# Patient Record
Sex: Female | Born: 1935 | Race: White | Hispanic: No | Smoking: Never smoker
Health system: Southern US, Community
[De-identification: ages and names within clinical notes are randomized; demographics above are authoritative.]

## PROBLEM LIST (undated history)

## (undated) DIAGNOSIS — I10 Essential (primary) hypertension: Secondary | ICD-10-CM

## (undated) DIAGNOSIS — K449 Diaphragmatic hernia without obstruction or gangrene: Secondary | ICD-10-CM

## (undated) DIAGNOSIS — J189 Pneumonia, unspecified organism: Secondary | ICD-10-CM

## (undated) DIAGNOSIS — I82409 Acute embolism and thrombosis of unspecified deep veins of unspecified lower extremity: Secondary | ICD-10-CM

## (undated) DIAGNOSIS — M1990 Unspecified osteoarthritis, unspecified site: Secondary | ICD-10-CM

## (undated) DIAGNOSIS — N200 Calculus of kidney: Secondary | ICD-10-CM

## (undated) DIAGNOSIS — I82402 Acute embolism and thrombosis of unspecified deep veins of left lower extremity: Secondary | ICD-10-CM

## (undated) DIAGNOSIS — S72002A Fracture of unspecified part of neck of left femur, initial encounter for closed fracture: Secondary | ICD-10-CM

## (undated) DIAGNOSIS — K2960 Other gastritis without bleeding: Secondary | ICD-10-CM

## (undated) DIAGNOSIS — S62109A Fracture of unspecified carpal bone, unspecified wrist, initial encounter for closed fracture: Secondary | ICD-10-CM

## (undated) DIAGNOSIS — K649 Unspecified hemorrhoids: Secondary | ICD-10-CM

## (undated) DIAGNOSIS — I509 Heart failure, unspecified: Secondary | ICD-10-CM

## (undated) HISTORY — DX: Fracture of unspecified part of neck of left femur, initial encounter for closed fracture: S72.002A

## (undated) HISTORY — PX: APPENDECTOMY: SHX54

## (undated) HISTORY — DX: Fracture of unspecified carpal bone, unspecified wrist, initial encounter for closed fracture: S62.109A

---

## 2001-11-08 ENCOUNTER — Other Ambulatory Visit: Admission: RE | Admit: 2001-11-08 | Discharge: 2001-11-08 | Payer: Self-pay | Admitting: Family Medicine

## 2001-11-12 ENCOUNTER — Encounter: Payer: Self-pay | Admitting: Family Medicine

## 2001-11-12 ENCOUNTER — Ambulatory Visit (HOSPITAL_COMMUNITY): Admission: RE | Admit: 2001-11-12 | Discharge: 2001-11-12 | Payer: Self-pay | Admitting: Family Medicine

## 2001-11-12 HISTORY — PX: COLONOSCOPY: SHX174

## 2001-11-12 HISTORY — PX: ESOPHAGOGASTRODUODENOSCOPY: SHX1529

## 2001-11-28 ENCOUNTER — Ambulatory Visit (HOSPITAL_COMMUNITY): Admission: RE | Admit: 2001-11-28 | Discharge: 2001-11-28 | Payer: Self-pay | Admitting: Internal Medicine

## 2006-10-23 ENCOUNTER — Ambulatory Visit (HOSPITAL_COMMUNITY): Admission: RE | Admit: 2006-10-23 | Discharge: 2006-10-23 | Payer: Self-pay | Admitting: Family Medicine

## 2008-02-21 ENCOUNTER — Encounter: Payer: Self-pay | Admitting: Orthopedic Surgery

## 2008-02-22 ENCOUNTER — Encounter: Payer: Self-pay | Admitting: Orthopedic Surgery

## 2008-02-24 ENCOUNTER — Ambulatory Visit: Payer: Self-pay | Admitting: Orthopedic Surgery

## 2008-02-24 DIAGNOSIS — D179 Benign lipomatous neoplasm, unspecified: Secondary | ICD-10-CM | POA: Insufficient documentation

## 2008-02-26 ENCOUNTER — Ambulatory Visit (HOSPITAL_COMMUNITY): Admission: RE | Admit: 2008-02-26 | Discharge: 2008-02-26 | Payer: Self-pay | Admitting: Internal Medicine

## 2008-03-17 ENCOUNTER — Encounter (INDEPENDENT_AMBULATORY_CARE_PROVIDER_SITE_OTHER): Payer: Self-pay | Admitting: Internal Medicine

## 2008-03-17 ENCOUNTER — Ambulatory Visit: Payer: Self-pay | Admitting: Cardiology

## 2008-03-17 ENCOUNTER — Ambulatory Visit (HOSPITAL_COMMUNITY): Admission: RE | Admit: 2008-03-17 | Discharge: 2008-03-17 | Payer: Self-pay | Admitting: Internal Medicine

## 2008-04-02 ENCOUNTER — Ambulatory Visit (HOSPITAL_COMMUNITY): Admission: RE | Admit: 2008-04-02 | Discharge: 2008-04-02 | Payer: Self-pay | Admitting: Internal Medicine

## 2008-04-15 ENCOUNTER — Ambulatory Visit (HOSPITAL_COMMUNITY): Admission: RE | Admit: 2008-04-15 | Discharge: 2008-04-15 | Payer: Self-pay | Admitting: Internal Medicine

## 2008-07-15 ENCOUNTER — Encounter: Admission: RE | Admit: 2008-07-15 | Discharge: 2008-07-15 | Payer: Self-pay | Admitting: Urology

## 2008-09-09 ENCOUNTER — Ambulatory Visit: Payer: Self-pay | Admitting: Orthopedic Surgery

## 2008-10-28 ENCOUNTER — Encounter: Admission: RE | Admit: 2008-10-28 | Discharge: 2008-10-28 | Payer: Self-pay | Admitting: Interventional Radiology

## 2008-10-28 ENCOUNTER — Encounter: Admission: RE | Admit: 2008-10-28 | Discharge: 2008-10-28 | Payer: Self-pay | Admitting: Urology

## 2009-03-10 ENCOUNTER — Ambulatory Visit (HOSPITAL_COMMUNITY): Admission: RE | Admit: 2009-03-10 | Discharge: 2009-03-10 | Payer: Self-pay | Admitting: Internal Medicine

## 2009-03-30 ENCOUNTER — Ambulatory Visit: Payer: Self-pay | Admitting: Orthopedic Surgery

## 2009-05-04 ENCOUNTER — Encounter: Admission: RE | Admit: 2009-05-04 | Discharge: 2009-05-04 | Payer: Self-pay | Admitting: Interventional Radiology

## 2009-05-04 ENCOUNTER — Encounter: Admission: RE | Admit: 2009-05-04 | Discharge: 2009-05-04 | Payer: Self-pay | Admitting: Urology

## 2009-09-19 ENCOUNTER — Inpatient Hospital Stay (HOSPITAL_COMMUNITY): Admission: EM | Admit: 2009-09-19 | Discharge: 2009-09-23 | Payer: Self-pay | Admitting: Emergency Medicine

## 2009-10-15 ENCOUNTER — Ambulatory Visit (HOSPITAL_COMMUNITY): Admission: RE | Admit: 2009-10-15 | Discharge: 2009-10-15 | Payer: Self-pay | Admitting: Internal Medicine

## 2009-10-19 ENCOUNTER — Ambulatory Visit: Payer: Self-pay | Admitting: Orthopedic Surgery

## 2009-10-21 ENCOUNTER — Ambulatory Visit (HOSPITAL_COMMUNITY): Admission: RE | Admit: 2009-10-21 | Discharge: 2009-10-21 | Payer: Self-pay | Admitting: Internal Medicine

## 2009-10-21 ENCOUNTER — Ambulatory Visit: Payer: Self-pay | Admitting: Cardiology

## 2009-11-09 ENCOUNTER — Ambulatory Visit: Payer: Self-pay | Admitting: Cardiology

## 2009-11-09 DIAGNOSIS — R609 Edema, unspecified: Secondary | ICD-10-CM

## 2009-11-09 DIAGNOSIS — R0602 Shortness of breath: Secondary | ICD-10-CM

## 2009-11-09 DIAGNOSIS — I2789 Other specified pulmonary heart diseases: Secondary | ICD-10-CM

## 2009-11-10 ENCOUNTER — Encounter: Payer: Self-pay | Admitting: Cardiology

## 2009-11-10 LAB — CONVERTED CEMR LAB
BUN: 21 mg/dL (ref 6–23)
CO2: 27 meq/L (ref 19–32)
Calcium: 11 mg/dL — ABNORMAL HIGH (ref 8.4–10.5)
Creatinine, Ser: 1.21 mg/dL — ABNORMAL HIGH (ref 0.40–1.20)
Glucose, Bld: 92 mg/dL (ref 70–99)
Sodium: 143 meq/L (ref 135–145)

## 2009-11-11 ENCOUNTER — Encounter: Payer: Self-pay | Admitting: Cardiology

## 2009-11-11 ENCOUNTER — Ambulatory Visit (HOSPITAL_COMMUNITY): Admission: RE | Admit: 2009-11-11 | Discharge: 2009-11-11 | Payer: Self-pay | Admitting: Cardiology

## 2009-11-15 ENCOUNTER — Encounter: Payer: Self-pay | Admitting: Cardiology

## 2009-11-16 ENCOUNTER — Observation Stay (HOSPITAL_COMMUNITY): Admission: RE | Admit: 2009-11-16 | Discharge: 2009-11-17 | Payer: Self-pay | Admitting: Cardiology

## 2009-11-16 ENCOUNTER — Telehealth: Payer: Self-pay | Admitting: Cardiology

## 2009-11-18 ENCOUNTER — Telehealth: Payer: Self-pay | Admitting: Cardiology

## 2009-11-18 ENCOUNTER — Encounter: Payer: Self-pay | Admitting: Cardiology

## 2009-11-18 LAB — CONVERTED CEMR LAB: POC INR: 1.2

## 2009-11-19 ENCOUNTER — Encounter: Payer: Self-pay | Admitting: Cardiology

## 2009-11-19 ENCOUNTER — Telehealth: Payer: Self-pay | Admitting: Cardiology

## 2009-11-22 ENCOUNTER — Ambulatory Visit: Payer: Self-pay | Admitting: Cardiology

## 2009-11-25 ENCOUNTER — Telehealth: Payer: Self-pay | Admitting: Cardiology

## 2009-11-25 ENCOUNTER — Encounter: Payer: Self-pay | Admitting: Cardiology

## 2009-11-25 LAB — CONVERTED CEMR LAB: Prothrombin Time: 27.9 s

## 2009-11-29 ENCOUNTER — Encounter: Payer: Self-pay | Admitting: Cardiovascular Disease

## 2009-11-29 ENCOUNTER — Telehealth: Payer: Self-pay | Admitting: Cardiovascular Disease

## 2009-11-30 ENCOUNTER — Encounter: Payer: Self-pay | Admitting: Cardiology

## 2009-12-06 ENCOUNTER — Telehealth: Payer: Self-pay | Admitting: Cardiology

## 2009-12-06 ENCOUNTER — Encounter: Payer: Self-pay | Admitting: Cardiology

## 2009-12-06 LAB — CONVERTED CEMR LAB: POC INR: 3.1

## 2009-12-20 ENCOUNTER — Ambulatory Visit: Payer: Self-pay | Admitting: Cardiology

## 2009-12-20 LAB — CONVERTED CEMR LAB: POC INR: 2.3

## 2009-12-22 ENCOUNTER — Encounter: Payer: Self-pay | Admitting: Cardiology

## 2009-12-22 ENCOUNTER — Ambulatory Visit: Payer: Self-pay | Admitting: Cardiology

## 2009-12-22 DIAGNOSIS — I82409 Acute embolism and thrombosis of unspecified deep veins of unspecified lower extremity: Secondary | ICD-10-CM | POA: Insufficient documentation

## 2010-02-15 ENCOUNTER — Inpatient Hospital Stay (HOSPITAL_COMMUNITY): Admission: EM | Admit: 2010-02-15 | Discharge: 2010-02-22 | Payer: Self-pay | Admitting: Emergency Medicine

## 2010-03-09 ENCOUNTER — Ambulatory Visit (HOSPITAL_COMMUNITY): Admission: RE | Admit: 2010-03-09 | Discharge: 2010-03-09 | Payer: Self-pay | Admitting: Urology

## 2010-03-16 ENCOUNTER — Ambulatory Visit (HOSPITAL_COMMUNITY): Admission: RE | Admit: 2010-03-16 | Discharge: 2010-03-16 | Payer: Self-pay | Admitting: Urology

## 2010-04-07 ENCOUNTER — Ambulatory Visit (HOSPITAL_COMMUNITY): Admission: RE | Admit: 2010-04-07 | Discharge: 2010-04-07 | Payer: Self-pay | Admitting: Internal Medicine

## 2010-04-13 ENCOUNTER — Encounter: Payer: Self-pay | Admitting: Cardiology

## 2010-04-25 ENCOUNTER — Ambulatory Visit (HOSPITAL_COMMUNITY): Admission: RE | Admit: 2010-04-25 | Discharge: 2010-04-25 | Payer: Self-pay | Admitting: Urology

## 2010-06-07 ENCOUNTER — Encounter: Admission: RE | Admit: 2010-06-07 | Discharge: 2010-06-07 | Payer: Self-pay | Admitting: Interventional Radiology

## 2010-06-07 ENCOUNTER — Ambulatory Visit (HOSPITAL_COMMUNITY): Admission: RE | Admit: 2010-06-07 | Discharge: 2010-06-07 | Payer: Self-pay | Admitting: Interventional Radiology

## 2010-08-14 HISTORY — PX: KIDNEY STONE SURGERY: SHX686

## 2010-08-14 HISTORY — PX: HIP OPEN REDUCTION: SHX1755

## 2010-08-19 ENCOUNTER — Ambulatory Visit
Admission: RE | Admit: 2010-08-19 | Discharge: 2010-08-19 | Payer: Self-pay | Source: Home / Self Care | Attending: Urology | Admitting: Urology

## 2010-08-19 LAB — POCT I-STAT 4, (NA,K, GLUC, HGB,HCT)
Glucose, Bld: 87 mg/dL (ref 70–99)
HCT: 41 % (ref 36.0–46.0)
Hemoglobin: 13.9 g/dL (ref 12.0–15.0)
Potassium: 3.9 mEq/L (ref 3.5–5.1)
Sodium: 142 mEq/L (ref 135–145)

## 2010-08-19 LAB — PROTIME-INR
INR: 0.87 (ref 0.00–1.49)
Prothrombin Time: 12 seconds (ref 11.6–15.2)

## 2010-09-05 ENCOUNTER — Encounter: Payer: Self-pay | Admitting: Internal Medicine

## 2010-09-13 NOTE — Progress Notes (Signed)
Summary: coumadin management  Phone Note Other Incoming   Caller: Verlon Au RN [facility] Reason for Call: Discuss lab or test results Summary of Call: Called with results of PT/INR obtained on pt today.  PT 30.7  INR 3.1  Order given for pt to decrease dose to 7.5mg  once daily except 5mg  on Mondays and pt to have INR rechecked in the office on 12/20/09.  Home Health discharged pt today. Initial call taken by: Vashti Hey RN,  December 06, 2009 12:58 PM     Anticoagulant Therapy  Managed by: Vashti Hey RN PCP: Dr. Catalina Pizza Supervising MD: Diona Browner MD, Remi Deter Indication 1: DVT Lab Used: [facility] [place] Site: [place] PT 37.0 INR POC 3.1  Dietary changes: no    Health status changes: no    Bleeding/hemorrhagic complications: no    Recent/future hospitalizations: no    Any changes in medication regimen? no    Recent/future dental: no  Any missed doses?: no       Is patient compliant with meds? yes         Anticoagulation Management History:      Her anticoagulation is being managed by telephone today.  The patient is taking warfarin for the first episode of deep venous thrombosis and/or pulmonary embolism.  Positive risk factors for bleeding include an age of 75 years or older.  Negative risk factors for bleeding include no history of CVA/TIA, no history of GI bleeding, and absence of serious comorbidities.  The bleeding index is 'intermediate risk'.  Positive CHADS2 values include Age > 12 years old.  Negative CHADS2 values include History of CHF, History of HTN, History of Diabetes, and Prior Stroke/CVA/TIA.  The start date was 11/17/2009.  Anticipated length of treatment is 3-6 months.  Prothrombin time is 37.0.  Anticoagulation responsible provider: Diona Browner MD, Remi Deter.  INR POC: 3.1.    Anticoagulation Management Assessment/Plan:      The patient's current anticoagulation dose is Warfarin sodium 5 mg tabs: Use as directed by Anticoagulation Clinic.  The target  INR is 2.0-3.0.  The next INR is due 12/20/2009.  Anticoagulation instructions were given to [facility].  Results were reviewed/authorized by Vashti Hey RN.  She was notified by [facility].         Prior Anticoagulation Instructions: Called with results of INR obtained on pt today.  INR 2.4  Order given for pt to continue coumadin 7.5mg  once daily and [facility] to recheck INR on 12/06/09.  Current Anticoagulation Instructions: Called with results of PT/INR obtained on pt today.  PT 30.7  INR 3.1  Order given for pt to decrease dose to 7.5mg  once daily except 5mg  on Mondays and pt to have INR rechecked in the office on 12/20/09.  Home Health discharged pt today.

## 2010-09-13 NOTE — Progress Notes (Signed)
Summary: coumadin management  Phone Note Other Incoming   Caller: Alexis Frock RN [facility] Reason for Call: Discuss lab or test results Summary of Call: Called with results of INR obtained on pt today.  INR 2.4  Order given for pt to continue coumadin 7.5mg  once daily and [facility] to recheck INR on 12/06/09. Initial call taken by: Vashti Hey RN,  November 29, 2009 2:40 PM     Anticoagulant Therapy  Managed by: Vashti Hey RN PCP: Dr. Catalina Pizza Supervising MD: Eden Emms MD, Theron Arista Indication 1: DVT Lab Used: [facility] [place] Site: [place] [facility]  Dietary changes: no    Health status changes: no    Bleeding/hemorrhagic complications: no    Recent/future hospitalizations: no    Any changes in medication regimen? no    Recent/future dental: no  Any missed doses?: no       Is patient compliant with meds? yes         Anticoagulation Management History:      Her anticoagulation is being managed by telephone today.  The patient is taking warfarin for the first episode of deep venous thrombosis and/or pulmonary embolism.  Positive risk factors for bleeding include an age of 37 years or older.  Negative risk factors for bleeding include no history of CVA/TIA, no history of GI bleeding, and absence of serious comorbidities.  The bleeding index is 'intermediate risk'.  Positive CHADS2 values include Age > 46 years old.  Negative CHADS2 values include History of CHF, History of HTN, History of Diabetes, and Prior Stroke/CVA/TIA.  The start date was 11/17/2009.  Anticipated length of treatment is 3-6 months.  Anticoagulation responsible provider: Eden Emms MD, Theron Arista.  Cuvette Lot#: [id number].    Anticoagulation Management Assessment/Plan:      The patient's current anticoagulation dose is Warfarin sodium 5 mg tabs: Use as directed by Anticoagulation Clinic.  The target INR is 2.0-3.0.  The next INR is due 12/06/2009.  Anticoagulation instructions were given to Alexis Frock RN [facility].  Results were  reviewed/authorized by Vashti Hey RN.  She was notified by Alexis Frock RN [facility].         Prior Anticoagulation Instructions: INR 2.3 Continue coumadin 7.5mg  once daily   Current Anticoagulation Instructions: Called with results of INR obtained on pt today.  INR 2.4  Order given for pt to continue coumadin 7.5mg  once daily and [facility] to recheck INR on 12/06/09.

## 2010-09-13 NOTE — Assessment & Plan Note (Signed)
Summary: **NP6 CHF   Visit Type:  Initial Consult Primary Provider:  Dr. Catalina Pizza   History of Present Illness: 77 year old woman referred for cardiology consultation. I reviewed her recent medical history including influenza with upper respiratory infection back in February, associated with acute E coli pyelonephritis and acute renal insufficiency. Since that time she has had a slow recovery, and in fact early on continued to experience dyspnea on exertion to the degree of NYHA class III. She has also had worsening lower extremity edema on a baseline chronic history of lower edema, somewhat more asymmetric, left greater than right, and with some associated erythema. This is despite an increase in her diuretic regimen.  She was referred by Dr. Margo Aye for a followup chest x-ray in early March demonstrating a decrease in atelectasis and small bilateral pleural effusions. A 2-D echocardiogram was then obtained revealing moderate left ventricular hypertrophy with an LVEF of 60-65%, moderate septal flattening consistent with right ventricular pressure and volume overload, moderately dilated right ventricle with mildly reduced function, and pulmonary hypertension, with a PASP of 59 mm mercury. A minimal pericardial effusion was also noted. We discussed these results today  Holly Sloan does indicate that she has had lower extremitiy edema for many years, although not this significant before. She denies any history of venous thromboembolism. She is not aware of any baseline chronic lung disease and has no history of tobacco use. She has had no recent cough or hemoptysis.   Current Medications (verified): 1)  Valturna 300-320 Mg Tabs (Aliskiren-Valsartan) .... Take 1 Tablet By Mouth Once A Day 2)  Klor-Con M20 20 Meq Cr-Tabs (Potassium Chloride Crys Cr) .... Take 1 Tablet By Mouth Once A Day 3)  Amlodipine Besylate 2.5 Mg Tabs (Amlodipine Besylate) .... Take One Tablet By Mouth Daily 4)  Furosemide 40 Mg  Tabs (Furosemide) .... Take One Tablet By Mouth Daily. 5)  Tylenol Extra Strength 500 Mg Tabs (Acetaminophen) .... Take One Tablet By Mouth Every 4 Hrs As Needed 6)  Ibuprofen 200 Mg Tabs (Ibuprofen) .... Take One Tablet By Mouth As Needed Every 6 Hrs  Allergies (verified): No Known Drug Allergies  Past History:  Past Medical History: Last updated: 11/08/2009 Hypertension Renal insufficiency Recent influenza with upper respiratory infection, February 2001 Escherichia coli pyelonephritis, February 2001 Hiatal hernia Chronic lower extremity edema  Family History: Last updated: 11/09/2009 Noncontributory Father: deceasesed 79's MI, cirrhosis Mother:deceased 58 yrs old-old age Siblings: 1- brother 83- stent in 11/10  Social History: Last updated: 11/09/2009 Full Time - part time Statistician Tobacco Use - No Alcohol Use - no  Past Surgical History: Snare polypectomy 2003 appendectomy-1950  Clinical Review Panels:  CXR CXR results   Clinical Data: Shortness of breath.  Dyspnea on exertion.    CHEST - 2 VIEW    Comparison: 09/22/2009    Findings: Asymmetric elevation of the right hemidiaphragm is   stable.  Small bilateral pleural effusions noted with associated   basilar atelectasis.  The right PICC line seen previously has been   removed in the interval. The cardiopericardial silhouette is   enlarged.    IMPRESSION:   Interval decrease in bibasilar atelectasis and bilateral pleural   effusions.    Read By:  Kennith Center,  M.D.   Released By:  Kennith Center,  M.D.  Additional Information  HL7 RESULT STATUS : F  External image : [id number]  External IF Update Timestamp : 2009-10-15:18:51:[id number] (10/15/2009)  Echocardiogram Echocardiogram   Study Conclusions    -  Left ventricle: The cavity size was normal. Wall thickness was     increased in a pattern of moderate LVH. Systolic function was     normal. The estimated ejection fraction was  65%, in the range of     60% to 65%.   - Ventricular septum: The contour showed moderate diastolic     flattening and moderate systolic flattening.   - Aortic valve: Mildly calcified annulus. Trileaflet; mildly     calcified leaflets. Sclerosis without stenosis.   - Left atrium: The atrium was mildly dilated.   - Right ventricle: The cavity size was moderately dilated. Systolic     function was mildly reduced.   - Pulmonary arteries: Systolic pressure was moderately increased. PA     peak pressure: 59mm Hg (S).   - Pericardium, extracardiac: Minimalfree-flowing pericardial     effusion posterior to the heart withoutinternal echoes.   Transthoracic echocardiography. M-mode, complete 2D, spectral   Doppler, and color Doppler. Height: Height: 165.1cm. Height: 65in.   Weight: Weight: 93.4kg. Weight: 205.6lb. Body mass index: BMI:   34.3kg/m^2. Body surface area: BSA: 56m^2. Patient status:   Outpatient. Location: Echo laboratory.    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  The patient denies anorexia, fever, chest pain, syncope, prolonged cough, hemoptysis, melena, hematochezia, and severe indigestion/heartburn.         Otherwise reviewed and negative except as outlined above.  Vital Signs:  Patient profile:   77 year old female Height:      65 inches Weight:      199 pounds BMI:     33.24 O2 Sat:      98 % on Room air Pulse rate:   82 / minute BP sitting:   137 / 78  (left arm)  Vitals Entered By: Teressa Lower RN (November 09, 2009 1:41 PM)  O2 Flow:  Room air  Physical Exam  Additional Exam:  Obese  woman in no acute distress. Not hypoxic on room air. HEENT: Conjunctiva and lids normal, oropharynx with moist because it. Neck: Supple, no obvious elevated jugular venous pressure or carotid bruits. Lungs: Generally clear to auscultation, diminished at the very bases, no rails or wheezing. Cardiac: Regular rate and rhythm, soft systolic murmur near the apex, indistinct PMI, no pericardial rub or S3. Abdomen: Soft, nontender, no bruits. Extremities: Chronic appearing edema, left worse than right with associated mild erythema, distal pulses one plus. Skin: Otherwise warm and dry. Musculoskeletal: No kyphosis. Neuropsychiatric: Alert and oriented x3, affect appropriate.   EKG  Procedure date:  11/09/2009  Findings:      Normal sinus rhythm at 76 beats per minute with leftward axis and anterolateral T-wave inversions with nonspecific T-wave changes.  Impression & Recommendations:  Problem # 1:  DYSPNEA (ICD-786.05)  Shortness of breath as noted above, with recent documentation of at least moderately elevated pulmonary systolic pressures and right ventricular dysfunction. Left ventricular systolic function is normal without other major valvular abnormalities. Symptomatically she seems to be improving, and one wonders how much of this could have been related to her acute illness in February. Her longer standing history of lower extremity edema, however raises the question  of longer standing pulmonary hypertension. We discussed these issues today and I reviewed this with Dr. Margo Aye. At this point we will plan lower extremity venous studies to exclude deep venous thrombosis, chest CT with contrast to exclude thromboembolic disease and other obvious pulmonary parenchymal disease, as well as pulmonary function tests. We will have her return to the office to review this and discuss the next step.  Orders: CT with Contrast (CT w/ contrast)  Her updated medication list for this problem includes:     Amlodipine Besylate 2.5 Mg Tabs (Amlodipine besylate) .Marland Kitchen... Take one tablet by mouth daily    Furosemide 40 Mg Tabs (Furosemide) .Marland Kitchen... Take one tablet by mouth daily.  Problem # 2:  HYPERTENSION, PULMONARY (ICD-416.8)  As noted above, we will proceed with further testing to determine whether this is a primary or secondary issue.  Orders: Venous Duplex Lower Extremity (Venous Dup Lower E)  Problem # 3:  EDEMA (ICD-782.3)  Most likely related to right ventricular dysfunction with pulmonary hypertension, perhaps some venous insufficiency as well. As noted above, will exclude deep venous thrombosis with venous Dopplers. She will otherwise continue on her diuretic regimen as directed by Dr. Margo Aye.  Orders: T-Basic Metabolic Panel [phone])  Other Orders: Pulmonary Function Test (PFT)  Patient Instructions: 1)  Your physician recommends that you schedule a follow-up appointment in: 3 to 4 weeks 2)  Your physician recommends that you return for lab work in: today 3)  Your physician has requested that you have a lower extremity venous duplex.  This test is an ultrasound of the veins in the legs or arms.  It looks at venous blood flow that carries blood from the heart to the legs or arms.  Allow one hour for a Lower Venous exam.  Allow thirty minutes for an Upper Venous exam. There are no restrictions or special instructions. 4)  Your physician has requested that you have a cardiac CT.  Cardiac computed tomography (CT) is a painless test that uses an x-ray machine to take clear, detailed pictures of your heart.  For further information please visit [email or web address]  Please follow instruction sheet as given. 5)  Your physician has recommended that you have a pulmonary function test.  Pulmonary Function Tests are a group of tests that measure how well air moves in and out of your lungs.

## 2010-09-13 NOTE — Medication Information (Signed)
Summary: Coumadin Clinic  Anticoagulant Therapy  Managed by: Inactive PCP: Dr. Catalina Pizza Supervising MD: Dietrich Pates MD, Molly Maduro Indication 1: DVT Lab Used: [facility]  Site: [facility]          Comments: Dr Margo Aye will start managing pt's coumadin.  Called and set up with his office and pt notified.  Allergies: No Known Drug Allergies  Anticoagulation Management History:      The patient is on warfarin for the first episode of deep venous thrombosis and/or pulmonary embolism.  Positive risk factors for bleeding include an age of 75 years or older.  Negative risk factors for bleeding include no history of CVA/TIA, no history of GI bleeding, and absence of serious comorbidities.  The bleeding index is 'intermediate risk'.  Positive CHADS2 values include Age > 75 years old.  Negative CHADS2 values include History of CHF, History of HTN, History of Diabetes, and Prior Stroke/CVA/TIA.  The start date was 11/17/2009.  Anticipated length of treatment is 3-6 months.  Anticoagulation responsible provider: Dietrich Pates MD, Molly Maduro.    Anticoagulation Management Assessment/Plan:      The patient's current anticoagulation dose is Warfarin sodium 5 mg tabs: Use as directed by Anticoagulation Clinic.  The target INR is 2.0-3.0.  The next INR is due 01/06/2010.  Anticoagulation instructions were given to patient.  Results were reviewed/authorized by Inactive.         Prior Anticoagulation Instructions: INR 2.3 Continue coumadin 7.5mg  once daily except 5mg  on Mondays

## 2010-09-13 NOTE — Letter (Signed)
Summary: advanced home care order  advanced home care order   Imported By: Faythe Ghee 04/13/2010 16:30:07  _____________________________________________________________________  External Attachment:    Type:   Image     Comment:   External Document

## 2010-09-13 NOTE — Assessment & Plan Note (Signed)
Summary: 3-4 wk f/u per checkout on 11/09/09/tg   Visit Type:  Follow-up Primary Provider:  Dr. Catalina Pizza   History of Present Illness: 77 year old woman presents for followup. She was hospitalized back in April for treatment of a left lower extremity DVT. This was discovered based on testing that was arranged to further assess pulmonary hypertension and also lower extremity edema.  She is now on Coumadin, with recent INR of 2.3 in the Coumadin clinic, tolerating it without bleeding, and has less lower extremity edema. She reports no problems with significant shortness of breath or chest pain.  I reviewed with her the results of her CT scan of the chest, pulmonary function tests, and obviously her lower extremity venous studies. I suspect that her elevated pulmonary pressures may well be related to her episode of influenza with respiratory difficulty back in February, also possibly a component of diastolic dysfunction. Main focus at this time should the blood pressure control and volume control. It is not clear that she ever had a pulmonary embolus, although certainly within the realm of possibility given her deep venous thrombosis.  Clinical Review Panels:  Echocardiogram Echocardiogram   Study Conclusions    - Left ventricle: The cavity size was normal. Wall thickness was     increased in a pattern of moderate LVH. Systolic function was     normal. The estimated ejection fraction was 65%, in the range of     60% to 65%.   - Ventricular septum: The contour showed moderate diastolic     flattening and moderate systolic flattening.   - Aortic valve: Mildly calcified annulus. Trileaflet; mildly     calcified leaflets. Sclerosis without stenosis.   - Left atrium: The atrium was mildly dilated.   - Right ventricle: The cavity size was moderately dilated. Systolic     function was mildly reduced.   - Pulmonary arteries: Systolic pressure was moderately increased. PA     peak pressure: 59mm  Hg (S).   - Pericardium, extracardiac: Minimalfree-flowing pericardial     effusion posterior to the heart withoutinternal echoes.   Transthoracic echocardiography. M-mode, complete 2D, spectral   Doppler, and color Doppler. Height: Height: 165.1cm. Height: 65in.   Weight: Weight: 93.4kg. Weight: 205.6lb. Body mass index: BMI:   34.3kg/m^2. Body surface area: BSA: 3m^2. Patient status:   Outpatient. Location: Echo laboratory.    -------------------------------------------------------------------- (10/21/2009)    Current Medications (verified): 1)  Valturna 300-320 Mg Tabs (Aliskiren-Valsartan) .... Take 1 Tablet By Mouth Once A Day 2)  Klor-Con M20 20 Meq Cr-Tabs (Potassium Chloride Crys Cr) .... Take 1 Tablet By Mouth Once A Day 3)  Furosemide 40 Mg Tabs (Furosemide) .... Take One Tablet By Mouth Two Times A Day 4)  Tylenol Extra Strength 500 Mg Tabs (Acetaminophen) .... Take One Tablet By Mouth Every 4 Hrs As Needed 5)  Warfarin Sodium 5 Mg Tabs (Warfarin Sodium) .... Use As Directed By Anticoagulation Clinic 6)  Calcium-D 600-200 Mg-Unit Caps (Calcium Carbonate-Vitamin D) .... 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  Otherwise reviewed and negative.  Vital Signs:  Patient profile:   77 year old female Weight:      199 pounds Pulse rate:   75 / minute BP sitting:   134 / 87  (right arm)  Vitals Entered  By: Dreama Saa, CNA (Dec 22, 2009 11:48 AM)  Physical Exam  Additional Exam:  Obese woman in no acute distress. Not hypoxic on room air. HEENT: Conjunctiva and lids normal, oropharynx with moist because it. Neck: Supple, no obvious elevated jugular venous pressure or carotid bruits. Lungs: Generally clear to auscultation, diminished at the very bases, no rails or wheezing. Cardiac: Regular rate and rhythm, soft systolic murmur near the apex, indistinct PMI, no pericardial rub or S3. Abdomen: Soft, nontender, no bruits. Extremities: Chronic appearing edema, left worse than right with associated mild erythema, improving.    Venous Doppler  Procedure date:  11/16/2009  Findings:      IMPRESSION:   No evidence of deep venous thrombosis in the right lower extremity.    Deep venous thrombosis is present in the LEFT lower extremity   involving the left popliteal vein, extending into the proximal left   calf veins.  CT Scan  Procedure date:  11/16/2009  Findings:      Comparison:  None    Findings:   Minimal atherosclerotic calcification aorta.   No aortic aneurysm or dissection.   Pulmonary arteries patent.   No evidence of pulmonary embolism.   No acute upper abdominal abnormalities identified.   Scattered atelectasis and minimal peribronchial thickening.   Minimal scattered nonspecific ground glass attenuation, can be seen   with alveolitis.   No segmental consolidation, pneumothorax or mass.   Tiny left pleural effusion.   Scattered end plate spur formation spine.   Eventration right diaphragm.    Review of the MIP images confirms the above findings.    IMPRESSION:   No evidence pulmonary embolism.   Scattered atelectasis with minimal nonspecific ground-glass   attenuation in the lungs, question alveolitis/infection.   Tiny left pleural effusio  PFT  Procedure date:  11/11/2009  Findings:      Normal spirometry. DLCO is borderline reduced at 78% predicted.  Blood gas is normal. Lung volume determination could not be done.  Impression & Recommendations:  Problem # 1:  DVT (ICD-453.40)  Patient on Coumadin with diagnosis of left lower extremity deep venous thrombosis. She has been followed in our Coumadin clinic, with INR of 2.3 this week. She voices interest in having this followed through Dr. Scharlene Gloss office for convenience, and will be following with him long-term anyway. We will make these arrangements. Anticipate 3-6 months of therapy.  Problem # 2:  HYPERTENSION, PULMONARY (ICD-416.8)  Likely a secondary phenomenon, in the setting of influenza with respiratory difficulty back in February, possibly component of diastolic dysfunction as well. No clearly proven pulmonary embolus, and pulmonary function tests were largely nonrevealing. Would suggest efforts at careful blood pressure control and volume management. She is tolerating her present regimen, and reports no worsening shortness of breath. We can see her back as needed.  Patient Instructions: 1)  Your physician recommends that you schedule a follow-up appointment in: as needed

## 2010-09-13 NOTE — Assessment & Plan Note (Signed)
Summary: 6 MO RECK XR LEFT ARM/HUMANA/BSF   Visit Type:  Follow-up  CC:  left upper arm.  History of Present Illness: I saw Holly Sloan in the office today for a 6 month followup visit.  She is a 77 years old woman with the complaint of:  lipoma, left upper arm.  Patient states her arm is doing fine.  No change in size, and clinical exam, Mass. continues to be in the subcutaneous tissue was mobile and firm.  No neurovascular deficits.  No lymph nodes in the axilla.    Allergies: No Known Drug Allergies   Impression & Recommendations:  Problem # 1:  LIPOMA (ICD-214.9) Assessment Comment Only  following the patient for over a year now. No problems. Recommend followup as needed  Orders: Est. Patient Level II ([address])  Patient Instructions: 1)  Please schedule a follow-up appointment as needed.

## 2010-09-13 NOTE — Medication Information (Signed)
Summary: NEW TO COUMADIN POST [facility] [place]/[facility]  Anticoagulant Therapy  Managed by: Vashti Hey RN PCP: Dr. Catalina Pizza Supervising MD: Dietrich Pates MD, Molly Maduro Indication 1: DVT Lab Used: [facility] [place] Site:  INR POC 1.9  Dietary changes: no    Health status changes: no    Bleeding/hemorrhagic complications: no    Recent/future hospitalizations: yes       Details: Discharged [facility] 11/17/09 for DVT lt leg  Any changes in medication regimen? yes       Details: started on Lovenonx  and  coumadin   Recent/future dental: no  Any missed doses?: no       Is patient compliant with meds? yes      Comments: Last dose of Lovenox today  Allergies: No Known Drug Allergies  Anticoagulation Management History:      The patient is taking warfarin and comes in today for a routine follow up visit.  The patient is taking warfarin for the first episode of deep venous thrombosis and/or pulmonary embolism.  Positive risk factors for bleeding include an age of 75 years or older.  Negative risk factors for bleeding include no history of CVA/TIA, no history of GI bleeding, and absence of serious comorbidities.  The bleeding index is 'intermediate risk'.  Positive CHADS2 values include Age > 19 years old.  Negative CHADS2 values include History of CHF, History of HTN, History of Diabetes, and Prior Stroke/CVA/TIA.  The start date was 11/17/2009.  Anticipated length of treatment is 3-6 months.  Anticoagulation responsible provider: Dietrich Pates MD, Molly Maduro.  INR POC: 1.9.  Cuvette Lot#: [id number].    Anticoagulation Management Assessment/Plan:      The patient's current anticoagulation dose is Warfarin sodium 5 mg tabs: Use as directed by Anticoagulation Clinic.  The target INR is 2.0-3.0.  The next INR is due 11/25/2009.  Anticoagulation instructions were given to patient.  Results were reviewed/authorized by Vashti Hey RN.  She was notified by Vashti Hey RN.         Prior Anticoagulation  Instructions: Called with results of INR obtained on pt today.  INR 1.7  Pt still on Lovenox 120mg  daily.  Order given for pt to take coumadin 7.5mg  tonight then 5mg  on Sat and Sunday and recheck INR on 11/22/09 in the [place] office.  Current Anticoagulation Instructions: INR 1.9 Take coumadin 2 tablets tonight then take 1 1/2 tablets on Tuesdays and Wednesday.  Home Health will recheck INR on Thursday. Stop Lovenox after todays injection. Prescriptions: WARFARIN SODIUM 5 MG TABS (WARFARIN SODIUM) Use as directed by Anticoagulation Clinic  #60 x 3   Entered by:   Lisa Reid RN   Authorized by:   Samuel Grainger McDowell, MD, FACC   Signed by:   Lisa Reid RN on 11/22/2009   Method used:   Electronically to        [facility]* (retail)       [address]       [place], [place]  [address]       Ph: [phone]       Fax: [phone]   RxID:   [id number]

## 2010-09-13 NOTE — Letter (Signed)
Summary: PFT  PFT   Imported By: Faythe Ghee 11/30/2009 16:20:33  _____________________________________________________________________  External Attachment:    Type:   Image     Comment:   External Document

## 2010-09-13 NOTE — Letter (Signed)
Summary: progress note  progress note   Imported By: Faythe Ghee 11/10/2009 14:45:34  _____________________________________________________________________  External Attachment:    Type:   Image     Comment:   External Document

## 2010-09-13 NOTE — Progress Notes (Signed)
Summary: coumadin management  Phone Note Other Incoming   Caller: Verlon Au RN [facility] Reason for Call: Discuss lab or test results Summary of Call: Called with results of INR obtained on pt today.  INR 1.7  Pt still on Lovenox 120mg  daily.  Order given for pt to take coumadin 7.5mg  tonight then 5mg  on Sat and Sunday and recheck INR on 11/22/09 in the [place] office. Initial call taken by: Vashti Hey RN,  November 19, 2009 2:36 PM     Anticoagulant Therapy  Managed by: Vashti Hey RN PCP: Dr. Catalina Pizza Supervising MD: Dietrich Pates MD, Molly Maduro Indication 1: DVT Lab Used: [facility] [place] Site:  INR POC 1.7  Dietary changes: no    Health status changes: no    Bleeding/hemorrhagic complications: no    Recent/future hospitalizations: no    Any changes in medication regimen? no    Recent/future dental: no  Any missed doses?: no       Is patient compliant with meds? yes         Anticoagulation Management History:      Her anticoagulation is being managed by telephone today.  Positive risk factors for bleeding include an age of 75 years or older.  The bleeding index is 'intermediate risk'.  Positive CHADS2 values include Age > 27 years old.  Anticoagulation responsible provider: Dietrich Pates MD, Molly Maduro.  INR POC: 1.7.    Anticoagulation Management Assessment/Plan:      The target INR is 2.0-3.0.  The next INR is due 11/22/2009.  Anticoagulation instructions were given to [facility].  Results were reviewed/authorized by Vashti Hey RN.  She was notified by Arther Dames [facility].         Current Anticoagulation Instructions: Called with results of INR obtained on pt today.  INR 1.7  Pt still on Lovenox 120mg  daily.  Order given for pt to take coumadin 7.5mg  tonight then 5mg  on Sat and Sunday and recheck INR on 11/22/09 in the [place] office.

## 2010-09-13 NOTE — Letter (Signed)
Summary: labs  labs   Imported By: Faythe Ghee 11/10/2009 14:47:19  _____________________________________________________________________  External Attachment:    Type:   Image     Comment:   External Document

## 2010-09-13 NOTE — Progress Notes (Signed)
Summary: coumadin management  Phone Note Other Incoming   Caller: Verlon Au RN [facility] Reason for Call: Discuss lab or test results Summary of Call: Called with results of PT/INR obtained on pt today.  PT 27.9  INR 2.3  Order given for pt to continue coumadin 7.5mg  once daily and [facility] to recheck INR on 11/29/09 and call results to office. Initial call taken by: Vashti Hey RN,  November 25, 2009 1:24 PM     Anticoagulant Therapy  Managed by: Vashti Hey RN PCP: Dr. Catalina Pizza Supervising MD: Dietrich Pates MD, Molly Maduro Indication 1: DVT Lab Used: [facility] [place] Site:  PT 27.9 INR POC 2.3  Dietary changes: no    Health status changes: no    Bleeding/hemorrhagic complications: no    Recent/future hospitalizations: no    Any changes in medication regimen? no    Recent/future dental: no  Any missed doses?: no       Is patient compliant with meds? yes         Anticoagulation Management History:      Her anticoagulation is being managed by telephone today.  The patient is taking warfarin for the first episode of deep venous thrombosis and/or pulmonary embolism.  Positive risk factors for bleeding include an age of 75 years or older.  Negative risk factors for bleeding include no history of CVA/TIA, no history of GI bleeding, and absence of serious comorbidities.  The bleeding index is 'intermediate risk'.  Positive CHADS2 values include Age > 50 years old.  Negative CHADS2 values include History of CHF, History of HTN, History of Diabetes, and Prior Stroke/CVA/TIA.  The start date was 11/17/2009.  Anticipated length of treatment is 3-6 months.  Prothrombin time is 27.9.  Anticoagulation responsible provider: Dietrich Pates MD, Molly Maduro.  INR POC: 2.3.    Anticoagulation Management Assessment/Plan:      The patient's current anticoagulation dose is Warfarin sodium 5 mg tabs: Use as directed by Anticoagulation Clinic.  The target INR is 2.0-3.0.  The next INR is due 11/29/2009.   Anticoagulation instructions were given to patient.  Results were reviewed/authorized by Vashti Hey RN.  She was notified by Vashti Hey RN.         Prior Anticoagulation Instructions: INR 1.9 Take coumadin 2 tablets tonight then take 1 1/2 tablets on Tuesdays and Wednesday.  Home Health will recheck INR on Thursday. Stop Lovenox after todays injection.  Current Anticoagulation Instructions: INR 2.3 Continue coumadin 7.5mg  once daily

## 2010-09-13 NOTE — Progress Notes (Signed)
Summary: urgent results  Phone Note Other Incoming   Caller: Dr. Tyron Russell, radiologist Summary of Call: Radiologist called pt has DVT  popliteal down calf, Pt is waitiing in Radiology for further orders.  Awaiting results to scan into record Initial call taken by: Teressa Lower RN,  November 16, 2009 2:39 PM  Follow-up for Phone Call        Discussed with Tammy and results to be given to Dr. Margo Aye - patient will need treament, Coumadin. Follow-up by: Loreli Slot, MD, [facility],  November 16, 2009 3:09 PM  Additional Follow-up for Phone Call Additional follow up Details #1::        Sent results to Dr. Margo Aye, he plans to have hospitalist group admit pt for anticoagulation therapy Additional Follow-up by: Teressa Lower RN,  November 16, 2009 3:14 PM      Venous Doppler  Procedure date:  11/16/2009  Findings:       IMPRESSION:   No evidence of deep venous thrombosis in the right lower extremity.    Deep venous thrombosis is present in the LEFT lower extremity   involving the left popliteal vein, extending into the proximal left   calf veins.    Findings called to Tammy RN on 11/16/2009 at 1435 hours.    Read By:  Lollie Marrow,  M.D.   Released By:  Lollie Marrow,  Judie Petit.D.

## 2010-09-13 NOTE — Medication Information (Signed)
Summary: ccr-lr  Anticoagulant Therapy  Managed by: Vashti Hey RN PCP: Dr. Catalina Pizza Supervising MD: Dietrich Pates MD, Molly Maduro Indication 1: DVT Lab Used: [facility] [place] Site: [place] INR POC 2.3  Dietary changes: no    Health status changes: no    Bleeding/hemorrhagic complications: no    Recent/future hospitalizations: no    Any changes in medication regimen? no    Recent/future dental: no  Any missed doses?: no       Is patient compliant with meds? yes       Allergies: No Known Drug Allergies  Anticoagulation Management History:      The patient is taking warfarin and comes in today for a routine follow up visit.  The patient is taking warfarin for the first episode of deep venous thrombosis and/or pulmonary embolism.  Positive risk factors for bleeding include an age of 59 years or older.  Negative risk factors for bleeding include no history of CVA/TIA, no history of GI bleeding, and absence of serious comorbidities.  The bleeding index is 'intermediate risk'.  Positive CHADS2 values include Age > 33 years old.  Negative CHADS2 values include History of CHF, History of HTN, History of Diabetes, and Prior Stroke/CVA/TIA.  The start date was 11/17/2009.  Anticipated length of treatment is 3-6 months.  Anticoagulation responsible provider: Dietrich Pates MD, Molly Maduro.  INR POC: 2.3.  Cuvette Lot#: [id number].    Anticoagulation Management Assessment/Plan:      The patient's current anticoagulation dose is Warfarin sodium 5 mg tabs: Use as directed by Anticoagulation Clinic.  The target INR is 2.0-3.0.  The next INR is due 01/06/2010.  Anticoagulation instructions were given to patient.  Results were reviewed/authorized by Vashti Hey RN.  She was notified by Vashti Hey RN.        Coagulation management information includes: Need coumadin 3-60-months for DVT   Started coumadin 11/17/09 in hospital .  Prior Anticoagulation Instructions: Called with results of PT/INR obtained on  pt today.  PT 30.7  INR 3.1  Order given for pt to decrease dose to 7.5mg  once daily except 5mg  on Mondays and pt to have INR rechecked in the office on 12/20/09.  Home Health discharged pt today.  Current Anticoagulation Instructions: INR 2.3 Continue coumadin 7.5mg  once daily except 5mg  on Mondays

## 2010-09-13 NOTE — Progress Notes (Signed)
Summary: coumadin management  Phone Note Other Incoming Call back at [phone]   Caller: Angelique Blonder w/ [facility] Summary of Call: Angelique Blonder called to let you know that she admitted pt today/states patient has appt. Monday for PT check/today it was PT - 14.1 and INR - 1.2/patient is on Lovenox 120 mg x 4 days ending Sunday/pt is also taking Coumadin 10 mg yesterday and today and will take 5 mg once daily after today/pt had left leg DVT/pls let [facility] know if you would like for them to continue to do PT/INR's/pls call Angelique Blonder if you have any questions/tg Initial call taken by: Raechel Ache [facility],  November 18, 2009 4:19 PM  Follow-up for Phone Call        [facility] back.  Order given for [facility] to recheck INR tomorrow and call results since pt is on Lovenox 120mg  once daily. Follow-up by: Vashti Hey RN,  November 18, 2009 4:29 PM     Anticoagulant Therapy  Managed by: Vashti Hey RN PCP: Dr. Catalina Pizza Supervising MD: Dietrich Pates MD, Molly Maduro Indication 1: DVT Lab Used: [facility] [place] Site: [place] INR POC 1.2  Dietary changes: no    Health status changes: yes       Details: DVT  Bleeding/hemorrhagic complications: no    Recent/future hospitalizations: yes       Details: In [facility] for DVT in Lt leg  In from 11/16/09- 11/17/09  Any changes in medication regimen? yes       Details: Started on coumadin 10mg  x 2 and on Lovenox 120mg  qd   Recent/future dental: no  Any missed doses?: no       Is patient compliant with meds? yes      Comments: [facility] seeing pt.  Order given for them to recheck INR on 11/19/09 and call results to office since pt is on Lovenox.    Anticoagulation Management History:      Her anticoagulation is being managed by telephone today.  Positive risk factors for bleeding include an age of 75 years or older.  The bleeding index is 'intermediate risk'.  Positive CHADS2 values include Age > 75 years old.  Anticoagulation responsible provider: Dietrich Pates MD, Molly Maduro.   INR POC: 1.2.    Anticoagulation Management Assessment/Plan:      The target INR is 2.0-3.0.  The next INR is due 11/19/2009.  Anticoagulation instructions were given to Lelon Huh RN [facility].  Results were reviewed/authorized by Vashti Hey RN.  She was notified by Lelon Huh RN [facility].        Coagulation management information includes: Need coumadin 3-6-months for DVT.

## 2010-09-13 NOTE — Letter (Signed)
Summary: [place] [employer], agricultural at [facility]  [address] [place] [address]   Phone: [phone]  Fax: [phone]      November 15, 2009 MRN: [id number]   [facility] [address] [place], [place]  [address]   Dear Ms. Graser,  Your test ordered by Selena Batten has been reviewed by your physician (or physician assistant) and was found to be normal or stable. Your physician (or physician assistant) felt no changes were needed at this time.  ____ Echocardiogram  ____ Cardiac Stress Test  __X__ Lab Work  ____ Peripheral vascular study of arms, legs or neck  ____ CT scan or X-ray  ____ Lung or Breathing test  ____ Other: Please continue on current medical treatment.  Thank you.  Nona Dell, MD, F.A.C.C

## 2010-10-29 LAB — PROTIME-INR: Prothrombin Time: 13.3 seconds (ref 11.6–15.2)

## 2010-10-30 LAB — COMPREHENSIVE METABOLIC PANEL
ALT: 24 U/L (ref 0–35)
Albumin: 1.9 g/dL — ABNORMAL LOW (ref 3.5–5.2)
Alkaline Phosphatase: 61 U/L (ref 39–117)
BUN: 32 mg/dL — ABNORMAL HIGH (ref 6–23)
Chloride: 114 mEq/L — ABNORMAL HIGH (ref 96–112)
Glucose, Bld: 104 mg/dL — ABNORMAL HIGH (ref 70–99)
Potassium: 4.6 mEq/L (ref 3.5–5.1)
Sodium: 142 mEq/L (ref 135–145)
Total Bilirubin: 0.5 mg/dL (ref 0.3–1.2)
Total Protein: 4.9 g/dL — ABNORMAL LOW (ref 6.0–8.3)

## 2010-10-30 LAB — CBC
HCT: 30.8 % — ABNORMAL LOW (ref 36.0–46.0)
HCT: 31.6 % — ABNORMAL LOW (ref 36.0–46.0)
HCT: 33 % — ABNORMAL LOW (ref 36.0–46.0)
HCT: 34.6 % — ABNORMAL LOW (ref 36.0–46.0)
Hemoglobin: 10.6 g/dL — ABNORMAL LOW (ref 12.0–15.0)
Hemoglobin: 13.4 g/dL (ref 12.0–15.0)
MCH: 29.2 pg (ref 26.0–34.0)
MCH: 29.3 pg (ref 26.0–34.0)
MCH: 29.3 pg (ref 26.0–34.0)
MCH: 29.8 pg (ref 26.0–34.0)
MCHC: 33.2 g/dL (ref 30.0–36.0)
MCHC: 33.6 g/dL (ref 30.0–36.0)
MCHC: 33.8 g/dL (ref 30.0–36.0)
MCV: 87 fL (ref 78.0–100.0)
MCV: 87.6 fL (ref 78.0–100.0)
MCV: 88 fL (ref 78.0–100.0)
MCV: 88.1 fL (ref 78.0–100.0)
Platelets: 132 10*3/uL — ABNORMAL LOW (ref 150–400)
Platelets: 194 10*3/uL (ref 150–400)
Platelets: 252 10*3/uL (ref 150–400)
Platelets: 275 10*3/uL (ref 150–400)
Platelets: 287 10*3/uL (ref 150–400)
RBC: 3.61 MIL/uL — ABNORMAL LOW (ref 3.87–5.11)
RBC: 3.79 MIL/uL — ABNORMAL LOW (ref 3.87–5.11)
RBC: 3.95 MIL/uL (ref 3.87–5.11)
RBC: 4.58 MIL/uL (ref 3.87–5.11)
RDW: 16.7 % — ABNORMAL HIGH (ref 11.5–15.5)
RDW: 16.7 % — ABNORMAL HIGH (ref 11.5–15.5)
RDW: 16.7 % — ABNORMAL HIGH (ref 11.5–15.5)
RDW: 16.8 % — ABNORMAL HIGH (ref 11.5–15.5)
RDW: 17.1 % — ABNORMAL HIGH (ref 11.5–15.5)
WBC: 10 10*3/uL (ref 4.0–10.5)
WBC: 6 10*3/uL (ref 4.0–10.5)
WBC: 7.2 10*3/uL (ref 4.0–10.5)
WBC: 8.1 10*3/uL (ref 4.0–10.5)
WBC: 8.1 10*3/uL (ref 4.0–10.5)

## 2010-10-30 LAB — BASIC METABOLIC PANEL
BUN: 12 mg/dL (ref 6–23)
BUN: 12 mg/dL (ref 6–23)
BUN: 17 mg/dL (ref 6–23)
CO2: 22 mEq/L (ref 19–32)
CO2: 23 mEq/L (ref 19–32)
Calcium: 9.4 mg/dL (ref 8.4–10.5)
Calcium: 9.4 mg/dL (ref 8.4–10.5)
Chloride: 110 mEq/L (ref 96–112)
Chloride: 112 mEq/L (ref 96–112)
Creatinine, Ser: 0.86 mg/dL (ref 0.4–1.2)
Creatinine, Ser: 1.7 mg/dL — ABNORMAL HIGH (ref 0.4–1.2)
GFR calc Af Amer: 35 mL/min — ABNORMAL LOW (ref >60)
GFR calc Af Amer: 49 mL/min — ABNORMAL LOW (ref >60)
GFR calc Af Amer: >60 mL/min (ref >60)
GFR calc non Af Amer: 40 mL/min — ABNORMAL LOW (ref >60)
GFR calc non Af Amer: 51 mL/min — ABNORMAL LOW (ref >60)
GFR calc non Af Amer: 54 mL/min — ABNORMAL LOW (ref >60)
Glucose, Bld: 102 mg/dL — ABNORMAL HIGH (ref 70–99)
Glucose, Bld: 118 mg/dL — ABNORMAL HIGH (ref 70–99)
Potassium: 4.2 mEq/L (ref 3.5–5.1)
Potassium: 4.6 mEq/L (ref 3.5–5.1)
Potassium: 5.1 mEq/L (ref 3.5–5.1)
Sodium: 139 mEq/L (ref 135–145)
Sodium: 139 mEq/L (ref 135–145)
Sodium: 141 mEq/L (ref 135–145)

## 2010-10-30 LAB — URINE CULTURE: Colony Count: 75000

## 2010-10-30 LAB — PROTIME-INR
INR: 3.16 — ABNORMAL HIGH (ref 0.00–1.49)
Prothrombin Time: 19.4 seconds — ABNORMAL HIGH (ref 11.6–15.2)
Prothrombin Time: 19.5 seconds — ABNORMAL HIGH (ref 11.6–15.2)
Prothrombin Time: 25.6 seconds — ABNORMAL HIGH (ref 11.6–15.2)
Prothrombin Time: 32.9 seconds — ABNORMAL HIGH (ref 11.6–15.2)

## 2010-10-30 LAB — URINALYSIS, ROUTINE W REFLEX MICROSCOPIC
Glucose, UA: NEGATIVE mg/dL
Ketones, ur: NEGATIVE mg/dL
Protein, ur: 30 mg/dL — AB
Urobilinogen, UA: 0.2 mg/dL (ref 0.0–1.0)

## 2010-10-30 LAB — POCT CARDIAC MARKERS
CKMB, poc: 1.5 ng/mL (ref 1.0–8.0)
Myoglobin, poc: 50 ng/mL (ref 12–200)
Troponin i, poc: <0.05 ng/mL (ref 0.00–0.09)

## 2010-10-30 LAB — DIFFERENTIAL
Basophils Absolute: 0 10*3/uL (ref 0.0–0.1)
Basophils Absolute: 0 10*3/uL (ref 0.0–0.1)
Basophils Absolute: 0 10*3/uL (ref 0.0–0.1)
Basophils Absolute: 0.1 10*3/uL (ref 0.0–0.1)
Basophils Relative: 0 % (ref 0–1)
Basophils Relative: 0 % (ref 0–1)
Basophils Relative: 0 % (ref 0–1)
Basophils Relative: 0 % (ref 0–1)
Eosinophils Absolute: 0 10*3/uL (ref 0.0–0.7)
Eosinophils Absolute: 0.2 10*3/uL (ref 0.0–0.7)
Eosinophils Absolute: 0.2 10*3/uL (ref 0.0–0.7)
Eosinophils Absolute: 0.2 10*3/uL (ref 0.0–0.7)
Eosinophils Absolute: 0.2 10*3/uL (ref 0.0–0.7)
Eosinophils Relative: 0 % (ref 0–5)
Eosinophils Relative: 2 % (ref 0–5)
Eosinophils Relative: 2 % (ref 0–5)
Eosinophils Relative: 3 % (ref 0–5)
Lymphocytes Relative: 15 % (ref 12–46)
Lymphocytes Relative: 16 % (ref 12–46)
Lymphocytes Relative: 17 % (ref 12–46)
Lymphs Abs: 0.9 10*3/uL (ref 0.7–4.0)
Lymphs Abs: 1 10*3/uL (ref 0.7–4.0)
Lymphs Abs: 1 10*3/uL (ref 0.7–4.0)
Lymphs Abs: 1.5 10*3/uL (ref 0.7–4.0)
Monocytes Absolute: 0.7 10*3/uL (ref 0.1–1.0)
Monocytes Absolute: 0.7 10*3/uL (ref 0.1–1.0)
Monocytes Absolute: 0.8 10*3/uL (ref 0.1–1.0)
Monocytes Relative: 11 % (ref 3–12)
Monocytes Relative: 13 % — ABNORMAL HIGH (ref 3–12)
Monocytes Relative: 8 % (ref 3–12)
Neutro Abs: 4 10*3/uL (ref 1.7–7.7)
Neutro Abs: 5.1 10*3/uL (ref 1.7–7.7)
Neutro Abs: 5.8 10*3/uL (ref 1.7–7.7)
Neutrophils Relative %: 71 % (ref 43–77)

## 2010-10-30 LAB — CK TOTAL AND CKMB (NOT AT ARMC)
CK, MB: 1.8 ng/mL (ref 0.3–4.0)
CK, MB: 2.6 ng/mL (ref 0.3–4.0)
Relative Index: 0.8 (ref 0.0–2.5)
Relative Index: 0.8 (ref 0.0–2.5)
Relative Index: INVALID (ref 0.0–2.5)
Total CK: 216 U/L — ABNORMAL HIGH (ref 7–177)
Total CK: 93 U/L (ref 7–177)

## 2010-10-30 LAB — MRSA PCR SCREENING: MRSA by PCR: NEGATIVE

## 2010-10-30 LAB — URINE MICROSCOPIC-ADD ON

## 2010-10-30 LAB — HEMOCCULT GUIAC POC 1CARD (OFFICE): Fecal Occult Bld: NEGATIVE

## 2010-10-30 LAB — GLUCOSE, CAPILLARY: Glucose-Capillary: 116 mg/dL — ABNORMAL HIGH (ref 70–99)

## 2010-11-02 LAB — OVA AND PARASITE EXAMINATION: Ova and parasites: NONE SEEN

## 2010-11-02 LAB — CBC
HCT: 32.8 % — ABNORMAL LOW (ref 36.0–46.0)
HCT: 34.9 % — ABNORMAL LOW (ref 36.0–46.0)
HCT: 37.1 % (ref 36.0–46.0)
Hemoglobin: 11 g/dL — ABNORMAL LOW (ref 12.0–15.0)
Hemoglobin: 11.1 g/dL — ABNORMAL LOW (ref 12.0–15.0)
Hemoglobin: 11.9 g/dL — ABNORMAL LOW (ref 12.0–15.0)
Hemoglobin: 12.5 g/dL (ref 12.0–15.0)
MCHC: 33.1 g/dL (ref 30.0–36.0)
MCHC: 33.7 g/dL (ref 30.0–36.0)
MCHC: 34 g/dL (ref 30.0–36.0)
MCHC: 34.1 g/dL (ref 30.0–36.0)
MCV: 90.6 fL (ref 78.0–100.0)
MCV: 91.6 fL (ref 78.0–100.0)
Platelets: 115 K/uL — ABNORMAL LOW (ref 150–400)
Platelets: 119 10*3/uL — ABNORMAL LOW (ref 150–400)
Platelets: 144 10*3/uL — ABNORMAL LOW (ref 150–400)
Platelets: 207 10*3/uL (ref 150–400)
RBC: 3.66 MIL/uL — ABNORMAL LOW (ref 3.87–5.11)
RBC: 3.74 MIL/uL — ABNORMAL LOW (ref 3.87–5.11)
RBC: 3.89 MIL/uL (ref 3.87–5.11)
RBC: 4.1 MIL/uL (ref 3.87–5.11)
RBC: 4.65 MIL/uL (ref 3.87–5.11)
RDW: 13.7 % (ref 11.5–15.5)
RDW: 14 % (ref 11.5–15.5)
RDW: 14.3 % (ref 11.5–15.5)
RDW: 14.6 % (ref 11.5–15.5)
WBC: 5.1 10*3/uL (ref 4.0–10.5)
WBC: 5.4 K/uL (ref 4.0–10.5)
WBC: 6.1 10*3/uL (ref 4.0–10.5)
WBC: 8.6 10*3/uL (ref 4.0–10.5)

## 2010-11-02 LAB — DIFFERENTIAL
Basophils Absolute: 0 10*3/uL (ref 0.0–0.1)
Basophils Absolute: 0 K/uL (ref 0.0–0.1)
Basophils Absolute: 0 K/uL (ref 0.0–0.1)
Basophils Absolute: 0 K/uL (ref 0.0–0.1)
Basophils Absolute: 0.1 K/uL (ref 0.0–0.1)
Basophils Relative: 0 % (ref 0–1)
Basophils Relative: 0 % (ref 0–1)
Basophils Relative: 0 % (ref 0–1)
Basophils Relative: 1 % (ref 0–1)
Eosinophils Absolute: 0 10*3/uL (ref 0.0–0.7)
Eosinophils Absolute: 0 10*3/uL (ref 0.0–0.7)
Eosinophils Absolute: 0 K/uL (ref 0.0–0.7)
Eosinophils Absolute: 0 K/uL (ref 0.0–0.7)
Eosinophils Absolute: 0 K/uL (ref 0.0–0.7)
Eosinophils Absolute: 0.1 K/uL (ref 0.0–0.7)
Eosinophils Relative: 0 % (ref 0–5)
Eosinophils Relative: 0 % (ref 0–5)
Eosinophils Relative: 0 % (ref 0–5)
Eosinophils Relative: 0 % (ref 0–5)
Eosinophils Relative: 1 % (ref 0–5)
Eosinophils Relative: 2 % (ref 0–5)
Lymphocytes Relative: 12 % (ref 12–46)
Lymphocytes Relative: 13 % (ref 12–46)
Lymphocytes Relative: 20 % (ref 12–46)
Lymphocytes Relative: 26 % (ref 12–46)
Lymphocytes Relative: 4 % — ABNORMAL LOW (ref 12–46)
Lymphocytes Relative: 9 % — ABNORMAL LOW (ref 12–46)
Lymphs Abs: 0.2 K/uL — ABNORMAL LOW (ref 0.7–4.0)
Lymphs Abs: 0.5 10*3/uL — ABNORMAL LOW (ref 0.7–4.0)
Lymphs Abs: 0.8 K/uL (ref 0.7–4.0)
Lymphs Abs: 0.8 K/uL (ref 0.7–4.0)
Lymphs Abs: 1 10*3/uL (ref 0.7–4.0)
Lymphs Abs: 1.5 K/uL (ref 0.7–4.0)
Monocytes Absolute: 0.4 K/uL (ref 0.1–1.0)
Monocytes Absolute: 0.5 K/uL (ref 0.1–1.0)
Monocytes Absolute: 0.5 K/uL (ref 0.1–1.0)
Monocytes Absolute: 0.5 K/uL (ref 0.1–1.0)
Monocytes Relative: 10 % (ref 3–12)
Monocytes Relative: 3 % (ref 3–12)
Monocytes Relative: 5 % (ref 3–12)
Monocytes Relative: 6 % (ref 3–12)
Monocytes Relative: 8 % (ref 3–12)
Monocytes Relative: 9 % (ref 3–12)
Neutro Abs: 3.5 K/uL (ref 1.7–7.7)
Neutro Abs: 3.9 10*3/uL (ref 1.7–7.7)
Neutro Abs: 4.7 K/uL (ref 1.7–7.7)
Neutro Abs: 4.9 K/uL (ref 1.7–7.7)
Neutro Abs: 5.3 K/uL (ref 1.7–7.7)
Neutrophils Relative %: 62 % (ref 43–77)
Neutrophils Relative %: 76 % (ref 43–77)
Neutrophils Relative %: 79 % — ABNORMAL HIGH (ref 43–77)
Neutrophils Relative %: 82 % — ABNORMAL HIGH (ref 43–77)
Neutrophils Relative %: 86 % — ABNORMAL HIGH (ref 43–77)

## 2010-11-02 LAB — STOOL CULTURE

## 2010-11-02 LAB — URINE MICROSCOPIC-ADD ON

## 2010-11-02 LAB — BASIC METABOLIC PANEL WITH GFR
BUN: 16 mg/dL (ref 6–23)
BUN: 6 mg/dL (ref 6–23)
CO2: 24 meq/L (ref 19–32)
CO2: 25 meq/L (ref 19–32)
Calcium: 9.1 mg/dL (ref 8.4–10.5)
Calcium: 9.8 mg/dL (ref 8.4–10.5)
Chloride: 104 meq/L (ref 96–112)
Chloride: 116 meq/L — ABNORMAL HIGH (ref 96–112)
Creatinine, Ser: 0.82 mg/dL (ref 0.4–1.2)
Creatinine, Ser: 1.25 mg/dL — ABNORMAL HIGH (ref 0.4–1.2)
GFR calc non Af Amer: 41 mL/min — ABNORMAL LOW
GFR calc non Af Amer: >60 mL/min
Glucose, Bld: 103 mg/dL — ABNORMAL HIGH (ref 70–99)
Glucose, Bld: 94 mg/dL (ref 70–99)
Potassium: 3.5 meq/L (ref 3.5–5.1)
Potassium: 4.1 meq/L (ref 3.5–5.1)
Sodium: 135 meq/L (ref 135–145)
Sodium: 144 meq/L (ref 135–145)

## 2010-11-02 LAB — COMPREHENSIVE METABOLIC PANEL
ALT: 14 U/L (ref 0–35)
ALT: 14 U/L (ref 0–35)
AST: 25 U/L (ref 0–37)
CO2: 20 mEq/L (ref 19–32)
Calcium: 8.5 mg/dL (ref 8.4–10.5)
Chloride: 118 mEq/L — ABNORMAL HIGH (ref 96–112)
GFR calc Af Amer: 57 mL/min — ABNORMAL LOW (ref >60)
GFR calc Af Amer: >60 mL/min (ref >60)
GFR calc non Af Amer: 47 mL/min — ABNORMAL LOW (ref >60)
Glucose, Bld: 83 mg/dL (ref 70–99)
Sodium: 142 mEq/L (ref 135–145)
Sodium: 145 mEq/L (ref 135–145)
Total Bilirubin: 0.5 mg/dL (ref 0.3–1.2)
Total Protein: 3.9 g/dL — ABNORMAL LOW (ref 6.0–8.3)

## 2010-11-02 LAB — URINALYSIS, ROUTINE W REFLEX MICROSCOPIC
Glucose, UA: NEGATIVE mg/dL
Glucose, UA: NEGATIVE mg/dL
Ketones, ur: NEGATIVE mg/dL
Protein, ur: 100 mg/dL — AB
pH: 5.5 (ref 5.0–8.0)
pH: 5.5 (ref 5.0–8.0)

## 2010-11-02 LAB — MAGNESIUM: Magnesium: 1.3 mg/dL — ABNORMAL LOW (ref 1.5–2.5)

## 2010-11-02 LAB — BASIC METABOLIC PANEL
BUN: 21 mg/dL (ref 6–23)
BUN: 26 mg/dL — ABNORMAL HIGH (ref 6–23)
CO2: 25 mEq/L (ref 19–32)
Calcium: 8.6 mg/dL (ref 8.4–10.5)
Chloride: 107 mEq/L (ref 96–112)
Chloride: 108 mEq/L (ref 96–112)
Creatinine, Ser: 2.03 mg/dL — ABNORMAL HIGH (ref 0.4–1.2)
GFR calc Af Amer: 28 mL/min — ABNORMAL LOW (ref >60)
GFR calc Af Amer: 29 mL/min — ABNORMAL LOW (ref >60)
GFR calc Af Amer: 44 mL/min — ABNORMAL LOW (ref >60)
GFR calc non Af Amer: 24 mL/min — ABNORMAL LOW (ref >60)
GFR calc non Af Amer: 36 mL/min — ABNORMAL LOW (ref >60)
Potassium: 3.1 mEq/L — ABNORMAL LOW (ref 3.5–5.1)
Potassium: 3.8 mEq/L (ref 3.5–5.1)
Sodium: 139 mEq/L (ref 135–145)

## 2010-11-02 LAB — TYPE AND SCREEN

## 2010-11-02 LAB — PROTIME-INR: Prothrombin Time: 13 seconds (ref 11.6–15.2)

## 2010-11-02 LAB — HEPATIC FUNCTION PANEL
ALT: 15 U/L (ref 0–35)
AST: 30 U/L (ref 0–37)
Albumin: 2 g/dL — ABNORMAL LOW (ref 3.5–5.2)
Alkaline Phosphatase: 46 U/L (ref 39–117)
Bilirubin, Direct: 0.1 mg/dL (ref 0.0–0.3)
Indirect Bilirubin: 0.3 mg/dL (ref 0.3–0.9)
Total Bilirubin: 0.4 mg/dL (ref 0.3–1.2)
Total Protein: 4.1 g/dL — ABNORMAL LOW (ref 6.0–8.3)

## 2010-11-02 LAB — TSH: TSH: 2.214 u[IU]/mL (ref 0.350–4.500)

## 2010-11-02 LAB — BRAIN NATRIURETIC PEPTIDE
Pro B Natriuretic peptide (BNP): 244 pg/mL — ABNORMAL HIGH (ref 0.0–100.0)
Pro B Natriuretic peptide (BNP): 52.7 pg/mL (ref 0.0–100.0)
Pro B Natriuretic peptide (BNP): 88.2 pg/mL (ref 0.0–100.0)

## 2010-11-02 LAB — CULTURE, BLOOD (ROUTINE X 2): Culture: NO GROWTH

## 2010-11-02 LAB — CLOSTRIDIUM DIFFICILE EIA

## 2010-11-02 LAB — URINE CULTURE: Colony Count: >=100000

## 2010-11-02 LAB — T4, FREE
Free T4: 1.06 ng/dL (ref 0.80–1.80)
Free T4: 1.11 ng/dL (ref 0.80–1.80)

## 2010-11-02 LAB — CORTISOL: Cortisol, Plasma: 37.3 ug/dL

## 2010-11-02 LAB — SEDIMENTATION RATE: Sed Rate: 20 mm/hr (ref 0–22)

## 2010-11-02 LAB — LACTIC ACID, PLASMA: Lactic Acid, Venous: 2.6 mmol/L — ABNORMAL HIGH (ref 0.5–2.2)

## 2010-11-02 LAB — HEMOCCULT GUIAC POC 1CARD (OFFICE): Fecal Occult Bld: NEGATIVE

## 2010-11-02 LAB — LIPID PANEL
Cholesterol: 80 mg/dL (ref 0–200)
LDL Cholesterol: 33 mg/dL (ref 0–99)

## 2010-11-02 LAB — C-REACTIVE PROTEIN: CRP: 19 mg/dL — ABNORMAL HIGH (ref <0.6)

## 2010-11-02 LAB — MRSA PCR SCREENING: MRSA by PCR: NEGATIVE

## 2010-11-02 LAB — APTT: aPTT: 29 s (ref 24–37)

## 2010-11-06 LAB — BLOOD GAS, ARTERIAL
Acid-Base Excess: 0.8 mmol/L (ref 0.0–2.0)
Bicarbonate: 24.4 meq/L — ABNORMAL HIGH (ref 20.0–24.0)
FIO2: 21 %
O2 Saturation: 96.9 %
Patient temperature: 37
TCO2: 21.3 mmol/L (ref 0–100)
pCO2 arterial: 36 mmHg (ref 35.0–45.0)
pH, Arterial: 7.447 — ABNORMAL HIGH (ref 7.350–7.400)
pO2, Arterial: 84.7 mmHg (ref 80.0–100.0)

## 2010-12-30 NOTE — Op Note (Signed)
[facility]  Patient:    ANAYELLI, LAI Visit Number: [id number] MRN: [id number]          Service Type: END Location: DAY Attending Physician:  Jonathon Bellows Dictated by:   Roetta Sessions, M.D. Proc. Date: 11/28/01 Admit Date:  11/28/2001   CC:         Butch Penny, M.D.   Operative Report  PROCEDURE:  Esophagogastroduodenoscopy with snare polypectomy followed by diagnostic colonoscopy.  INDICATIONS FOR PROCEDURE:  The patients a 77 year old lady who recently saw Dr. Renard Matter for a checkup. She was progressively fatigued and had not seen any doctor in nine years per her report. She was found to have a hemoglobin of 8 with a MCV of 68. She was Hemoccult positive. She was otherwise essentially devoid of any GI symptoms. No melena, no rectal bleeding and no abdominal pain. No weight loss and no upper GI tract symptoms which is odynophagia, dysphagia, early satiety or reflux symptoms, nausea or vomiting. She has never had her lower GI tract imaged and she is not taking nonsteroidals. EGD and colonoscopy are now being done to further evaluate her anemia and Hemoccult positive status. This approach has been discussed with Ms. Brien at the bedside at length. Potential risks, benefits, and alternatives have been discussed, questions answered, and she is agreeable. Please see my handwritten H&P for more information.  DESCRIPTION OF PROCEDURE:  O2 saturation, blood pressure, pulse and respirations were monitored throughout the entirety of both procedures. Conscious sedation for both procedure was Versed 3 mg IV and Demerol 75 mg IV in divided doses. Cetacaine spray for topical and pharyngeal anesthesia. Instruments Olympus video gastroscope and colonoscope.  FINDINGS:  EGD examination of the tubular esophagus revealed no abnormalities. The EG junction was easily traversed.  STOMACH:  The gastric cavity was emptied and insufflated well with  air. Thorough examination of the gastric mucosa including a retroflexed view of the proximal stomach, esophagogastric junction was undertaken. The patient had a small hiatal hernia and a 1 cm ulcerated pedunculated polyp on the stalk involving the proximal gastric mucosa within the hernia sac. In fact, the area of the polyp was straddling the diaphragmatic hiatus and with respirations it was flopping back and forth. There was also an adjacent 1/2 cm polyp on the stalk as well. The remainder of the gastric mucosa appeared entirely normal. The pylorus was patent and easily traversed.  DUODENUM:  The bulb and second portion appeared normal.  THERAPEUTIC/DIAGNOSTIC MANEUVERS PERFORMED:  Both polyps were resected with snare cautery and recovered with the cherry picker or through suctioning. Polypectomy was performed without apparent complications. The patient tolerated the procedure well and was prepared for colonoscopy.  A digital rectal examination revealed no abnormalities.  ENDOSCOPIC FINDINGS:  The prep was good.  RECTUM:  Examination of rectal mucosa including retroflexed view of the anal verge revealed no abnormalities.  COLON:  Colonic mucosa was surveyed from the rectosigmoid junction through the left transverse and right colon to the area of the appendiceal orifice, ileocecal valve, and cecum. These structures were well seen and photographed for the record. The patient had left side and transverse diverticula; however, the remainder of the colonic mucosa appeared normal from the level of the cecum to ileocecal valve. The scope was slowly withdrawn. All previously mentioned mucosal surfaces were again seen and no other abnormalities were observed. The patient tolerated the procedure well and was reactive to endoscopy.  IMPRESSION:  1. EGD--2 polyps in the proximal stomach, one  larger polyp likely being     subjected to the trauma of being pulled back and forth across the      diaphragmatic hiatus over time. It was bleeding. This is likely the cause     of the patients Hemoccult positive status and microcytic anemia. Two     polyps in the proximal stomach were resected.  2. Moderate size hiatal hernia and remainder of upper GI tract appeared     normal.  COLONOSCOPY FINDINGS:  1. Normal rectum.  2. Left sided transverse diverticula. The remainder of the colonic mucosa     appeared normal.  The good new today is the patient does not have colon cancer. I suspect the polyp in the stomach has bled over a long period of time producing microcytic anemia and Hemoccult positive stools.  RECOMMENDATIONS:  1. No aspirin or ______ medications for 10 days.  2. Prevacid 30 mg daily for the next month. Will check a CBC today to     see where in terms of H&H.  3. Dry H. pylori serologies.  4. Follow-up on path.  5. Further recommendations to follow. Dictated by:   Roetta Sessions, M.D. Attending Physician:  Jonathon Bellows DD:  11/28/01 TD:  11/29/01 Job: [id number]

## 2011-04-06 ENCOUNTER — Other Ambulatory Visit (HOSPITAL_COMMUNITY): Payer: Self-pay | Admitting: Internal Medicine

## 2011-04-06 DIAGNOSIS — Z139 Encounter for screening, unspecified: Secondary | ICD-10-CM

## 2011-04-10 ENCOUNTER — Ambulatory Visit (HOSPITAL_COMMUNITY)
Admission: RE | Admit: 2011-04-10 | Discharge: 2011-04-10 | Disposition: A | Discharge status: Home / Self Care | Payer: Medicare HMO | Source: Ambulatory Visit | Attending: Internal Medicine | Admitting: Internal Medicine

## 2011-04-10 DIAGNOSIS — Z1231 Encounter for screening mammogram for malignant neoplasm of breast: Secondary | ICD-10-CM | POA: Insufficient documentation

## 2011-04-10 DIAGNOSIS — Z139 Encounter for screening, unspecified: Secondary | ICD-10-CM

## 2011-05-22 ENCOUNTER — Inpatient Hospital Stay (HOSPITAL_COMMUNITY)
Admission: EM | Admit: 2011-05-22 | Discharge: 2011-05-26 | DRG: 481 | Disposition: A | Discharge status: Skilled Nursing Facility | Payer: Medicare HMO | Attending: Orthopedic Surgery | Admitting: Orthopedic Surgery

## 2011-05-22 ENCOUNTER — Encounter: Payer: Self-pay | Admitting: Emergency Medicine

## 2011-05-22 ENCOUNTER — Emergency Department (HOSPITAL_COMMUNITY): Payer: Medicare HMO

## 2011-05-22 DIAGNOSIS — I509 Heart failure, unspecified: Secondary | ICD-10-CM | POA: Diagnosis present

## 2011-05-22 DIAGNOSIS — S72002A Fracture of unspecified part of neck of left femur, initial encounter for closed fracture: Secondary | ICD-10-CM

## 2011-05-22 DIAGNOSIS — N390 Urinary tract infection, site not specified: Secondary | ICD-10-CM | POA: Diagnosis present

## 2011-05-22 DIAGNOSIS — I10 Essential (primary) hypertension: Secondary | ICD-10-CM | POA: Diagnosis present

## 2011-05-22 DIAGNOSIS — S72009A Fracture of unspecified part of neck of unspecified femur, initial encounter for closed fracture: Secondary | ICD-10-CM | POA: Diagnosis present

## 2011-05-22 DIAGNOSIS — Z86718 Personal history of other venous thrombosis and embolism: Secondary | ICD-10-CM

## 2011-05-22 DIAGNOSIS — W010XXA Fall on same level from slipping, tripping and stumbling without subsequent striking against object, initial encounter: Secondary | ICD-10-CM | POA: Diagnosis present

## 2011-05-22 DIAGNOSIS — B961 Klebsiella pneumoniae [K. pneumoniae] as the cause of diseases classified elsewhere: Secondary | ICD-10-CM | POA: Diagnosis present

## 2011-05-22 DIAGNOSIS — S72033A Displaced midcervical fracture of unspecified femur, initial encounter for closed fracture: Principal | ICD-10-CM | POA: Diagnosis present

## 2011-05-22 HISTORY — DX: Pneumonia, unspecified organism: J18.9

## 2011-05-22 HISTORY — DX: Essential (primary) hypertension: I10

## 2011-05-22 HISTORY — DX: Heart failure, unspecified: I50.9

## 2011-05-22 HISTORY — DX: Unspecified osteoarthritis, unspecified site: M19.90

## 2011-05-22 HISTORY — DX: Calculus of kidney: N20.0

## 2011-05-22 LAB — BASIC METABOLIC PANEL
CO2: 27 mEq/L (ref 19–32)
Calcium: 10.4 mg/dL (ref 8.4–10.5)
Chloride: 107 mEq/L (ref 96–112)
Glucose, Bld: 107 mg/dL — ABNORMAL HIGH (ref 70–99)
Sodium: 142 mEq/L (ref 135–145)

## 2011-05-22 LAB — URINALYSIS, ROUTINE W REFLEX MICROSCOPIC
Bilirubin Urine: NEGATIVE
Nitrite: POSITIVE — AB
Specific Gravity, Urine: 1.01 (ref 1.005–1.030)
Urobilinogen, UA: 0.2 mg/dL (ref 0.0–1.0)
pH: 7 (ref 5.0–8.0)

## 2011-05-22 LAB — CBC
HCT: 42 % (ref 36.0–46.0)
MCH: 29.1 pg (ref 26.0–34.0)
MCV: 91.1 fL (ref 78.0–100.0)
Platelets: 182 10*3/uL (ref 150–400)
RBC: 4.61 MIL/uL (ref 3.87–5.11)
WBC: 16.6 10*3/uL — ABNORMAL HIGH (ref 4.0–10.5)

## 2011-05-22 LAB — URINE MICROSCOPIC-ADD ON

## 2011-05-22 MED ORDER — SODIUM CHLORIDE 0.9 % IV SOLN
INTRAVENOUS | Status: DC
  Administered 2011-05-22 – 2011-05-23 (×2): via INTRAVENOUS

## 2011-05-22 MED ORDER — AMLODIPINE BESYLATE 5 MG PO TABS
2.5000 mg | ORAL_TABLET | Freq: Every day | ORAL | Status: DC
  Administered 2011-05-22 – 2011-05-25 (×4): 2.5 mg via ORAL
  Filled 2011-05-22 (×4): qty 1

## 2011-05-22 MED ORDER — FUROSEMIDE 40 MG PO TABS
40.0000 mg | ORAL_TABLET | Freq: Every day | ORAL | Status: DC
  Administered 2011-05-22 – 2011-05-26 (×5): 40 mg via ORAL
  Filled 2011-05-22 (×5): qty 1

## 2011-05-22 MED ORDER — MORPHINE SULFATE 4 MG/ML IJ SOLN
4.0000 mg | INTRAMUSCULAR | Status: DC | PRN
  Administered 2011-05-22 – 2011-05-23 (×2): 4 mg via INTRAVENOUS
  Filled 2011-05-22 (×2): qty 1

## 2011-05-22 MED ORDER — POTASSIUM CHLORIDE CRYS ER 10 MEQ PO TBCR
10.0000 meq | EXTENDED_RELEASE_TABLET | Freq: Every day | ORAL | Status: DC
  Administered 2011-05-22 – 2011-05-26 (×5): 10 meq via ORAL
  Filled 2011-05-22 (×5): qty 1

## 2011-05-22 MED ORDER — MORPHINE SULFATE 4 MG/ML IJ SOLN
4.0000 mg | Freq: Once | INTRAMUSCULAR | Status: AC
  Administered 2011-05-22: 4 mg via INTRAVENOUS
  Filled 2011-05-22: qty 1

## 2011-05-22 NOTE — ED Provider Notes (Signed)
History   Chart scribed for Doug Sou, MD by Enos Fling; the patient was seen in room APA04/APA04; this patient's care was started at 11:00 AM.    CSN: [id number] Arrival date & time: 05/22/2011  9:42 AM  Chief Complaint  Patient presents with  . Hip Pain  . Fall    HPI Holly Sloan is a 77 y.o. female who presents to the Emergency Department complaining of hip pain s/p fall. Pt states she fell backward this AM when she lost her balance while trying to turn on a heater. Pt c/o left hip pain extending to upper thigh, but denies any other injury. Pain is mild but worse with movement. No numbness, tingling, or weakness. No head injury, LOC, or headache. Pt in usual state of health prior to fall.   Past Medical History  Diagnosis Date  . CHF (congestive heart failure)   . Hypertension   . Arthritis   . Kidney stone     History reviewed. No pertinent past surgical history.  History reviewed. No pertinent family history.  History  Substance Use Topics  . Smoking status: Never Smoker   . Smokeless tobacco: Not on file  . Alcohol Use: No    OB History    Grav Para Term Preterm Abortions TAB SAB Ect Mult Living                  Review of Systems  Constitutional: Negative.   HENT: Negative.   Respiratory: Negative.   Cardiovascular: Negative.   Gastrointestinal: Negative.   Musculoskeletal: Positive for arthralgias. Negative for back pain.  Skin: Negative.   Neurological: Negative.   Hematological: Negative.   Psychiatric/Behavioral: Negative.     Allergies  Review of patient's allergies indicates no known allergies.  Home Medications   Current Outpatient Rx  Name Route Sig Dispense Refill  . ACETAMINOPHEN 500 MG PO TABS Oral Take 500 mg by mouth every 6 (six) hours as needed. pain     . AMLODIPINE BESYLATE 2.5 MG PO TABS Oral Take 2.5 mg by mouth at bedtime.      . FUROSEMIDE 40 MG PO TABS Oral Take 40 mg by mouth daily.      Marland Kitchen POTASSIUM CHLORIDE 10  MEQ PO TBCR Oral Take 10 mEq by mouth daily.        BP 152/80  Pulse 76  Temp(Src) 97.9 F (36.6 C) (Oral)  Resp 16  Ht 5\' 6"  (1.676 m)  Wt 200 lb (90.719 kg)  BMI 32.28 kg/m2  SpO2 96%  Physical Exam  Constitutional: She appears well-developed and well-nourished. No distress.  HENT:  Head: Normocephalic and atraumatic.  Mouth/Throat: Oropharynx is clear and moist.  Eyes: Conjunctivae are normal. Pupils are equal, round, and reactive to light.  Neck: Neck supple. No spinous process tenderness and no muscular tenderness present. No tracheal deviation present. No thyromegaly present.  Cardiovascular: Normal rate, regular rhythm and normal heart sounds.   No murmur heard. Pulmonary/Chest: Effort normal and breath sounds normal.  Abdominal: Soft. Bowel sounds are normal. She exhibits no distension. There is no tenderness.  Musculoskeletal: Normal range of motion. She exhibits tenderness. She exhibits no edema.       Pelvis stable; minimal left hip tenderness and mild pain with internal rotation  Neurological: She is alert. She has normal strength. Coordination normal.  Skin: Skin is warm and dry. No rash noted.  Psychiatric: She has a normal mood and affect.    ED Course  Procedures -  none  OTHER DATA REVIEWED: Nursing notes and vital signs reviewed. Prior records reviewed.   Labs Reviewed  BASIC METABOLIC PANEL  CBC  TYPE AND SCREEN  URINALYSIS, ROUTINE W REFLEX MICROSCOPIC   Dg Chest 1 View  05/22/2011  *RADIOLOGY REPORT*  Clinical Data: Fall, altered mental status, left femoral neck fracture  CHEST - 1 VIEW  Comparison: 10/15/2009  Findings: Chronic elevation right diaphragm. Enlargement of cardiac silhouette. Tortuous aorta. Slight pulmonary vascular cephalization. Chronic bronchitic changes. Chronic right basilar atelectasis. No definite acute infiltrate or effusion. Bones appear demineralized.  IMPRESSION: Enlargement of cardiac silhouette. Chronic bronchitic changes  and right basilar atelectasis.  Original Report Authenticated By: Lollie Marrow, M.D.   Dg Hip Complete Left  05/22/2011  *RADIOLOGY REPORT*  Clinical Data: Fall, altered mental status  LEFT HIP - COMPLETE 2+ VIEW  Comparison: None  Findings: Diffuse osseous demineralization. Minimally displaced subcapital fracture left femoral neck. No dislocation. No additional focal bony abnormalities identified.  IMPRESSION: Osseous demineralization. Minimally displaced subcapital fracture proximal left femur.  Per CMS PQRS reporting requirements (PQRS Measure 24): Given the patient's age of greater than 50 and the fracture site (hip, distal radius, or spine), the patient should be tested for osteoporosis using DXA, and the appropriate treatment considered based on the DXA results.  Original Report Authenticated By: Lollie Marrow, M.D.   11:15 AM  Discussed all results with pt and family; will consult ortho; pt declines pain meds at this time  11:45 AM  Case discussed with Dr. Romeo Apple who has seen pt previously, will for pt. Verbal orders given to nurse.  MDM   Admit for ORIF of left hip IMPRESSION: 1. Fracture of left hip     SCRIBE ATTESTATION: I personally performed the services described in this documentation, which was scribed in my presence. The recorded information has been reviewed and considered.       Doug Sou, MD 05/22/11 1158

## 2011-05-22 NOTE — ED Notes (Signed)
Larey Seat while trying to turn on heater, fell backwards, c/o left hip pain

## 2011-05-22 NOTE — ED Notes (Signed)
Pt at Xray at this time 

## 2011-05-22 NOTE — ED Notes (Signed)
Admission orders received from Dr. Romeo Apple

## 2011-05-22 NOTE — H&P (Signed)
  77 year old female fractured her LEFT hip has a femoral neck fracture.  The x-rays are of poor quality on the lateral view.  Therefore I cannot determine what kind of surgery the patient needs at this point.  I am not on call today.  The patient requested regional orthopaedics for her care.  I will see her in the morning.  I have reviewed her medical record.  All discussed treatment options which include screw fixation or pinning vs. Partial replacement.  If she would like to have the fracture pinned we will have to repeat the x-rays to get a better lateral view

## 2011-05-23 ENCOUNTER — Inpatient Hospital Stay (HOSPITAL_COMMUNITY): Payer: Medicare HMO

## 2011-05-23 ENCOUNTER — Encounter (HOSPITAL_COMMUNITY): Payer: Self-pay | Admitting: Anesthesiology

## 2011-05-23 ENCOUNTER — Other Ambulatory Visit: Payer: Self-pay

## 2011-05-23 ENCOUNTER — Encounter (HOSPITAL_COMMUNITY)
Admission: EM | Disposition: A | Discharge status: Skilled Nursing Facility | Payer: Self-pay | Source: Home / Self Care | Attending: Orthopedic Surgery

## 2011-05-23 ENCOUNTER — Inpatient Hospital Stay (HOSPITAL_COMMUNITY): Payer: Medicare HMO | Admitting: Anesthesiology

## 2011-05-23 ENCOUNTER — Encounter (HOSPITAL_COMMUNITY): Payer: Self-pay | Admitting: *Deleted

## 2011-05-23 DIAGNOSIS — S72009A Fracture of unspecified part of neck of unspecified femur, initial encounter for closed fracture: Secondary | ICD-10-CM

## 2011-05-23 HISTORY — PX: HIP PINNING,CANNULATED: SHX1758

## 2011-05-23 LAB — PREPARE RBC (CROSSMATCH)

## 2011-05-23 SURGERY — FIXATION, FEMUR, NECK, PERCUTANEOUS, USING SCREW
Anesthesia: Spinal | Site: Hip | Laterality: Left | Wound class: Clean

## 2011-05-23 MED ORDER — ALUM & MAG HYDROXIDE-SIMETH 200-200-20 MG/5ML PO SUSP: 30.0000 mL | ORAL | Status: DC | PRN

## 2011-05-23 MED ORDER — MAGNESIUM HYDROXIDE 400 MG/5ML PO SUSP: 30.0000 mL | Freq: Two times a day (BID) | ORAL | Status: DC | PRN

## 2011-05-23 MED ORDER — MEPERIDINE HCL 50 MG/ML IJ SOLN
INTRAMUSCULAR | Status: AC
  Administered 2011-05-23: 14:00:00
  Filled 2011-05-23: qty 1

## 2011-05-23 MED ORDER — MIDAZOLAM HCL 2 MG/2ML IJ SOLN
INTRAMUSCULAR | Status: AC
  Filled 2011-05-23: qty 2

## 2011-05-23 MED ORDER — FENTANYL CITRATE 0.05 MG/ML IJ SOLN
INTRAMUSCULAR | Status: DC | PRN
  Administered 2011-05-23: 25 ug via INTRATHECAL

## 2011-05-23 MED ORDER — EPHEDRINE SULFATE 50 MG/ML IJ SOLN
INTRAMUSCULAR | Status: DC | PRN
  Administered 2011-05-23: 5 mg via INTRAVENOUS
  Administered 2011-05-23: 10 mg via INTRAVENOUS
  Administered 2011-05-23: 5 mg via INTRAVENOUS

## 2011-05-23 MED ORDER — EPHEDRINE SULFATE 50 MG/ML IJ SOLN
INTRAMUSCULAR | Status: AC
  Filled 2011-05-23: qty 1

## 2011-05-23 MED ORDER — BUPIVACAINE-EPINEPHRINE PF 0.5-1:200000 % IJ SOLN
INTRAMUSCULAR | Status: DC | PRN
  Administered 2011-05-23: 60 mL

## 2011-05-23 MED ORDER — ACETAMINOPHEN 325 MG PO TABS: 650.0000 mg | ORAL_TABLET | Freq: Four times a day (QID) | ORAL | Status: DC | PRN

## 2011-05-23 MED ORDER — METOCLOPRAMIDE HCL 5 MG PO TABS
5.0000 mg | ORAL_TABLET | Freq: Three times a day (TID) | ORAL | Status: DC | PRN
  Filled 2011-05-23: qty 2

## 2011-05-23 MED ORDER — ONDANSETRON HCL 4 MG PO TABS: 4.0000 mg | ORAL_TABLET | Freq: Four times a day (QID) | ORAL | Status: DC | PRN

## 2011-05-23 MED ORDER — BUPIVACAINE IN DEXTROSE 0.75-8.25 % IT SOLN
INTRATHECAL | Status: AC
  Filled 2011-05-23: qty 2

## 2011-05-23 MED ORDER — BISACODYL 5 MG PO TBEC
10.0000 mg | DELAYED_RELEASE_TABLET | Freq: Every day | ORAL | Status: DC
  Administered 2011-05-23 – 2011-05-24 (×2): 10 mg via ORAL
  Filled 2011-05-23 (×3): qty 2

## 2011-05-23 MED ORDER — ONDANSETRON HCL 4 MG/2ML IJ SOLN: 4.0000 mg | Freq: Once | INTRAMUSCULAR | Status: DC | PRN

## 2011-05-23 MED ORDER — BUPIVACAINE HCL 0.75 % IJ SOLN
INTRAMUSCULAR | Status: DC | PRN
  Administered 2011-05-23: 12 mg via INTRATHECAL

## 2011-05-23 MED ORDER — MEPERIDINE HCL 25 MG/ML IJ SOLN
6.0000 mg | Freq: Once | INTRAMUSCULAR | Status: AC
  Administered 2011-05-23: 6 mg via INTRAVENOUS

## 2011-05-23 MED ORDER — PROPOFOL 10 MG/ML IV EMUL
INTRAVENOUS | Status: AC
  Filled 2011-05-23: qty 20

## 2011-05-23 MED ORDER — MIDAZOLAM HCL 2 MG/2ML IJ SOLN
1.0000 mg | INTRAMUSCULAR | Status: DC | PRN
  Administered 2011-05-23 (×2): 2 mg via INTRAVENOUS

## 2011-05-23 MED ORDER — FLEET ENEMA 7-19 GM/118ML RE ENEM: 1.0000 | ENEMA | Freq: Every day | RECTAL | Status: DC | PRN

## 2011-05-23 MED ORDER — BUPIVACAINE-EPINEPHRINE PF 0.5-1:200000 % IJ SOLN
INTRAMUSCULAR | Status: AC
  Filled 2011-05-23: qty 20

## 2011-05-23 MED ORDER — METOCLOPRAMIDE HCL 10 MG PO TABS: 5.0000 mg | ORAL_TABLET | Freq: Three times a day (TID) | ORAL | Status: DC | PRN

## 2011-05-23 MED ORDER — PHENOL 1.4 % MT LIQD
1.0000 | OROMUCOSAL | Status: DC | PRN
  Administered 2011-05-24 (×2): 1 via OROMUCOSAL
  Filled 2011-05-23: qty 177

## 2011-05-23 MED ORDER — ALUMINUM HYDROXIDE GEL 600 MG/5ML PO SUSP
15.0000 mL | ORAL | Status: DC | PRN
  Filled 2011-05-23: qty 30

## 2011-05-23 MED ORDER — FENTANYL CITRATE 0.05 MG/ML IJ SOLN: 25.0000 ug | INTRAMUSCULAR | Status: DC | PRN

## 2011-05-23 MED ORDER — CEFAZOLIN SODIUM-DEXTROSE 2-3 GM-% IV SOLR
2.0000 g | INTRAVENOUS | Status: AC
  Administered 2011-05-23: 2 g via INTRAVENOUS
  Filled 2011-05-23: qty 50

## 2011-05-23 MED ORDER — CEFAZOLIN SODIUM-DEXTROSE 2-3 GM-% IV SOLR
2.0000 g | Freq: Four times a day (QID) | INTRAVENOUS | Status: AC
  Administered 2011-05-23 – 2011-05-24 (×3): 2 g via INTRAVENOUS
  Filled 2011-05-23 (×3): qty 50

## 2011-05-23 MED ORDER — HYDROCODONE-ACETAMINOPHEN 5-325 MG PO TABS
1.0000 | ORAL_TABLET | ORAL | Status: DC | PRN
  Administered 2011-05-23: 2 via ORAL
  Administered 2011-05-23: 1 via ORAL
  Administered 2011-05-24: 2 via ORAL
  Administered 2011-05-25 – 2011-05-26 (×3): 1 via ORAL
  Filled 2011-05-23: qty 1
  Filled 2011-05-23 (×2): qty 2
  Filled 2011-05-23 (×2): qty 1
  Filled 2011-05-23: qty 2
  Filled 2011-05-23: qty 1

## 2011-05-23 MED ORDER — SODIUM CHLORIDE 0.9 % IR SOLN
Status: DC | PRN
  Administered 2011-05-23: 1000 mL

## 2011-05-23 MED ORDER — MENTHOL 3 MG MT LOZG
1.0000 | LOZENGE | OROMUCOSAL | Status: DC | PRN
  Filled 2011-05-23: qty 9

## 2011-05-23 MED ORDER — METOCLOPRAMIDE HCL 5 MG/ML IJ SOLN: 5.0000 mg | Freq: Three times a day (TID) | INTRAMUSCULAR | Status: DC | PRN

## 2011-05-23 MED ORDER — LACTATED RINGERS IV SOLN
INTRAVENOUS | Status: DC
  Administered 2011-05-23 (×2): via INTRAVENOUS

## 2011-05-23 MED ORDER — ONDANSETRON HCL 4 MG/2ML IJ SOLN
4.0000 mg | Freq: Four times a day (QID) | INTRAMUSCULAR | Status: DC | PRN
  Administered 2011-05-24: 4 mg via INTRAVENOUS
  Filled 2011-05-23: qty 2

## 2011-05-23 MED ORDER — METHOCARBAMOL 100 MG/ML IJ SOLN
500.0000 mg | Freq: Four times a day (QID) | INTRAVENOUS | Status: DC | PRN
  Filled 2011-05-23: qty 5

## 2011-05-23 MED ORDER — ACETAMINOPHEN 650 MG RE SUPP: 650.0000 mg | Freq: Four times a day (QID) | RECTAL | Status: DC | PRN

## 2011-05-23 MED ORDER — DOCUSATE SODIUM 100 MG PO CAPS
100.0000 mg | ORAL_CAPSULE | Freq: Two times a day (BID) | ORAL | Status: DC
  Administered 2011-05-23 – 2011-05-24 (×4): 100 mg via ORAL
  Filled 2011-05-23 (×4): qty 1

## 2011-05-23 MED ORDER — POTASSIUM CHLORIDE IN NACL 20-0.9 MEQ/L-% IV SOLN
INTRAVENOUS | Status: DC
  Administered 2011-05-23: 100 mL via INTRAVENOUS
  Administered 2011-05-24 – 2011-05-25 (×3): via INTRAVENOUS

## 2011-05-23 MED ORDER — METHOCARBAMOL 500 MG PO TABS: 500.0000 mg | ORAL_TABLET | Freq: Four times a day (QID) | ORAL | Status: DC | PRN

## 2011-05-23 MED ORDER — DIPHENHYDRAMINE HCL 12.5 MG/5ML PO ELIX: 12.5000 mg | ORAL_SOLUTION | ORAL | Status: DC | PRN

## 2011-05-23 MED ORDER — FENTANYL CITRATE 0.05 MG/ML IJ SOLN
INTRAMUSCULAR | Status: DC | PRN
  Administered 2011-05-23: 25 ug via INTRAVENOUS
  Administered 2011-05-23: 50 ug via INTRAVENOUS

## 2011-05-23 MED ORDER — MORPHINE SULFATE 2 MG/ML IJ SOLN
1.0000 mg | INTRAMUSCULAR | Status: DC | PRN
  Administered 2011-05-23 – 2011-05-24 (×3): 1 mg via INTRAVENOUS
  Filled 2011-05-23 (×4): qty 1

## 2011-05-23 MED ORDER — POLYETHYLENE GLYCOL 3350 17 G PO PACK: 17.0000 g | PACK | Freq: Every day | ORAL | Status: DC | PRN

## 2011-05-23 MED ORDER — PROPOFOL 10 MG/ML IV EMUL
INTRAVENOUS | Status: DC | PRN
  Administered 2011-05-23: 25 ug/kg/min via INTRAVENOUS

## 2011-05-23 MED ORDER — FENTANYL CITRATE 0.05 MG/ML IJ SOLN
INTRAMUSCULAR | Status: AC
  Filled 2011-05-23: qty 2

## 2011-05-23 MED ORDER — BISACODYL 10 MG RE SUPP
10.0000 mg | Freq: Every day | RECTAL | Status: DC
  Filled 2011-05-23: qty 1

## 2011-05-23 SURGICAL SUPPLY — 56 items
BAG HAMPER (MISCELLANEOUS) ×2 IMPLANT
BAG URINE DRAINAGE (UROLOGICAL SUPPLIES) ×1 IMPLANT
BIT DRILL ACE CANN 5.5 (BIT) ×1 IMPLANT
BLADE HEX COATED 2.75 (ELECTRODE) ×1 IMPLANT
BLADE SURG SZ10 CARB STEEL (BLADE) ×4 IMPLANT
CHLORAPREP W/TINT 26ML (MISCELLANEOUS) ×2 IMPLANT
CLOTH BEACON ORANGE TIMEOUT ST (SAFETY) ×2 IMPLANT
COVER LIGHT HANDLE STERIS (MISCELLANEOUS) ×4 IMPLANT
COVER MAYO STAND XLG (DRAPE) ×2 IMPLANT
DRAPE ORTHO 2.5IN SPLIT 77X108 (DRAPES) IMPLANT
DRAPE ORTHO SPLIT 77X108 STRL (DRAPES)
DRAPE STERI IOBAN 125X83 (DRAPES) ×2 IMPLANT
DRESSING ALLEVYN BORDER HEEL (GAUZE/BANDAGES/DRESSINGS) ×1 IMPLANT
DRSG MEPILEX BORDER 4X12 (GAUZE/BANDAGES/DRESSINGS) ×2 IMPLANT
DRSG TEGADERM 4X4.75 (GAUZE/BANDAGES/DRESSINGS) ×2 IMPLANT
GLOVE ECLIPSE 6.5 STRL STRAW (GLOVE) ×1 IMPLANT
GLOVE EXAM NITRILE MD LF STRL (GLOVE) ×1 IMPLANT
GLOVE INDICATOR 7.0 STRL GRN (GLOVE) ×3 IMPLANT
GLOVE OPTIFIT SS 6.5 STRL BRWN (GLOVE) ×1 IMPLANT
GLOVE SKINSENSE NS SZ8.0 LF (GLOVE) ×1
GLOVE SKINSENSE STRL SZ8.0 LF (GLOVE) ×1 IMPLANT
GLOVE SS N UNI LF 8.5 STRL (GLOVE) ×4 IMPLANT
GOWN BRE IMP SLV AUR XL STRL (GOWN DISPOSABLE) ×5 IMPLANT
GOWN STRL REIN XL XLG (GOWN DISPOSABLE) ×2 IMPLANT
INST SET MAJOR BONE (KITS) ×2 IMPLANT
KIT BLADEGUARD II DBL (SET/KITS/TRAYS/PACK) ×1 IMPLANT
KIT ROOM TURNOVER APOR (KITS) ×2 IMPLANT
MANIFOLD NEPTUNE II (INSTRUMENTS) ×2 IMPLANT
MARKER SKIN DUAL TIP RULER LAB (MISCELLANEOUS) ×2 IMPLANT
NDL HYPO 21X1.5 SAFETY (NEEDLE) ×1 IMPLANT
NDL SPNL 18GX3.5 QUINCKE PK (NEEDLE) IMPLANT
NEEDLE HYPO 21X1.5 SAFETY (NEEDLE) ×2 IMPLANT
NEEDLE SPNL 18GX3.5 QUINCKE PK (NEEDLE) ×2 IMPLANT
NS IRRIG 1000ML POUR BTL (IV SOLUTION) ×2 IMPLANT
PACK BASIC III (CUSTOM PROCEDURE TRAY) ×2
PACK SRG BSC III STRL LF ECLPS (CUSTOM PROCEDURE TRAY) ×1 IMPLANT
PAD ABD 5X9 TENDERSORB (GAUZE/BANDAGES/DRESSINGS) IMPLANT
PENCIL HANDSWITCHING (ELECTRODE) ×2 IMPLANT
PIN THREADED GUIDE ACE (PIN) ×3 IMPLANT
SCREW CANN 6.5 90MM (Screw) ×4 IMPLANT
SCREW CANN 6.5 95MM (Screw) ×2 IMPLANT
SCREW CANN LG 6.5 FLT 90X22 (Screw) IMPLANT
SCREW CANN LG 6.5 FLT 95X22 (Screw) IMPLANT
SET BASIN LINEN APH (SET/KITS/TRAYS/PACK) ×2 IMPLANT
SPONGE LAP 18X18 X RAY DECT (DISPOSABLE) ×2 IMPLANT
STAPLER VISISTAT 35W (STAPLE) ×2 IMPLANT
SUT BRALON NAB BRD #1 30IN (SUTURE) ×3 IMPLANT
SUT MNCRL 0 VIOLET CTX 36 (SUTURE) IMPLANT
SUT MON AB 0 CT1 (SUTURE) ×1 IMPLANT
SUT MON AB 2-0 CT1 36 (SUTURE) ×2 IMPLANT
SUT MONOCRYL 0 CTX 36 (SUTURE) ×1
SYR 30ML LL (SYRINGE) ×2 IMPLANT
SYR BULB IRRIGATION 50ML (SYRINGE) ×4 IMPLANT
TOWEL OR 17X26 4PK STRL BLUE (TOWEL DISPOSABLE) ×1 IMPLANT
WASHER ACECAN 6.5 (Washer) ×1 IMPLANT
YANKAUER SUCT BULB TIP 10FT TU (MISCELLANEOUS) ×2 IMPLANT

## 2011-05-23 NOTE — Anesthesia Postprocedure Evaluation (Signed)
  Anesthesia Post-op Note  Patient: Holly Sloan  Procedure(s) Performed:  CANNULATED HIP PINNING  Patient Location: PACU  Anesthesia Type: Spinal  Level of Consciousness: awake, alert  and oriented  Airway and Oxygen Therapy: Patient Spontanous Breathing and Patient connected to face mask oxygen  Post-op Pain: none  Post-op Assessment: Post-op Vital signs reviewed, Patient's Cardiovascular Status Stable and Respiratory Function Stable, No motor yet. Patient slightly cold/shiverrrrrring.  Post-op Vital Signs: Reviewed  Complications: No apparent anesthesia complications

## 2011-05-23 NOTE — H&P (Signed)
Holly Sloan is an 77 y.o. female.   Chief Complaint: LEFT hip pain HPI: 77 year old female presents after falling over a heater at her home.  She was found in the floor by her daughter who lives with her.  She has been having balance issues lately.  The daughter has moved in with her mother.  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  She's been in good health.  He complains of LEFT thigh pain which is radiating from the hip into the thigh and over the greater trochanter.  The pain is severe when she tries to move her leg but mild when her leg is still.  The pain is described as deep and aching.  Past Medical History  Diagnosis Date  . CHF (congestive heart failure)   . Hypertension   . Arthritis   . Kidney stone     History reviewed. No pertinent past surgical history.  History reviewed. No pertinent family history. Social History:  reports that she has never smoked. She does not have any smokeless tobacco history on file. She reports that she does not drink alcohol or use illicit drugs.  Allergies: No Known Allergies  Medications Prior to Admission  Medication Dose Route Frequency Provider Last Rate Last Dose  . 0.9 %  sodium chloride infusion   Intravenous Continuous Fuller Canada, MD 50 mL/hr at 05/23/11 0418    . amLODipine (NORVASC) tablet 2.5 mg  2.5 mg Oral QHS Fuller Canada, MD   2.5 mg at 05/22/11 2159  . furosemide (LASIX) tablet 40 mg  40 mg Oral Daily Fuller Canada, MD   40 mg at 05/22/11 1847  . morphine 4 MG/ML injection 4 mg  4 mg Intravenous Once Doug Sou, MD   4 mg at 05/22/11 1001  . morphine 4 MG/ML injection 4 mg  4 mg Intravenous Q4H PRN Fuller Canada, MD   4 mg at 05/23/11 0416  . potassium chloride (K-DUR,KLOR-CON) CR tablet 10 mEq  10 mEq Oral Daily Fuller Canada, MD   10 mEq at 05/22/11 1846   No current outpatient prescriptions on file as of 05/23/2011.     Results for orders placed during the hospital encounter of 05/22/11 (from the past 48 hour(s))  BASIC METABOLIC PANEL     Status: Abnormal   Collection Time   05/22/11 11:24 AM      Component Value Range Comment   Sodium 142  135 - 145 (mEq/L)    Potassium 4.5  3.5 - 5.1 (mEq/L)    Chloride 107  96 - 112 (mEq/L)    CO2 27  19 - 32 (mEq/L)    Glucose, Bld 107 (*) 70 - 99 (mg/dL)    BUN 14  6 - 23 (mg/dL)    Creatinine, Ser 1.61  0.50 - 1.10 (mg/dL)    Calcium 09.6  8.4 - 10.5 (mg/dL)    GFR calc non Af Amer 59 (*) >90 (mL/min)    GFR calc Af Amer 69 (*) >90 (mL/min)   CBC     Status: Abnormal   Collection Time   05/22/11 11:24 AM      Component Value Range Comment   WBC 16.6 (*) 4.0 - 10.5 (K/uL)    RBC 4.61  3.87 - 5.11 (MIL/uL)    Hemoglobin 13.4  12.0 - 15.0 (g/dL)    HCT 04.5  40.9 - 81.1 (%)  MCV 91.1  78.0 - 100.0 (fL)    MCH 29.1  26.0 - 34.0 (pg)    MCHC 31.9  30.0 - 36.0 (g/dL)    RDW 16.1  09.6 - 04.5 (%)    Platelets 182  150 - 400 (K/uL)   TYPE AND SCREEN     Status: Normal   Collection Time   05/22/11 11:35 AM      Component Value Range Comment   ABO/RH(D) A NEG      Antibody Screen NEG      Sample Expiration 05/25/2011     URINALYSIS, ROUTINE W REFLEX MICROSCOPIC     Status: Abnormal   Collection Time   05/22/11 12:10 PM      Component Value Range Comment   Color, Urine YELLOW  YELLOW     Appearance CLEAR  CLEAR     Specific Gravity, Urine 1.010  1.005 - 1.030     pH 7.0  5.0 - 8.0     Glucose, UA NEGATIVE  NEGATIVE (mg/dL)    Hgb urine dipstick SMALL (*) NEGATIVE     Bilirubin Urine NEGATIVE  NEGATIVE     Ketones, ur 15 (*) NEGATIVE (mg/dL)    Protein, ur NEGATIVE  NEGATIVE (mg/dL)    Urobilinogen, UA 0.2  0.0 - 1.0 (mg/dL)    Nitrite POSITIVE (*) NEGATIVE     Leukocytes, UA MODERATE (*) NEGATIVE    URINE MICROSCOPIC-ADD ON     Status: Abnormal   Collection Time   05/22/11 12:10 PM      Component Value Range Comment   WBC, UA 7-10  <3 (WBC/hpf)     RBC / HPF 3-6  <3 (RBC/hpf)    Bacteria, UA MANY (*) RARE     Dg Chest 1 View  05/22/2011  *RADIOLOGY REPORT*  Clinical Data: Fall, altered mental status, left femoral neck fracture  CHEST - 1 VIEW  Comparison: 10/15/2009  Findings: Chronic elevation right diaphragm. Enlargement of cardiac silhouette. Tortuous aorta. Slight pulmonary vascular cephalization. Chronic bronchitic changes. Chronic right basilar atelectasis. No definite acute infiltrate or effusion. Bones appear demineralized.  IMPRESSION: Enlargement of cardiac silhouette. Chronic bronchitic changes and right basilar atelectasis.  Original Report Authenticated By: Lollie Marrow, M.D.   Dg Hip Complete Left  05/22/2011  *RADIOLOGY REPORT*  Clinical Data: Fall, altered mental status  LEFT HIP - COMPLETE 2+ VIEW  Comparison: None  Findings: Diffuse osseous demineralization. Minimally displaced subcapital fracture left femoral neck. No dislocation. No additional focal bony abnormalities identified.  IMPRESSION: Osseous demineralization. Minimally displaced subcapital fracture proximal left femur.  Per CMS PQRS reporting requirements (PQRS Measure 24): Given the patient's age of greater than 50 and the fracture site (hip, distal radius, or spine), the patient should be tested for osteoporosis using DXA, and the appropriate treatment considered based on the DXA results.  Original Report Authenticated By: Lollie Marrow, M.D.    Review of Systems  Constitutional: Negative for fever.  HENT: Positive for hearing loss.   Eyes: Negative for blurred vision.  Respiratory: Negative for cough.   Cardiovascular: Negative for chest pain.  Gastrointestinal: Negative for heartburn.  Genitourinary: Negative for dysuria.  Musculoskeletal: Positive for falls. Negative for myalgias.  Skin: Negative for itching and rash.  Neurological: Negative for dizziness and headaches.  Endo/Heme/Allergies: Does not bruise/bleed easily.  Psychiatric/Behavioral:  Negative for depression.    Blood pressure 137/80, pulse 77, temperature 97.3 F (36.3 C), temperature source Oral, resp. rate 20, height 5\' 6"  (1.676  m), weight 90.719 kg (200 lb), SpO2 90.00%. Physical Exam  Constitutional: She is oriented to person, place, and time. She appears well-developed and well-nourished.  HENT:  Head: Normocephalic and atraumatic.  Eyes: Conjunctivae are normal.  Neck: Neck supple.  Cardiovascular: Normal rate and regular rhythm.   Respiratory: Effort normal.  GI: Soft.  Musculoskeletal:       Right shoulder: Normal.       Left shoulder: Normal.       Right hip: Normal.       Left hip: She exhibits decreased range of motion, tenderness, bony tenderness and deformity.       Right ankle: Normal.       Left ankle: Normal.       Right foot: Normal.       Left foot: Normal.  Lymphadenopathy:       Right cervical: No superficial cervical adenopathy present.      Left cervical: No superficial cervical adenopathy present.  Neurological: She is alert and oriented to person, place, and time. She has normal strength.  Skin: Skin is warm and dry.  Psychiatric: She has a normal mood and affect. Her behavior is normal. Judgment and thought content normal.     Assessment/Plan X-rays were done in the emergency room, they are of inadequate quality to assess the fracture fully.  On the AP view the fracture appears to be valgus impacted.  On the lateral view we cannot tell what type of fracture this is.  I have explained this to the patient and her daughter.  We will put the patient on the fracture table and then assessed the fracture for internal fixation vs. Partial hip replacement  You have been scheduled for surgery.  All surgeries carry some risk.  Remember you always have the option of continued nonsurgical treatment. However in this situation the risks vs. the benefits favor surgery as the best treatment option. The risks of the surgery includes the following but is  not limited to bleeding, infection, pulmonary embolus, death from anesthesia, nerve injury vascular injury or need for further surgery, continued pain.  Specific to this procedure the following risks and complications are rare but possible Internal fixation in this setting is a 15% avascular necrosis rate and nonunion rate which would require hip replacement.  Hip replacement partial has a small dislocation risk and is slightly more extensive surgery with more extensive bleeding.  Surgical plan internal fixation vs. Partial hip replacement LEFT hip.   Fuller Canada 05/23/2011, 8:12 AM

## 2011-05-23 NOTE — Brief Op Note (Signed)
05/22/2011 - 05/23/2011  12:37 PM  PATIENT:  Rosaria Ferries  77 y.o. female  PRE-OPERATIVE DIAGNOSIS:  Fracture Left hip  POST-OPERATIVE DIAGNOSIS:  Fracture Left hip  PROCEDURE:  Procedure(s): CANNULATED  Left HIP PINNING  IMPLANTS 3 ASNIS CANNULATED SCREWS   SURGEON:  Surgeon(s): Fuller Canada, MD  PHYSICIAN ASSISTANT:   ASSISTANTS: DEBBIE DALLAS  ANESTHESIA:   spinal  EBL:  Total I/O In: 1887.5 [I.V.:1887.5] Out: 525 [Urine:500; Blood:25]  BLOOD ADMINISTERED:none  DRAINS: none   LOCAL MEDICATIONS USED:  MARCAINE W EPI 60CC  SPECIMEN:  No Specimen  DISPOSITION OF SPECIMEN:  N/A  COUNTS:  YES  TOURNIQUET:  * No tourniquets in log *  DICTATION: .Dragon Dictation  PLAN OF CARE: Admit to inpatient   PATIENT DISPOSITION:  PACU - hemodynamically stable.   Delay start of Pharmacological VTE agent (>24hrs) due to surgical blood loss or risk of bleeding:  yes

## 2011-05-23 NOTE — Progress Notes (Signed)
UR Chart Review Completed  

## 2011-05-23 NOTE — Op Note (Signed)
Procedure date 05/23/2011  Procedure hip pinning  Preop diagnosis left hip fracture  Postop diagnosis same  Procedure open treatment internal fixation left hip with cannulated screws  Surgeon Romeo Apple, assisted by Valetta Close  Anesthesia spinal  Findings impacted left femoral neck fracture in stable configuration confirmed on AP and lateral intraoperative fluoroscopy  Details of procedure:  The patient's operative site was marked in the preop area. The chart was reviewed and updated. The patient was taken to the operating room for spinal anesthetic. After spinal anesthesia 2 g of Ancef was given based on a weight greater than 80 kg.  The patient was placed on the fracture table with a peroneal post with traction placed on the left lower extremity the right leg was placed in a well leg holder with appropriate padding. Radiographs were then taken to confirm the position of the fracture. The fracture was deemed in stable figure rotation. We proceeded with internal fixation.  After sterile prep and drape and completion of the timeout procedure an incision was made just inferior to the greater trochanter. The incision was deepened through subcutaneous tissue down to the underlying fascia. Electrocautery was used to control bleeding. The fascia was split in line with the skin incision. The vastus lateralis was also split in line with the skin incision. Subperiosteal dissection exposed the lateral femur. A guide pin was placed in the Center of the femoral head on the lateral view and on the inferior neck on the AP view. This was confirmed by radiographs. A triangular configuration guide was then used to place 2 additional pins in the superior portion of the neck. AP and lateral radiographs confirm the position. Each pin was measured, the outer cortex was drilled, and the screws were placed and tightened by hand. Final x-rays confirmed position of the screws including the approach withdrawal  fluoroscopy technique to confirm no penetration into the joint.  The wounds were irrigated and closed in layered fashion with 0 Monocryl #1 Bralon 0 Monocryl and 2-0 Monocryl. Staples were used to reapproximate the skin edges. 30 cc of Marcaine was injected beneath the fascia and 30 in the subcutaneous tissue  The patient can be weightbearing as tolerated with a walker for 6 weeks.

## 2011-05-23 NOTE — Transfer of Care (Signed)
Immediate Anesthesia Transfer of Care Note  Patient: Holly Sloan  Procedure(s) Performed:  CANNULATED HIP PINNING  Patient Location: PACU  Anesthesia Type: Spinal  Level of Consciousness: awake and oriented  Airway & Oxygen Therapy: Patient Spontanous Breathing and Patient connected to face mask oxygen  Post-op Assessment: Report given to PACU RN, Post -op Vital signs reviewed and stable and Patient moving all extremities  Post vital signs: Reviewed  Complications: No apparent anesthesia complications

## 2011-05-23 NOTE — Interval H&P Note (Signed)
History and Physical Interval Note:   05/23/2011   10:29 AM   Holly Sloan  has presented today for surgery, with the diagnosis of Fracture Left hip  The various methods of treatment have been discussed with the patient and family. After consideration of risks, benefits and other options for treatment, the patient has consented to  Procedure(s):left CANNULATED HIP PINNING vs bipolar as a surgical intervention .  I have reviewed the patients' chart and labs.  Questions were answered to the patient's satisfaction.    H&P guidelines Update  The H/P was reviewed, the patient was examined, and there is no change in the patient's condition since the original H/P was completed.  Per Joint commission requirements   Terrance Mass., MD   Fuller Canada  MD

## 2011-05-23 NOTE — Anesthesia Procedure Notes (Addendum)
Spinal Block  Patient location during procedure: OR Start time: 05/23/2011 10:54 AM Staffing CRNA/Resident: Marylene Buerger Preanesthetic Checklist Completed: surgical consent Spinal Block Patient position: left lateral decubitus Prep: Betadine  Spinal Block  End time: 05/23/2011 11:06 AM Spinal Block Approach: left paramedian Location: L3-4 Injection technique: single-shot Needle Needle type: Spinocan  Needle gauge: 22 G Needle length: 9 cm Assessment Sensory level: T4 Additional Notes [id number] 2013-08 Marcaine  12 Mg Fentanyl25Mcg.

## 2011-05-23 NOTE — Anesthesia Preprocedure Evaluation (Addendum)
Anesthesia Evaluation  Name, MR# and DOB Patient awake  General Assessment Comment  Reviewed: Allergy & Precautions, H&P , NPO status , Patient's Chart, lab work & pertinent test results  History of Anesthesia Complications Negative for: history of anesthetic complications  Airway Mallampati: II  Neck ROM: Full    Dental  (+) Missing   Pulmonary (+) shortness of breath    + decreased breath sounds      Cardiovascular hypertension, Pt. on medications +CHF Regular Normal    Neuro/Psych    GI/Hepatic   Endo/Other    Renal/GU      Musculoskeletal   Abdominal   Peds  Hematology   Anesthesia Other Findings   Reproductive/Obstetrics                           Anesthesia Physical Anesthesia Plan  ASA: III  Anesthesia Plan: Spinal   Post-op Pain Management:    Induction:   Airway Management Planned: Nasal Cannula  Additional Equipment:   Intra-op Plan:   Post-operative Plan:   Informed Consent: I have reviewed the patients History and Physical, chart, labs and discussed the procedure including the risks, benefits and alternatives for the proposed anesthesia with the patient or authorized representative who has indicated his/her understanding and acceptance.     Plan Discussed with:   Anesthesia Plan Comments:         Anesthesia Quick Evaluation

## 2011-05-24 LAB — BASIC METABOLIC PANEL
BUN: 13 mg/dL (ref 6–23)
CO2: 26 mEq/L (ref 19–32)
Chloride: 107 mEq/L (ref 96–112)
GFR calc Af Amer: 64 mL/min — ABNORMAL LOW (ref >90)
Glucose, Bld: 110 mg/dL — ABNORMAL HIGH (ref 70–99)
Potassium: 4.1 mEq/L (ref 3.5–5.1)

## 2011-05-24 LAB — CBC
HCT: 37.8 % (ref 36.0–46.0)
Hemoglobin: 12.2 g/dL (ref 12.0–15.0)
MCV: 91.5 fL (ref 78.0–100.0)
RBC: 4.13 MIL/uL (ref 3.87–5.11)
WBC: 9 10*3/uL (ref 4.0–10.5)

## 2011-05-24 MED ORDER — SODIUM CHLORIDE 0.9 % IJ SOLN
INTRAMUSCULAR | Status: AC
  Administered 2011-05-24: 10 mL
  Filled 2011-05-24: qty 10

## 2011-05-24 MED ORDER — BISACODYL 5 MG PO TBEC
10.0000 mg | DELAYED_RELEASE_TABLET | Freq: Every day | ORAL | Status: DC
  Administered 2011-05-24: 10 mg via ORAL
  Filled 2011-05-24: qty 2

## 2011-05-24 NOTE — Progress Notes (Signed)
Physical Therapy Evaluation Patient Name: Holly Sloan Date: 05/24/2011 Problem List:  Patient Active Problem List  Diagnoses  . LIPOMA  . HYPERTENSION, PULMONARY  . DVT  . EDEMA  . DYSPNEA   Past Medical History:  Past Medical History  Diagnosis Date  . CHF (congestive heart failure)   . Hypertension   . Arthritis   . Kidney stone   . Pneumonia     20 years ago   Past Surgical History:  Past Surgical History  Procedure Date  . Appendectomy     as teenager  . Kidney stone surgery jan 2012    stone remopved left side    Precautions/Restrictions  Precautions Precautions: Fall Required Braces or Orthoses: No Restrictions Weight Bearing Restrictions: No Other Position/Activity Restrictions: WBAT Prior Functioning  Home Living Lives With: Daughter Receives Help From: Family Home Layout: One level Home Access: Stairs to enter Entrance Stairs-Rails: Right Entrance Stairs-Number of Steps: 5 Bathroom Shower/Tub: Network engineer: None Prior Function Level of Independence: Independent with basic ADLs Vocation: Retired Financial risk analyst Arousal/Alertness: Awake/alert Overall Cognitive Status: Appears within functional limits for tasks assessed Orientation Level: Oriented X4 Sensation/Coordination Coordination Gross Motor Movements are Fluid and Coordinated: Yes Fine Motor Movements are Fluid and Coordinated: Not tested Extremity Assessment RUE Assessment RUE Assessment: Not tested LUE Assessment LUE Assessment: Not tested RLE Assessment RLE Assessment: Within Functional Limits LLE Assessment LLE Assessment: Exceptions to WFL LLE AROM (degrees) Overall AROM Left Lower Extremity: Deficits;Due to decreased strength;Due to pain LLE Strength LLE Overall Strength: Deficits;Due to pain;Other (Comment) (fracture) Mobility (including Balance) Bed Mobility Bed Mobility: Yes Supine to Sit: 3: Mod  assist Transfers Transfers: Yes Sit to Stand: 2: Max assist Stand to Sit: 3: Mod assist Stand Pivot Transfers: 2: Max assist Ambulation/Gait Ambulation/Gait: No Stairs: No Wheelchair Mobility Wheelchair Mobility: No  Balance Balance Assessed: No Exercise  Total Joint Exercises Ankle Circles/Pumps: AROM;Both;10 reps Quad Sets: AROM;Both;10 reps Gluteal Sets: AROM;Both;10 reps Heel Slides: AAROM;Strengthening;Left;5 reps Hip ABduction/ADduction: AAROM;Left;5 reps General Exercises - Lower Extremity Ankle Circles/Pumps: AROM;Both;10 reps Quad Sets: AROM;Both;10 reps Gluteal Sets: AROM;Both;10 reps Heel Slides: AAROM;Strengthening;Left;5 reps Hip ABduction/ADduction: AAROM;Left;5 reps Low Level/ICU Exercises Ankle Circles/Pumps: AROM;Both;10 reps Quad Sets: AROM;Both;10 reps Hip ABduction/ADduction: AAROM;Left;5 reps Heel Slides: AAROM;Strengthening;Left;5 reps  End of Session PT - End of Session Equipment Utilized During Treatment: Gait belt Activity Tolerance: Patient tolerated treatment well Patient left: in chair;with call bell in reach;with family/visitor present General Behavior During Session: [facility] for tasks performed Cognition: [facility] for tasks performed PT Assessment/Plan/Recommendation PT Assessment Clinical Impression Statement: Pt with decreased independence with mobility,decreased strength who will benefit from skilled therapy to address the above issues PT Recommendation/Assessment: Patient will need skilled PT in the acute care venue PT Problem List: Decreased strength;Decreased activity tolerance;Decreased balance;Decreased mobility;Decreased knowledge of use of DME;Pain PT Therapy Diagnosis : Difficulty walking;Acute pain;Generalized weakness PT Plan PT Frequency: Min 6X/week PT Treatment/Interventions: Gait training;Stair training;Therapeutic exercise;DME instruction PT Recommendation Follow Up Recommendations: Skilled nursing facility Equipment  Recommended: Defer to next venue PT Goals  Acute Rehab PT Goals PT Goal Formulation: With patient Pt will go Supine/Side to Sit: with mod assist Pt will Transfer Sit to Stand/Stand to Sit: with mod assist Pt will Ambulate: 1 - 15 feet;with mod assist;with rolling walker RUSSELL,CINDY 05/24/2011, 11:36 AM

## 2011-05-24 NOTE — Progress Notes (Signed)
Subjective: 1 Day Post-Op Procedure(s) (LRB): CANNULATED HIP PINNING (Left) Patient reports pain as mild.    Objective: Vital signs in last 24 hours: Temp:  [97.4 F (36.3 C)-98.6 F (37 C)] 97.4 F (36.3 C) (10/09 1757) Pulse Rate:  [78-116] 92  (10/09 1757) Resp:  [12-18] 18  (10/09 1757) BP: (107-168)/(44-89) 147/79 mmHg (10/09 1757) SpO2:  [87 %-100 %] 96 % (10/09 2027) FiO2 (%):  [3 %] 3 % (10/09 1523)  Intake/Output from previous day: 10/09 0701 - 10/10 0700 In: 2337.5 [I.V.:2337.5] Out: 1925 [Urine:1900; Blood:25] Intake/Output this shift:     Basename 05/24/11 0525 05/22/11 1124  HGB 12.2 13.4    Basename 05/24/11 0525 05/22/11 1124  WBC 9.0 16.6*  RBC 4.13 4.61  HCT 37.8 42.0  PLT 145* 182    Basename 05/24/11 0525 05/22/11 1124  NA 140 142  K 4.1 4.5  CL 107 107  CO2 26 27  BUN 13 14  CREATININE 0.96 0.90  GLUCOSE 110* 107*  CALCIUM 9.1 10.4   No results found for this basename: LABPT:2,INR:2 in the last 72 hours  Neurologically intact ABD soft Neurovascular intact Sensation intact distally Intact pulses distally Dorsiflexion/Plantar flexion intact  Assessment/Plan: 1 Day Post-Op Procedure(s) (LRB): CANNULATED HIP PINNING (Left) Advance diet Up with therapy  Fuller Canada 05/24/2011, 7:26 AM

## 2011-05-24 NOTE — Anesthesia Postprocedure Evaluation (Signed)
  Anesthesia Post-op Note  Patient: Holly Sloan  Procedure(s) Performed:  CANNULATED HIP PINNING - late entry: updated foley documentation  Patient Location:   Room 341  Anesthesia Type: Spinal  Level of Consciousness: awake, alert  and oriented  Airway and Oxygen Therapy: Patient Spontanous Breathing  Post-op Pain: mild  Post-op Assessment: Post-op Vital signs reviewed and Patient's Cardiovascular Status Stable  Post-op Vital Signs: stable  Complications: No apparent anesthesia complications

## 2011-05-24 NOTE — Progress Notes (Signed)
Occupational Therapy Evaluation Patient Details Name: Holly Sloan MRN: [id number] DOB: Jun 21, 1929 Today's Date: 05/24/2011 OT Evaluation 1515-1530 E Problem List:  Patient Active Problem List  Diagnoses  . LIPOMA  . HYPERTENSION, PULMONARY  . DVT  . EDEMA  . DYSPNEA    Past Medical History:  Past Medical History  Diagnosis Date  . CHF (congestive heart failure)   . Hypertension   . Arthritis   . Kidney stone   . Pneumonia     20 years ago   Past Surgical History:  Past Surgical History  Procedure Date  . Appendectomy     as teenager  . Kidney stone surgery jan 2012    stone remopved left side    OT Assessment/Plan/Recommendation OT Assessment Clinical Impression Statement: A:  Patient presents with decreased I with B/IADLs due to recent fall and subsequent left hip fracture.   OT Recommendation/Assessment: Patient will need skilled OT in the acute care venue OT Problem List: Decreased activity tolerance;Decreased safety awareness;Decreased knowledge of use of DME or AE;Pain OT Therapy Diagnosis : Acute pain OT Plan OT Frequency: Min 2X/week OT Treatment/Interventions: Self-care/ADL training;Therapeutic exercise;DME and/or AE instruction;Patient/family education OT Recommendation Follow Up Recommendations: Skilled nursing facility Equipment Recommended: Defer to next venue Individuals Consulted Consulted and Agree with Results and Recommendations: Patient OT Goals Acute Rehab OT Goals OT Goal Formulation: With patient Time For Goal Achievement: 2 weeks ADL Goals Pt Will Perform Grooming: with supervision;Standing at sink Pt Will Perform Lower Body Bathing: with min assist;with adaptive equipment;Sitting, chair Pt Will Perform Lower Body Dressing: Sitting, bed;with min assist;with adaptive equipment Miscellaneous OT Goals Miscellaneous OT Goal #1: Patient will increase activity tolerance to good for increased safety and independence with  BADLs. Miscellaneous OT Goal #2: Patient will be educated on and be able to return demonstration on use of AE for lower body dressing and bathing.  OT Evaluation Precautions/Restrictions  Precautions Precautions: Fall Required Braces or Orthoses: No Restrictions Weight Bearing Restrictions: No Other Position/Activity Restrictions: WBAT LLE Prior Functioning Home Living Type of Home: House Lives With: Daughter Receives Help From: Family Home Layout: One level Home Access: Stairs to enter Bathroom Shower/Tub: Engineer, manufacturing systems: Standard Home Adaptive Equipment: None Prior Function Level of Independence: Independent with basic ADLs;Independent with homemaking with ambulation;Independent with transfers Driving: Yes Vocation: Retired Comments: Prior to fall, she was I with b/iadls, driving, etc ADL ADL Eating/Feeding: Simulated;Set up Grooming: Brushing hair;Performed;Supervision/safety Where Assessed - Grooming: Supine, head of bed up Lower Body Bathing: Moderate assistance;Simulated Lower Body Dressing: Simulated;Moderate assistance Where Assessed - Lower Body Dressing: Sitting, bed ADL Comments: Discussed/educated patient on use of adaptive equipment for lower extremity bathing and dressing.  Patient has a Sports administrator, I verbally walked her through how to use it to don and doff clothing on her LLE.  Discussed use of a shower seat for  bathing. Vision/Perception  Vision - History Baseline Vision: No visual deficits Cognition Cognition Arousal/Alertness: Awake/alert Overall Cognitive Status: Appears within functional limits for tasks assessed Orientation Level: Disoriented to person Sensation/Coordination Sensation Light Touch: Appears Intact Stereognosis: Appears Intact Hot/Cold: Appears Intact Proprioception: Appears Intact Coordination Gross Motor Movements are Fluid and Coordinated: Yes Fine Motor Movements are Fluid and Coordinated: Yes Extremity  Assessment RUE Assessment RUE Assessment: Within Functional Limits LUE Assessment LUE Assessment: Within Functional Limits   End of Session OT - End of Session Activity Tolerance: Patient tolerated treatment well Patient left: in bed;with family/visitor present;with call bell in reach General Behavior During  Session: [facility] for tasks performed Cognition: [facility] for tasks performed   Shirlean Mylar, OTR/L  05/24/2011, 3:56 PM

## 2011-05-24 NOTE — Consult Note (Signed)
CSW presented bed offers to pt as PT recommended skilled and pt is agreeable.  Pt accepts [facility] and facility notified.  PT notes sent to [facility] to initiate [facility] authorization.  Awaiting stability for d/c and insurance authorization.  Karn Cassis

## 2011-05-24 NOTE — Progress Notes (Signed)
Physical Therapy Treatment Patient Name: Holly Sloan'X Date: 05/24/2011  BJYN:[id number]/ 1 TE 1 GT Problem List:  Patient Active Problem List  Diagnoses  . LIPOMA  . HYPERTENSION, PULMONARY  . DVT  . EDEMA  . DYSPNEA   Past Medical History:  Past Medical History  Diagnosis Date  . CHF (congestive heart failure)   . Hypertension   . Arthritis   . Kidney stone   . Pneumonia     20 years ago   Past Surgical History:  Past Surgical History  Procedure Date  . Appendectomy     as teenager  . Kidney stone surgery jan 2012    stone remopved left side   Precautions/Restrictions  Precautions Precautions: Fall Required Braces or Orthoses: No Restrictions Weight Bearing Restrictions: No Other Position/Activity Restrictions: WBAT Mobility (including Balance) Bed Mobility Bed Mobility: Yes Supine to Sit: 3: Mod assist Transfers Transfers: Yes Sit to Stand: 3: Mod assist Sit to Stand Details (indicate cue type and reason): Vcs for hand placement and assistance with trunk over LEs Stand to Sit: 4: Min assist Stand to Sit Details: VCs for hand placement Stand Pivot Transfers: 2: Max assist Ambulation/Gait Ambulation/Gait: Yes Ambulation/Gait Assistance: 4: Min assist Ambulation/Gait Assistance Details (indicate cue type and reason): RW;VCs for sequencing of RW, redistribute weight of LLE using UE  Ambulation Distance (Feet): 3 Feet Assistive device: Rolling walker Gait Pattern: Antalgic Gait velocity: slow Stairs: No Wheelchair Mobility Wheelchair Mobility: No  Balance Balance Assessed: No Exercise  Total Joint Exercises Ankle Circles/Pumps: Both;20 reps Quad Sets: Both;10 reps Gluteal Sets: 10 reps;Both Heel Slides: AAROM;Strengthening;Left;5 reps Hip ABduction/ADduction: Left;10 reps;AAROM General Exercises - Lower Extremity Ankle Circles/Pumps: Both;20 reps Quad Sets: Both;10 reps Gluteal Sets: 10 reps;Both Heel Slides: AAROM;Strengthening;Left;5  reps Hip ABduction/ADduction: Left;10 reps;AAROM Low Level/ICU Exercises Ankle Circles/Pumps: Both;20 reps Quad Sets: Both;10 reps Hip ABduction/ADduction: Left;10 reps;AAROM Heel Slides: AAROM;Strengthening;Left;5 reps  End of Session PT - End of Session Equipment Utilized During Treatment: Gait belt (RW) Activity Tolerance: Patient limited by fatigue Patient left: in bed;with call bell in reach;with family/visitor present Buyer, retail made aware bed alarm not working) Geophysical data processor During Session: [facility] for tasks performed Cognition: [facility] for tasks performed PT Assessment/Plan  PT - Assessment/Plan Comments on Treatment Session: Pt had improved mobility this afternoon;able to amb 3' with RW and Min/Mod A for sequencing of RW and hand placemnt; recliner<>bed, although this did fatique patient PT Frequency: Min 6X/week Follow Up Recommendations: Skilled nursing facility Equipment Recommended: Defer to next venue PT Goals  Acute Rehab PT Goals PT Goal Formulation: With patient Pt will go Supine/Side to Sit: with mod assist Pt will Transfer Sit to Stand/Stand to Sit: with mod assist Pt will Ambulate: 1 - 15 feet;with mod assist;with rolling walker  Risa Auman ATKINSO 05/24/2011, 2:18 PM

## 2011-05-25 DIAGNOSIS — N390 Urinary tract infection, site not specified: Secondary | ICD-10-CM | POA: Diagnosis present

## 2011-05-25 LAB — URINE CULTURE
Colony Count: >=100000
Culture  Setup Time: 201210091210
Special Requests: NORMAL

## 2011-05-25 LAB — CBC
HCT: 38 % (ref 36.0–46.0)
Hemoglobin: 12.6 g/dL (ref 12.0–15.0)
MCH: 30.1 pg (ref 26.0–34.0)
MCHC: 33.2 g/dL (ref 30.0–36.0)
MCV: 90.9 fL (ref 78.0–100.0)

## 2011-05-25 LAB — BASIC METABOLIC PANEL
BUN: 13 mg/dL (ref 6–23)
Creatinine, Ser: 0.82 mg/dL (ref 0.50–1.10)
GFR calc non Af Amer: 66 mL/min — ABNORMAL LOW (ref >90)
Glucose, Bld: 123 mg/dL — ABNORMAL HIGH (ref 70–99)
Potassium: 4.2 mEq/L (ref 3.5–5.1)

## 2011-05-25 MED ORDER — CIPROFLOXACIN HCL 250 MG PO TABS
250.0000 mg | ORAL_TABLET | Freq: Two times a day (BID) | ORAL | Status: DC
  Administered 2011-05-25 – 2011-05-26 (×3): 250 mg via ORAL
  Filled 2011-05-25 (×3): qty 1

## 2011-05-25 MED ORDER — SODIUM CHLORIDE 0.9 % IJ SOLN
INTRAMUSCULAR | Status: AC
  Administered 2011-05-25: 10 mL
  Filled 2011-05-25: qty 10

## 2011-05-25 NOTE — Progress Notes (Signed)
Physical Therapy Treatment Patient Details Name: SAHER DAVEE MRN: [id number] DOB: [birth year] Today's Date: 05/25/2011  PT Assessment/Plan  Attempted therapy and patient refused due to "massive diarehha" episodes in the last hour and not feeling well enough to do therapy. Pt and family member reported pt had been up to [facility] x3 in the last hour and is exhausted. Educated pt on exercises she could do in the recliner as she felt better, will see pt in the am PT Goals     PT Treatment Precautions/Restrictions  Precautions Precautions: Fall Required Braces or Orthoses: No Restrictions Weight Bearing Restrictions: No Other Position/Activity Restrictions: WBAT LLE Mobility (including Balance)      Exercise    End of Session    Islah Eve ATKINSO 05/25/2011, 12:49 PM

## 2011-05-25 NOTE — Progress Notes (Signed)
Patient would like to discontinue Colace as stools are loose.

## 2011-05-25 NOTE — Progress Notes (Signed)
Subjective: 2 Days Post-Op Procedure(s) (LRB): CANNULATED HIP PINNING (Left) Patient reports pain as mild.    Objective: Vital signs in last 24 hours: Temp:  [98.2 F (36.8 C)-98.9 F (37.2 C)] 98.6 F (37 C) (10/11 0400) Pulse Rate:  [77-86] 82  (10/11 0400) Resp:  [18-20] 18  (10/11 0400) BP: (124-154)/(72-80) 154/80 mmHg (10/11 0400) SpO2:  [94 %-98 %] 95 % (10/11 0400)  Intake/Output from previous day:   Intake/Output this shift:     Basename 05/25/11 0613 05/24/11 0525 05/22/11 1124  HGB 12.6 12.2 13.4    Basename 05/25/11 0613 05/24/11 0525  WBC 9.0 9.0  RBC 4.18 4.13  HCT 38.0 37.8  PLT 140* 145*    Basename 05/25/11 0613 05/24/11 0525  NA 139 140  K 4.2 4.1  CL 107 107  CO2 27 26  BUN 13 13  CREATININE 0.82 0.96  GLUCOSE 123* 110*  CALCIUM 9.4 9.1   No results found for this basename: LABPT:2,INR:2 in the last 72 hours  Neurologically intact Neurovascular intact Sensation intact distally Intact pulses distally Dorsiflexion/Plantar flexion intact  Assessment/Plan: 2 Days Post-Op Procedure(s) (LRB): CANNULATED HIP PINNING (Left) Plan for discharge tomorrow  Holly Sloan 05/25/2011, 7:37 AM

## 2011-05-26 ENCOUNTER — Inpatient Hospital Stay
Admission: EM | Admit: 2011-05-26 | Discharge: 2011-06-30 | Disposition: A | Discharge status: Home / Self Care | Payer: Medicare HMO | Source: Ambulatory Visit | Attending: Internal Medicine | Admitting: Internal Medicine

## 2011-05-26 DIAGNOSIS — S72002A Fracture of unspecified part of neck of left femur, initial encounter for closed fracture: Secondary | ICD-10-CM | POA: Diagnosis present

## 2011-05-26 DIAGNOSIS — S72009A Fracture of unspecified part of neck of unspecified femur, initial encounter for closed fracture: Secondary | ICD-10-CM | POA: Diagnosis present

## 2011-05-26 DIAGNOSIS — R609 Edema, unspecified: Principal | ICD-10-CM

## 2011-05-26 LAB — TYPE AND SCREEN: Unit division: 0

## 2011-05-26 MED ORDER — CIPROFLOXACIN HCL 250 MG PO TABS: 250.0000 mg | ORAL_TABLET | Freq: Two times a day (BID) | ORAL | Status: AC

## 2011-05-26 MED ORDER — HYDROCODONE-ACETAMINOPHEN 5-325 MG PO TABS: 1.0000 | ORAL_TABLET | ORAL | Status: AC | PRN

## 2011-05-26 NOTE — Progress Notes (Signed)
Writer called report to Troy Sine, LPN at [facility].

## 2011-05-26 NOTE — Progress Notes (Signed)
Writer called report to Troy Sine, LPN at [facility].  Packet took over by staff member and wheel chaired over to [facility] rm 103 in no distress and stable condition.  Family took all belongings over to [facility].

## 2011-05-26 NOTE — Consults (Signed)
Pt to D/C today to [facility].  CSW spoke with Pt's family and with staff at [facility].  Both are in agreement with plan. Pt to be transported by hospital staff.  CSW will sign off at this time.

## 2011-05-26 NOTE — Progress Notes (Signed)
Subjective: 3 Days Post-Op Procedure(s) (LRB): CANNULATED HIP PINNING (Left) Patient reports pain as mild.    Objective: Vital signs in last 24 hours: Temp:  [97.6 F (36.4 C)-98.6 F (37 C)] 98.6 F (37 C) (10/12 0400) Pulse Rate:  [67-87] 67  (10/12 0400) Resp:  [18-22] 22  (10/12 0400) BP: (99-150)/(64-91) 99/64 mmHg (10/12 0400) SpO2:  [91 %-95 %] 92 % (10/12 0730)  Intake/Output from previous day: 10/11 0701 - 10/12 0700 In: 1445.8 [P.O.:1200; I.V.:245.8] Out: 1 [Stool:1] Intake/Output this shift:     [facility] 05/25/11 0613 05/24/11 0525  HGB 12.6 12.2    Basename 05/25/11 0613 05/24/11 0525  WBC 9.0 9.0  RBC 4.18 4.13  HCT 38.0 37.8  PLT 140* 145*    Basename 05/25/11 0613 05/24/11 0525  NA 139 140  K 4.2 4.1  CL 107 107  CO2 27 26  BUN 13 13  CREATININE 0.82 0.96  GLUCOSE 123* 110*  CALCIUM 9.4 9.1   No results found for this basename: LABPT:2,INR:2 in the last 72 hours  Neurologically intact Neurovascular intact Sensation intact distally Intact pulses distally Dorsiflexion/Plantar flexion intact Incision: no drainage  Assessment/Plan: 3 Days Post-Op Procedure(s) (LRB): CANNULATED HIP PINNING (Left) PLAN D/C TO SNF   Fuller Canada 05/26/2011, 8:19 AM

## 2011-05-26 NOTE — Discharge Summary (Signed)
Physician Discharge Summary  Sloan ID: Holly Sloan MRN: [id number] DOB/AGE: 75/12/1928 77 y.o.  Admit date: 05/22/2011 Discharge date: 05/26/2011  Admission Diagnoses: LEFT HIP FRACTURE   Discharge Diagnoses: LEFT HIP FRACTURE #2 UTI Principal Problem:  *Hip fracture Active Problems:  UTI (lower urinary tract infection)  Fracture of left hip   Discharged Condition: stable  Hospital Course: Holly Sloan was admitted on Monday, October 8 with a left hip fracture. She had surgery on Tuesday, October 9. She tolerated Holly surgery well under spinal anesthetic with less than 100 cc blood loss. In Holly perioperative period she was found to have a urinary tract infection. When she came in her urine was cloudy with many bacteria and we cultured it and it was found to be positive for Klebsiella sensitive to Cipro. Consults: none  Significant Diagnostic Studies: labs: Discharge hemoglobin is 12. Again positive urine culture for Klebsiella.  Treatments: IV hydration, anticoagulation: lovenox, therapies: PT and surgery: otif left hip with asnis cannulated screws   Discharge Exam: Blood pressure 99/64, pulse 67, temperature 98.6 F (37 C), temperature source Oral, resp. rate 22, height 5\' 6"  (1.676 m), weight 90.719 kg (200 lb), SpO2 92.00%. General appearance: alert, cooperative and no distress Extremities: extremities normal, atraumatic, no cyanosis or edema, Homans sign is negative, no sign of DVT, no edema, redness or tenderness in Holly calves or thighs and no ulcers, gangrene or trophic changes Pulses: 2+ and symmetric Skin: Skin color, texture, turgor normal. No rashes or lesions Neurologic: Mental status: Alert, oriented, thought content appropriate Incision/Wound: clean dry and intact  Disposition:  [facility]   Current Discharge Medication List  Hydrocodone 5-325mg  1-2 po q 4 hrs prn pain  cipro 250 mg po bid x 10 days     CONTINUE these medications which have NOT  CHANGED   Details  acetaminophen (TYLENOL) 500 MG tablet Take 500 mg by mouth every 6 (six) hours as needed. pain     amLODipine (NORVASC) 2.5 MG tablet Take 2.5 mg by mouth at bedtime.      furosemide (LASIX) 40 MG tablet Take 40 mg by mouth daily.      potassium chloride (KLOR-CON) 10 MEQ CR tablet Take 10 mEq by mouth daily.         Follow-up Information    Follow up with Fuller Canada, MD. Call on 05/26/2011. (MAKE APPT FOR  06/05/2011)    Contact information:   [address], [address] [place] [address] [phone]          Signed: Ammy Lienhard 05/26/2011, 8:26 AM

## 2011-05-29 ENCOUNTER — Ambulatory Visit (HOSPITAL_COMMUNITY): Payer: Medicare HMO | Attending: Internal Medicine

## 2011-06-02 ENCOUNTER — Encounter (HOSPITAL_COMMUNITY): Payer: Self-pay | Admitting: Orthopedic Surgery

## 2011-06-05 ENCOUNTER — Encounter: Payer: Self-pay | Admitting: Orthopedic Surgery

## 2011-06-05 ENCOUNTER — Ambulatory Visit (INDEPENDENT_AMBULATORY_CARE_PROVIDER_SITE_OTHER): Payer: Medicare HMO | Admitting: Orthopedic Surgery

## 2011-06-05 VITALS — Ht 66.0 in | Wt 200.0 lb

## 2011-06-05 DIAGNOSIS — S72009A Fracture of unspecified part of neck of unspecified femur, initial encounter for closed fracture: Secondary | ICD-10-CM

## 2011-06-05 DIAGNOSIS — S72002A Fracture of unspecified part of neck of left femur, initial encounter for closed fracture: Secondary | ICD-10-CM

## 2011-06-05 NOTE — Progress Notes (Signed)
Followup  LEFT hip fracture.  LEFT hip cannulated screw fixation for femoral neck fracture.  Date of procedure October 9  Patient is currently at the [facility].  Progressing slowly but progressing.  We would like her Staples to be removed today.  Followup x-ray in 4 weeks

## 2011-06-05 NOTE — Patient Instructions (Signed)
Continue PT

## 2011-07-04 ENCOUNTER — Ambulatory Visit: Payer: Medicare HMO | Admitting: Orthopedic Surgery

## 2011-07-11 ENCOUNTER — Encounter: Payer: Self-pay | Admitting: Orthopedic Surgery

## 2011-07-11 ENCOUNTER — Ambulatory Visit (INDEPENDENT_AMBULATORY_CARE_PROVIDER_SITE_OTHER): Payer: Medicare HMO | Admitting: Orthopedic Surgery

## 2011-07-11 VITALS — BP 140/90 | Ht 66.0 in | Wt 202.0 lb

## 2011-07-11 DIAGNOSIS — S72009A Fracture of unspecified part of neck of unspecified femur, initial encounter for closed fracture: Secondary | ICD-10-CM

## 2011-07-11 NOTE — Progress Notes (Signed)
Followup  LEFT hip fracture.  LEFT hip cannulated screw fixation for femoral neck fracture.  Date of procedure October 9  Patient is at home with home PT     The incision healed nicely. There were no signs of infection.  X-ray was taken fractures, healing well.  X-ray in 6 weeks for 12 week x-ray.  Separate x-ray report.  2 views, AP and frog lateral LEFT hip.  Findings 3 screws in triangular configuration or fixing a LEFT femoral neck fracture, which shows no signs of AVN or displacement. Healing is progressing normally.  Impression healing LEFT femoral neck fracture with internal fixation

## 2011-08-24 ENCOUNTER — Encounter: Payer: Self-pay | Admitting: Orthopedic Surgery

## 2011-08-24 ENCOUNTER — Ambulatory Visit (INDEPENDENT_AMBULATORY_CARE_PROVIDER_SITE_OTHER): Payer: Medicare HMO | Admitting: Orthopedic Surgery

## 2011-08-24 DIAGNOSIS — S72009A Fracture of unspecified part of neck of unspecified femur, initial encounter for closed fracture: Secondary | ICD-10-CM

## 2011-08-24 NOTE — Patient Instructions (Signed)
Activity as tolerated

## 2011-08-24 NOTE — Progress Notes (Signed)
Patient ID: Holly Sloan, female   DOB: 10-19-1928, 78 y.o.   MRN: [id number] This is a fracture care followup visit  LEFT hip fracture.  LEFT hip cannulated screw fixation for femoral neck fracture.  Date of procedure October 9   Diagnoses left hip fracture   Treatment otif with cannulated screws   Current medications as recorded   Today we will be taking x-rays.  Review of systems occasional swelling of the LEFT hip incision  The x-rays show the fracture to be healed with no Carbocaine features of the implants  Patient discharge follow up as needed  Separate x-ray report AP lateral LEFT hip  LEFT hip fracture  3 screws with one washer across LEFT femoral neck fracture which is anatomically aligned and the fracture line not visible  Impression healed LEFT hip fracture with internal fixation

## 2013-10-14 ENCOUNTER — Other Ambulatory Visit (HOSPITAL_COMMUNITY): Payer: Self-pay | Admitting: Internal Medicine

## 2013-10-14 DIAGNOSIS — I82409 Acute embolism and thrombosis of unspecified deep veins of unspecified lower extremity: Secondary | ICD-10-CM

## 2013-10-14 DIAGNOSIS — R609 Edema, unspecified: Secondary | ICD-10-CM

## 2013-10-14 HISTORY — DX: Acute embolism and thrombosis of unspecified deep veins of unspecified lower extremity: I82.409

## 2013-10-15 ENCOUNTER — Ambulatory Visit (HOSPITAL_COMMUNITY)
Admission: RE | Admit: 2013-10-15 | Discharge: 2013-10-15 | Disposition: A | Discharge status: Home / Self Care | Payer: Medicare HMO | Source: Ambulatory Visit | Attending: Internal Medicine | Admitting: Internal Medicine

## 2013-10-15 DIAGNOSIS — I82409 Acute embolism and thrombosis of unspecified deep veins of unspecified lower extremity: Secondary | ICD-10-CM | POA: Insufficient documentation

## 2013-10-15 DIAGNOSIS — R609 Edema, unspecified: Secondary | ICD-10-CM

## 2013-10-18 ENCOUNTER — Encounter (HOSPITAL_COMMUNITY): Payer: Self-pay | Admitting: Emergency Medicine

## 2013-10-18 ENCOUNTER — Emergency Department (HOSPITAL_COMMUNITY): Payer: Medicare HMO

## 2013-10-18 ENCOUNTER — Emergency Department (HOSPITAL_COMMUNITY)
Admission: EM | Admit: 2013-10-18 | Discharge: 2013-10-18 | Disposition: A | Discharge status: Home / Self Care | Payer: Medicare HMO | Attending: Emergency Medicine | Admitting: Emergency Medicine

## 2013-10-18 DIAGNOSIS — Z79899 Other long term (current) drug therapy: Secondary | ICD-10-CM | POA: Insufficient documentation

## 2013-10-18 DIAGNOSIS — I10 Essential (primary) hypertension: Secondary | ICD-10-CM | POA: Insufficient documentation

## 2013-10-18 DIAGNOSIS — S52599A Other fractures of lower end of unspecified radius, initial encounter for closed fracture: Secondary | ICD-10-CM | POA: Insufficient documentation

## 2013-10-18 DIAGNOSIS — Z87442 Personal history of urinary calculi: Secondary | ICD-10-CM | POA: Insufficient documentation

## 2013-10-18 DIAGNOSIS — Z7901 Long term (current) use of anticoagulants: Secondary | ICD-10-CM | POA: Insufficient documentation

## 2013-10-18 DIAGNOSIS — Z86718 Personal history of other venous thrombosis and embolism: Secondary | ICD-10-CM | POA: Insufficient documentation

## 2013-10-18 DIAGNOSIS — Z8701 Personal history of pneumonia (recurrent): Secondary | ICD-10-CM | POA: Insufficient documentation

## 2013-10-18 DIAGNOSIS — R296 Repeated falls: Secondary | ICD-10-CM | POA: Insufficient documentation

## 2013-10-18 DIAGNOSIS — Y9389 Activity, other specified: Secondary | ICD-10-CM | POA: Insufficient documentation

## 2013-10-18 DIAGNOSIS — I509 Heart failure, unspecified: Secondary | ICD-10-CM | POA: Insufficient documentation

## 2013-10-18 DIAGNOSIS — Z8739 Personal history of other diseases of the musculoskeletal system and connective tissue: Secondary | ICD-10-CM | POA: Insufficient documentation

## 2013-10-18 DIAGNOSIS — Y929 Unspecified place or not applicable: Secondary | ICD-10-CM | POA: Insufficient documentation

## 2013-10-18 DIAGNOSIS — S52509A Unspecified fracture of the lower end of unspecified radius, initial encounter for closed fracture: Secondary | ICD-10-CM

## 2013-10-18 DIAGNOSIS — S62109A Fracture of unspecified carpal bone, unspecified wrist, initial encounter for closed fracture: Secondary | ICD-10-CM

## 2013-10-18 HISTORY — DX: Acute embolism and thrombosis of unspecified deep veins of unspecified lower extremity: I82.409

## 2013-10-18 HISTORY — DX: Fracture of unspecified carpal bone, unspecified wrist, initial encounter for closed fracture: S62.109A

## 2013-10-18 MED ORDER — TRAMADOL HCL 50 MG PO TABS: 50.0000 mg | ORAL_TABLET | Freq: Four times a day (QID) | ORAL | Status: DC | PRN

## 2013-10-18 MED ORDER — HYDROCODONE-ACETAMINOPHEN 5-325 MG PO TABS
1.0000 | ORAL_TABLET | Freq: Once | ORAL | Status: AC
  Administered 2013-10-18: 1 via ORAL
  Filled 2013-10-18: qty 1

## 2013-10-18 NOTE — ED Notes (Signed)
He knees gave out and she went down on her right wrist and now it is swollen. Was diagnosed with a blood clot one week ago per EMS.

## 2013-10-18 NOTE — Discharge Instructions (Signed)
Please wear the splint until the followup with Dr. Aline Brochure. Call the office on Monday morning, you should be seen early this week  Please call your doctor for a followup appointment within 24-48 hours. When you talk to your doctor please let them know that you were seen in the emergency department and have them acquire all of your records so that they can discuss the findings with you and formulate a treatment plan to fully care for your new and ongoing problems.

## 2013-10-18 NOTE — ED Provider Notes (Signed)
CSN: [id number]     Arrival date & time 10/18/13  0021 History   This chart was scribed for Holly Acosta, MD by Era Bumpers, ED scribe. This patient was seen in room APA04/APA04 and the patient's care was started at [facility].  Chief Complaint  Patient presents with  . Fall   The history is provided by the patient and the EMS personnel. No language interpreter was used.   HPI Comments: Holly Sloan is a 80 y.o. female who presents to the Emergency Department by EMS complaining of a fall this AM because her legs gave out, falling to her right wrist and injuring her right wrist. She was recently dx w/DVT of her left leg this week and started on oral anticoagulant. We are unsure of the name of this medication, she did not bring medications with her. She also presents with obvious deformity and swelling of her right wrist. She rates her right wrist as 5/10 in pain, constant, worse with range of motion, associated with swelling of the hand wrist forearm up to the elbow. She denies striking her head and has been ambulatory after the fall. She does walk with a walker at baseline    Past Medical History  Diagnosis Date  . CHF (congestive heart failure)   . Hypertension   . Arthritis   . Kidney stone   . Pneumonia     20 years ago  . DVT (deep venous thrombosis)    Past Surgical History  Procedure Laterality Date  . Appendectomy      as teenager  . Kidney stone surgery  jan 2012    stone remopved left side  . Hip pinning,cannulated  05/23/2011    Procedure: CANNULATED HIP PINNING;  Surgeon: Arther Abbott, MD;  Location: [facility];  Service: Orthopedics;  Laterality: Left;  late entry: updated foley documentation   Family History  Problem Relation Age of Onset  . Anesthesia problems Neg Hx   . Hypotension Neg Hx   . Malignant hyperthermia Neg Hx   . Pseudochol deficiency Neg Hx    History  Substance Use Topics  . Smoking status: Never Smoker   . Smokeless tobacco: Not on file  . Alcohol  Use: No   OB History   Grav Para Term Preterm Abortions TAB SAB Ect Mult Living                 Review of Systems  All other systems reviewed and are negative.    Allergies  Review of patient's allergies indicates no known allergies.  Home Medications   Current Outpatient Rx  Name  Route  Sig  Dispense  Refill  . amLODipine (NORVASC) 2.5 MG tablet   Oral   Take 2.5 mg by mouth at bedtime.           . furosemide (LASIX) 40 MG tablet   Oral   Take 40 mg by mouth daily.           . potassium chloride (KLOR-CON) 10 MEQ CR tablet   Oral   Take 10 mEq by mouth daily.            There were no vitals taken for this visit. Physical Exam  Nursing note and vitals reviewed. Constitutional: She is oriented to person, place, and time. She appears well-developed and well-nourished. No distress.  HENT:  Head: Normocephalic and atraumatic.  Eyes: Conjunctivae are normal. Right eye exhibits no discharge. Left eye exhibits no discharge.  Neck: Normal  range of motion.  Cardiovascular: Normal rate.   No murmur heard. Pulmonary/Chest: Effort normal. No respiratory distress.  Abdominal: Soft. There is no tenderness.  Musculoskeletal: She exhibits edema ( Bilateral lower extremity edema which appears symmetrical and is pending) and tenderness.  Decreased range of motion at the right wrist, there is associated swelling, bruising and deformity of the right wrist. She is able to grip with her right hand with minimal pain. Normal range of motion of the right elbow  Neurological: She is alert and oriented to person, place, and time.  Normal sensation to the right upper extremity  Skin: Skin is warm and dry.  Bruising of the mid right forearm through the hand  Psychiatric: She has a normal mood and affect. Thought content normal.    ED Course  SPLINT APPLICATION Date/Time: 09/16/7626 3:53 AM Performed by: Noemi Chapel D Authorized by: Noemi Chapel D Consent: Verbal consent  obtained. Risks and benefits: risks, benefits and alternatives were discussed Consent given by: patient Required items: required blood products, implants, devices, and special equipment available Patient identity confirmed: verbally with patient Time out: Immediately prior to procedure a "time out" was called to verify the correct patient, procedure, equipment, support staff and site/side marked as required. Location details: right arm Splint type: sugar tong Supplies used: cotton padding and Ortho-Glass Post-procedure: The splinted body part was neurovascularly unchanged following the procedure. Patient tolerance: Patient tolerated the procedure well with no immediate complications.   (including critical care time) Labs Review Labs Reviewed - No data to display Imaging Review No results found.   EKG Interpretation None      MDM   Final diagnoses:  None    The patient is a fall with a likely subsequent fracture of the right upper extremity. This appears to be consistent with a Colles' fracture, imaging pending, ice elevation and pain medications have been ordered. She does not appear to have any other injuries, she is on oral anticoagulants, unsure of the exact medication  I personally placed a splint, the patient tolerated the procedure well without any difficulty, stable for discharge to followup with Dr. Aline Brochure with whom she has seen in the past and who she wants to followup with.  Meds given in ED:  Medications  HYDROcodone-acetaminophen (NORCO/VICODIN) 5-325 MG per tablet 1 tablet (1 tablet Oral Given 10/18/13 0329)    New Prescriptions   TRAMADOL (ULTRAM) 50 MG TABLET    Take 1 tablet (50 mg total) by mouth every 6 (six) hours as needed.      Holly Acosta, MD 10/18/13 [phone]

## 2013-10-20 ENCOUNTER — Telehealth: Payer: Self-pay | Admitting: Orthopedic Surgery

## 2013-10-20 NOTE — Telephone Encounter (Signed)
Patient's daughter, Anette Guarneri, cell [phone], called to request appointment for her mom, follow up from [facility] Emergency Room 10/18/13.  States she has previously seen Dr Aline Brochure therefore requested him.  Discussed the new [employer], which we reviewed with patient's primary care, Dr Merlyn Albert.  Appointment has been scheduled for tomorrow, 10/21/13.  Patient aware.

## 2013-10-21 ENCOUNTER — Encounter: Payer: Self-pay | Admitting: Orthopedic Surgery

## 2013-10-21 ENCOUNTER — Ambulatory Visit (INDEPENDENT_AMBULATORY_CARE_PROVIDER_SITE_OTHER): Payer: Medicare HMO | Admitting: Orthopedic Surgery

## 2013-10-21 VITALS — BP 103/54 | Ht 65.0 in | Wt 165.0 lb

## 2013-10-21 DIAGNOSIS — S52539A Colles' fracture of unspecified radius, initial encounter for closed fracture: Secondary | ICD-10-CM

## 2013-10-21 DIAGNOSIS — S52531A Colles' fracture of right radius, initial encounter for closed fracture: Secondary | ICD-10-CM

## 2013-10-21 NOTE — Patient Instructions (Signed)
Keep cast dry 

## 2013-10-21 NOTE — Progress Notes (Signed)
Patient ID: Holly Sloan, female   DOB: May 16, 1929, 80 y.o.   MRN: [id number]  New problem Chief Complaint  Patient presents with  . Wrist Pain    [facility] follow up right wrist fracture DOI 10/17/13   HISTORY: This is a an 80 year old female with a newly diagnosed blood clot in the left lower leg who presents with a slightly displaced slightly angulated right distal radius fracture sustained on 10/17/2013 secondary to a fall. She complains of mild nonradiating discomfort. Over the right wrist with associated pain swelling and ecchymosis.  Review of systems recorded reviewed and scanned in   Vital signs: BP 103/54  Ht 5\' 5"  (1.651 m)  Wt 165 lb (74.844 kg)  BMI 27.46 kg/m2  General the patient is well-developed and well-nourished grooming and hygiene are normal Oriented x3 Mood and affect normal Ambulation normal  Inspection of the right wrist tenderness slight deformity decreased range of motion normal motor exam skin ecchymotic  Cardiovascular exam is normal Sensory exam normal  The x-rays do show angulation and shortening ulnar deviation minimal displacement of the distal radius.  Recommend short arm cast secondary to recent onset of blood clot  If the patient is having difficulty after the blood clot has been treated then she can always have an osteotomy and plating. Patient and family made aware. They agree to treatment plan. X-ray in cast in 4 weeks

## 2013-10-24 ENCOUNTER — Emergency Department (HOSPITAL_COMMUNITY): Payer: Medicare HMO

## 2013-10-24 ENCOUNTER — Emergency Department (HOSPITAL_COMMUNITY)
Admission: EM | Admit: 2013-10-24 | Discharge: 2013-10-24 | Disposition: A | Discharge status: Home / Self Care | Payer: Medicare HMO | Attending: Emergency Medicine | Admitting: Emergency Medicine

## 2013-10-24 ENCOUNTER — Encounter (HOSPITAL_COMMUNITY): Payer: Medicare HMO | Attending: Hematology and Oncology

## 2013-10-24 ENCOUNTER — Encounter (HOSPITAL_COMMUNITY): Payer: Self-pay

## 2013-10-24 ENCOUNTER — Encounter (HOSPITAL_COMMUNITY): Payer: Self-pay | Admitting: Emergency Medicine

## 2013-10-24 VITALS — Wt 165.0 lb

## 2013-10-24 DIAGNOSIS — Z87442 Personal history of urinary calculi: Secondary | ICD-10-CM | POA: Insufficient documentation

## 2013-10-24 DIAGNOSIS — Z86718 Personal history of other venous thrombosis and embolism: Secondary | ICD-10-CM | POA: Insufficient documentation

## 2013-10-24 DIAGNOSIS — Z8739 Personal history of other diseases of the musculoskeletal system and connective tissue: Secondary | ICD-10-CM | POA: Insufficient documentation

## 2013-10-24 DIAGNOSIS — Z8781 Personal history of (healed) traumatic fracture: Secondary | ICD-10-CM | POA: Insufficient documentation

## 2013-10-24 DIAGNOSIS — I509 Heart failure, unspecified: Secondary | ICD-10-CM | POA: Insufficient documentation

## 2013-10-24 DIAGNOSIS — Z8701 Personal history of pneumonia (recurrent): Secondary | ICD-10-CM | POA: Insufficient documentation

## 2013-10-24 DIAGNOSIS — G8911 Acute pain due to trauma: Secondary | ICD-10-CM | POA: Insufficient documentation

## 2013-10-24 DIAGNOSIS — E8809 Other disorders of plasma-protein metabolism, not elsewhere classified: Secondary | ICD-10-CM

## 2013-10-24 DIAGNOSIS — Z79899 Other long term (current) drug therapy: Secondary | ICD-10-CM | POA: Insufficient documentation

## 2013-10-24 DIAGNOSIS — R0602 Shortness of breath: Secondary | ICD-10-CM | POA: Insufficient documentation

## 2013-10-24 DIAGNOSIS — M79609 Pain in unspecified limb: Secondary | ICD-10-CM | POA: Insufficient documentation

## 2013-10-24 DIAGNOSIS — S62101A Fracture of unspecified carpal bone, right wrist, initial encounter for closed fracture: Secondary | ICD-10-CM

## 2013-10-24 DIAGNOSIS — I82409 Acute embolism and thrombosis of unspecified deep veins of unspecified lower extremity: Secondary | ICD-10-CM

## 2013-10-24 DIAGNOSIS — Z7901 Long term (current) use of anticoagulants: Secondary | ICD-10-CM | POA: Insufficient documentation

## 2013-10-24 DIAGNOSIS — S5292XA Unspecified fracture of left forearm, initial encounter for closed fracture: Secondary | ICD-10-CM

## 2013-10-24 DIAGNOSIS — I10 Essential (primary) hypertension: Secondary | ICD-10-CM | POA: Insufficient documentation

## 2013-10-24 DIAGNOSIS — E039 Hypothyroidism, unspecified: Secondary | ICD-10-CM | POA: Insufficient documentation

## 2013-10-24 LAB — COMPREHENSIVE METABOLIC PANEL
ALK PHOS: 125 U/L — AB (ref 39–117)
ALT: 16 U/L (ref 0–35)
AST: 19 U/L (ref 0–37)
Albumin: 1.8 g/dL — ABNORMAL LOW (ref 3.5–5.2)
BILIRUBIN TOTAL: 0.4 mg/dL (ref 0.3–1.2)
BUN: 38 mg/dL — AB (ref 6–23)
CO2: 27 meq/L (ref 19–32)
Calcium: 9.5 mg/dL (ref 8.4–10.5)
Chloride: 105 mEq/L (ref 96–112)
Creatinine, Ser: 0.93 mg/dL (ref 0.50–1.10)
GFR, EST AFRICAN AMERICAN: 65 mL/min — AB (ref >90)
GFR, EST NON AFRICAN AMERICAN: 56 mL/min — AB (ref >90)
GLUCOSE: 107 mg/dL — AB (ref 70–99)
POTASSIUM: 3.9 meq/L (ref 3.7–5.3)
Sodium: 140 mEq/L (ref 137–147)
TOTAL PROTEIN: 4.4 g/dL — AB (ref 6.0–8.3)

## 2013-10-24 LAB — RETICULOCYTES
RBC.: 3.31 MIL/uL — ABNORMAL LOW (ref 3.87–5.11)
RETIC COUNT ABSOLUTE: 149 10*3/uL (ref 19.0–186.0)
Retic Ct Pct: 4.5 % — ABNORMAL HIGH (ref 0.4–3.1)

## 2013-10-24 LAB — CBC WITH DIFFERENTIAL/PLATELET
Basophils Absolute: 0 10*3/uL (ref 0.0–0.1)
Basophils Relative: 0 % (ref 0–1)
EOS ABS: 0.1 10*3/uL (ref 0.0–0.7)
Eosinophils Relative: 1 % (ref 0–5)
HCT: 29.3 % — ABNORMAL LOW (ref 36.0–46.0)
Hemoglobin: 9.9 g/dL — ABNORMAL LOW (ref 12.0–15.0)
LYMPHS ABS: 1.8 10*3/uL (ref 0.7–4.0)
Lymphocytes Relative: 20 % (ref 12–46)
MCH: 29.9 pg (ref 26.0–34.0)
MCHC: 33.8 g/dL (ref 30.0–36.0)
MCV: 88.5 fL (ref 78.0–100.0)
MONOS PCT: 9 % (ref 3–12)
Monocytes Absolute: 0.8 10*3/uL (ref 0.1–1.0)
Neutro Abs: 6.2 10*3/uL (ref 1.7–7.7)
Neutrophils Relative %: 70 % (ref 43–77)
Platelets: 382 10*3/uL (ref 150–400)
RBC: 3.31 MIL/uL — AB (ref 3.87–5.11)
RDW: 15.4 % (ref 11.5–15.5)
WBC: 8.9 10*3/uL (ref 4.0–10.5)

## 2013-10-24 LAB — LACTATE DEHYDROGENASE: LDH: 241 U/L (ref 94–250)

## 2013-10-24 NOTE — Patient Instructions (Signed)
[facility] Discharge Instructions  RECOMMENDATIONS MADE BY THE CONSULTANT AND ANY TEST RESULTS WILL BE SENT TO YOUR REFERRING PHYSICIAN.  We will see you in 3 weeks. We will do labs at that time.  Thank you for choosing [facility] to provide your oncology and hematology care.  To afford each patient quality time with our providers, please arrive at least 15 minutes before your scheduled appointment time.  With your help, our goal is to use those 15 minutes to complete the necessary work-up to ensure our physicians have the information they need to help with your evaluation and healthcare recommendations.    Effective January 1st, 2014, we ask that you re-schedule your appointment with our physicians should you arrive 10 or more minutes late for your appointment.  We strive to give you quality time with our providers, and arriving late affects you and other patients whose appointments are after yours.    Again, thank you for choosing [facility].  Our hope is that these requests will decrease the amount of time that you wait before being seen by our physicians.       _____________________________________________________________  Should you have questions after your visit to [facility], please contact our office at [phone] between the hours of 8:30 a.m. and 5:00 p.m.  Voicemails left after 4:30 p.m. will not be returned until the following business day.  For prescription refill requests, have your pharmacy contact our office with your prescription refill request.

## 2013-10-24 NOTE — ED Notes (Signed)
Pt placed in cast on Tues by Dr. Aline Brochure, yesterday noticed hand cold, bluish in color. Pt sent by Cancer center for same. Rt hand is cold to touch and blue discoloration, 5/10 pain. Pt is Xarelto for DVT in Lt leg.

## 2013-10-24 NOTE — ED Provider Notes (Addendum)
CSN: [id number]     Arrival date & time 10/24/13  1512 History  This chart was scribed for Holly Essex, MD by Jenne Campus, ED Scribe. This patient was seen in room APA03/APA03 and the patient's care was started at 3:22 PM.    Chief Complaint  Patient presents with  . Extremity Pain    Rt arm      The history is provided by the patient. No language interpreter was used.    HPI Comments: Holly Sloan is a 80 y.o. female who presents to the Emergency Department complaining of intermittent right hand discoloration with decreased warmth since a cast placement one week ago. Pt broke her wrist one week ago. She was seen in the ED the next day for the same sxs. The cast was re-done and she was told to f/u with Dr. Aline Brochure. She was re-evaluated in his office 4 days ago and had another cast redone after a more severe form of the sxs started. The sxs resolved, but family states that the pt's right hand began to turn purple yesterday. The normal color and warmth returned with use. Family became concerned when the hand again began to turn purple again today. She reports SOB with changing positions and exertion; however, family states that this is ongoing. She is currently on Xarelto due to a left leg DVT.   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   Past Medical History  Diagnosis Date  . CHF (congestive heart failure)   . Hypertension   . Arthritis   . Kidney stone   . Pneumonia     20 years ago  . DVT (deep venous thrombosis) 10/14/13    left leg  . Fracture of wrist 10/18/13    right  . Hip fracture, left    Past Surgical History  Procedure Laterality Date  . Appendectomy      as teenager  . Kidney stone surgery  jan 2012    stone remopved left side  . Hip pinning,cannulated  05/23/2011    Procedure: CANNULATED HIP PINNING;  Surgeon: Arther Abbott, MD;  Location: [facility];  Service: Orthopedics;  Laterality: Left;  late entry: updated foley documentation    Family History  Problem Relation Age of Onset  . Anesthesia problems Neg Hx   . Hypotension Neg Hx   . Malignant hyperthermia Neg Hx   . Pseudochol deficiency Neg Hx    History  Substance Use Topics  . Smoking status: Never Smoker   . Smokeless tobacco: Never Used  . Alcohol Use: No   No OB history provided.  Review of Systems  A complete 10 system review of systems was obtained and all systems are negative except as noted in the HPI and PMH.    Allergies  Review of patient's allergies indicates no known allergies.  Home Medications   Current Outpatient Rx  Name  Route  Sig  Dispense  Refill  . Amino Acids-Protein Hydrolys (FEEDING SUPPLEMENT, PRO-STAT SUGAR FREE 64,) LIQD   Oral   Take 30 mLs by mouth 4 (four) times daily.         Marland Kitchen amLODipine (NORVASC) 2.5 MG tablet   Oral   Take 2.5 mg by mouth daily.          . furosemide (LASIX) 40 MG tablet   Oral   Take 40-80 mg by mouth 2 (two) times daily. Two tablets in the morning and one tablet in the evening For swelling         .  gabapentin (NEURONTIN) 300 MG capsule   Oral   Take 300 mg by mouth 3 (three) times daily.         Marland Kitchen levothyroxine (SYNTHROID, LEVOTHROID) 25 MCG tablet   Oral   Take 25 mcg by mouth every morning.          . Rivaroxaban (XARELTO) 15 MG TABS tablet   Oral   Take 15 mg by mouth 2 (two) times daily with a meal.         . spironolactone (ALDACTONE) 25 MG tablet   Oral   Take 25 mg by mouth daily.         . traMADol (ULTRAM) 50 MG tablet   Oral   Take 1 tablet (50 mg total) by mouth every 6 (six) hours as needed.   15 tablet   0    Triage Vitals: BP 129/64  Pulse 74  Temp(Src) 97.3 F (36.3 C) (Oral)  SpO2 99%  Physical Exam  Nursing note and vitals reviewed. Constitutional: She is oriented to person, place, and time. She appears well-developed and well-nourished. No distress.  HENT:  Head: Normocephalic and atraumatic.  Eyes: EOM are normal.  Neck: Neck  supple. No tracheal deviation present.  Cardiovascular: Normal rate and regular rhythm.   Pulmonary/Chest: Effort normal and breath sounds normal. No respiratory distress.  Musculoskeletal: She exhibits tenderness.  Right hand is cool, ecchymotic and bruised. FROM of fingers. FROM of right elbow Extensive bruising to right palm and ulnar forearm after cast removal.  Neurological: She is alert and oriented to person, place, and time.  Skin: Skin is warm and dry.  Bandaged skin tear to left upper arm  Psychiatric: She has a normal mood and affect. Her behavior is normal.    ED Course  Procedures (including critical care time)  DIAGNOSTIC STUDIES: Oxygen Saturation is 99% on RA, normal by my interpretation.    COORDINATION OF CARE: 3:29 PM-Discussed treatment plan which includes consult with Dr. Aline Brochure and cast removal with pt at bedside and pt agreed to plan.   3:31 PM-Consult complete with Dr. Aline Brochure, Ortho. Patient case explained and discussed. He agrees with pt tx plan. Call ended at 3:35 PM.   Labs Review Labs Reviewed - No data to display Imaging Review Dg Forearm Right  10/24/2013   CLINICAL DATA:  Recently diagnosed wrist fracture, right upper extremity pain and swelling  EXAM: RIGHT FOREARM - 2 VIEW  COMPARISON:  DG HUMERUS*R* dated 10/24/2013; DG WRIST COMPLETE*R* dated 10/18/2013  FINDINGS: Comminuted distal right radial fracture is reidentified. Mild apex volar angulation of the fracture fragments is noted. There is approximately 1/2 shaft width fracture fragment overlap. No significant callus formation. No radiopaque foreign body or soft tissue abnormality. No other forearm fracture is identified.  IMPRESSION: Distal right radial fracture reidentified.   Electronically Signed   By: Conchita Paris M.D.   On: 10/24/2013 17:58   US Venous Img Upper Uni Right  10/24/2013   CLINICAL DATA:  Right arm swelling and pain  EXAM: Right UPPER EXTREMITY VENOUS DOPPLER ULTRASOUND   TECHNIQUE: Gray-scale sonography with graded compression, as well as color Doppler and duplex ultrasound were performed to evaluate the upper extremity deep venous system from the level of the subclavian vein and including the jugular, axillary, basilic and upper cephalic vein. Spectral Doppler was utilized to evaluate flow at rest and with distal augmentation maneuvers.  COMPARISON:  None.  FINDINGS: Thrombus within deep veins:  None visualized.  Compressibility of deep  veins:  Normal.  Duplex waveform respiratory phasicity:  Normal.  Duplex waveform response to augmentation:  Normal.  Venous reflux:  None visualized.  Other findings:  None visualized.  IMPRESSION: No evidence of right upper extremity deep venous thrombosis is noted. The area of clinical concern is likely related to the patient's known distal radial fracture.   Electronically Signed   By: Inez Catalina M.D.   On: 10/24/2013 17:05   US Arterial Seg Single  10/24/2013   CLINICAL DATA:  Right arm discoloration  EXAM: NONINVASIVE PHYSIOLOGIC VASCULAR STUDY OF BILATERAL upper EXTREMITIES  TECHNIQUE: Evaluation of both lower extremities were performed at rest, including calculation of ankle-brachial indices with single level Doppler, pressure and pulse volume recording.  COMPARISON:  None.  FINDINGS: Right wrist brachial index:  1.06  Left wrist brachial index:  1.08  Right Lower Extremity: Multiphasic waveforms are noted although slightly dampened when compared with the left.  Left Lower Extremity:  Normal multiphasic waveforms.  IMPRESSION: Slight dampening of the waveforms in the right extremity although no definitive arterial abnormality is noted. The pain and swelling in the right hand and wrists is likely related to the patient's known underlying wrist fracture.   Electronically Signed   By: Inez Catalina M.D.   On: 10/24/2013 17:07   Dg Humerus Right  10/24/2013   CLINICAL DATA:  Right upper extremity pain, recent wrist fracture with  immobilization in a cast  EXAM: RIGHT HUMERUS - 2+ VIEW  COMPARISON:  DG HAND COMPLETE*R* dated 10/24/2013; DG WRIST COMPLETE*R* dated 10/18/2013  FINDINGS: There is no evidence of fracture or other focal bone lesions. Soft tissues are unremarkable.  IMPRESSION: Negative.   Electronically Signed   By: Conchita Paris M.D.   On: 10/24/2013 17:56   Dg Hand Complete Right  10/24/2013   CLINICAL DATA:  Right hand pain, history of known fracture  EXAM: RIGHT HAND - COMPLETE 3+ VIEW  COMPARISON:  None.  FINDINGS: There is a comminuted distal right radial fracture with impaction and posterior angulation at the fracture site. The degree of angulation has increased in the interval from the prior exam as has the degree of impaction at the fracture site. Mild ulnar styloid avulsion is noted. No new fracture is noted.  IMPRESSION: Distal radial and ulnar fractures with slight increase in impaction and increase in the degree of posterior angulation at the fracture site.   Electronically Signed   By: Inez Catalina M.D.   On: 10/24/2013 17:48     EKG Interpretation None      MDM   Final diagnoses:  Fracture of right wrist  R hand bruising, swelling, ecchymosis, cast placed 2 days ago by Dr. Aline Brochure.  No weakness, numbness, tingling.  Hand cool, moving all fingers.  Discussed with Dr. Aline Brochure. He states patient had some evidence of bleeding under the skin probably from her xarelto. He agrees with cast removal to ensure patient has distal pulses.  +2vradial pulse after cast removal. R wrist deformity closed.  Extensive bruising of palm and volar forearm.  No evidence of upper extremity DVT. Triphasic wave form seen. No evidence of arterial compromise.  Suspect patient's ecchymosis and bruising secondary to xarelto use. No evidence of acute limb ischemia.  Fractures appear stable on x-ray. Patient has intact radial pulse. She is splinted again. She will followup with Dr. Aline Brochure next week. Lab studies  ordered by Dr. Barnet Glasgow of oncology and results pending at time of discharge from ED.  BP 114/61  Pulse 79  Temp(Src) 97.3 F (36.3 C) (Oral)  Resp 18  SpO2 98%   I personally performed the services described in this documentation, which was scribed in my presence. The recorded information has been reviewed and is accurate.      Holly Essex, MD 10/24/13 Bells, MD 10/25/13 [phone]

## 2013-10-24 NOTE — Progress Notes (Signed)
[place] A. Barnet Glasgow, M.D.  NEW PATIENT EVALUATION   Name: Holly Sloan Date: 10/24/2013 MRN: [id number] DOB: 1928/08/20  PCP: Delphina Cahill, MD   REFERRING PHYSICIAN: Delphina Cahill, MD  REASON FOR REFERRAL: Low albumin.     HISTORY OF PRESENT ILLNESS:Holly Sloan is a 80 y.o. female who is referred by her family physician because of persistently low albumin with recent diagnosis of deep venous thrombosis of the left lower extremity that was nonocclusive along with a fracture of the distal radius and ulna. She had a cast applied to the right upper extremity and her hands turning blue it is cold. She lives with her daughter who is feeding her excellent diet. She denies any nausea or vomiting his only had loose bowel movements for the last 2 or 3 days. She denies abdominal distention, melena, hematochezia, hematuria, incontinence, vaginal bleeding, hematuria, cough, wheezing, sore throat, worsening bone pain, skin rash, headache, or seizures. She denies easy satiety, polyuria, polydipsia, nocturia, but does have chronic lower extremity swelling with increased discomfort in the left lower extremity recently diagnosed as a deep venous thrombosis on 10/14/2013. There is a history recently diagnosed hypothyroidism for which she is taking Synthroid 25 mcg daily along with a large lipoma on the posterior thorax. She underwent an excellent workup by her internist without a clear reason to have low albumin and she is referred here today for further intervention and possible reversal.   PAST MEDICAL HISTORY:  has a past medical history of CHF (congestive heart failure); Hypertension; Arthritis; Kidney stone; Pneumonia; and DVT (deep venous thrombosis).     PAST SURGICAL HISTORY: Past Surgical History  Procedure Laterality Date  . Appendectomy      as teenager  . Kidney stone surgery  jan 2012    stone remopved left side  . Hip  pinning,cannulated  05/23/2011    Procedure: CANNULATED HIP PINNING;  Surgeon: Arther Abbott, MD;  Location: [facility];  Service: Orthopedics;  Laterality: Left;  late entry: updated foley documentation     CURRENT MEDICATIONS: has a current medication list which includes the following prescription(s): feeding supplement (pro-stat sugar free 64), amlodipine, furosemide, gabapentin, potassium chloride, rivaroxaban, spironolactone, and tramadol.   ALLERGIES: Review of patient's allergies indicates no known allergies.   SOCIAL HISTORY:  reports that she has never smoked. She does not have any smokeless tobacco history on file. She reports that she does not drink alcohol or use illicit drugs.   FAMILY HISTORY: family history is negative for Anesthesia problems, Hypotension, Malignant hyperthermia, and Pseudochol deficiency.    REVIEW OF SYSTEMS:  Other than that discussed above is noncontributory.    PHYSICAL EXAM:  vitals were not taken for this visit.   GENERAL:alert, no distress and comfortable SKIN: skin color, texture, turgor are normal, no rashes or significant lesions EYES: normal, Conjunctiva are pink and non-injected, sclera clear OROPHARYNX:no exudate, no erythema and lips, buccal mucosa, and tongue normal  NECK: supple, thyroid normal size, non-tender, without nodularity CHEST: Kyphosis scoliosis with lipoma on the posterior thorax. LYMPH:  no palpable lymphadenopathy in the cervical, axillary or inguinal LUNGS: clear to auscultation and percussion with normal breathing effort HEART: regular rate & rhythm and no murmurs ABDOMEN:abdomen soft, non-tender and normal bowel sounds MUSCULOSKELETALl:no cyanosis of digits, no clubbing or edema . Left lower extremity swelling with a positive Homans sign. Right upper extremity was cast in place with bluish discoloration of  the entire hand with cold sensation. NEURO: alert & oriented x 3 with fluent speech, no focal motor/sensory  deficits    LABORATORY DATA:   08/28/2012: prealbumin of 19.4 with normal being 17-34, TSH of 9.8, ESR 1 with CRP less than 0.5 and a 24-hour urine protein of 114 with normal being up to 100. SPEP was normal with BUN 50 creatinine 0.8        '@RADIOGRAPHY'$ : Dg Elbow 2 Views Right  10/18/2013   CLINICAL DATA:  Fall yesterday.  Elbow pain.  EXAM: RIGHT ELBOW - 2 VIEW  COMPARISON:  None.  FINDINGS: No effusion or fracture. Normal joint alignment. Osteopenia. No focal bone lesion.  IMPRESSION: No acute osseous abnormality.   Electronically Signed   By: Jorje Guild M.D.   On: 10/18/2013 01:23   Dg Wrist Complete Right  10/18/2013   CLINICAL DATA:  Fall with pain  EXAM: RIGHT WRIST - COMPLETE 3+ VIEW  COMPARISON:  None.  FINDINGS: There is an acute transverse fracture through the distal radial metaphysis, with posterior impaction causing dorsal tilting of the wrist. Acquired ulnar positive variance. There is likely a fracture plane extending to the radiocarpal joint, without articular surface displacement. Distracted ulnar styloid process avulsion fracture.  Osteopenia.  First CMC osteoarthritis.  IMPRESSION: Displaced distal radius and ulna fractures, as above.   Electronically Signed   By: Jorje Guild M.D.   On: 10/18/2013 01:31   US Venous Img Lower Unilateral Left  10/15/2013   CLINICAL DATA:  Pain and swelling in the left lower extremity.  EXAM: LEFT LOWER EXTREMITY VENOUS DOPPLER ULTRASOUND  TECHNIQUE: Gray-scale sonography with graded compression, as well as color Doppler and duplex ultrasound, were performed to evaluate the deep venous system from the level of the common femoral vein through the popliteal and proximal calf veins. Spectral Doppler was utilized to evaluate flow at rest and with distal augmentation maneuvers.  COMPARISON:  05/29/2011.  FINDINGS: Thrombus within deep veins: Extensive nonocclusive thrombus visualized throughout the left lower extremity involving the left common  femoral vein, superficial femoral vein and popliteal vein.  Compressibility of deep veins: Incomplete compressibility in the areas of deep venous thrombosis.  Duplex waveform respiratory phasicity:  Reduced phasicity.  Duplex waveform response to augmentation:  Poor.  Other findings:  Edema in the subcutaneous fat of the left calf.  IMPRESSION: 1. Extensive nonocclusive deep venous thrombosis in the left lower extremity extending from the common femoral vein to the popliteal vein, as above.   Electronically Signed   By: Vinnie Langton M.D.   On: 10/15/2013 14:55    PATHOLOGY: Peripheral smear failed to reveal evidence of rouleaux formation.   IMPRESSION:  #1. Low albumin with myriad potential causes. From the history there is no clear-cut explanation other than perhaps malabsorption of diet because the patient is being fed a well-balanced diet by her daughter. There is no evidence of primary bone marrow disorder such as myeloma and there is not enough proteinuria to indict nephrotic syndrome. There is also no history of or physical findings a laboratory to suggest liver disease. #2. Deep venous thrombosis left lower extremity, currently on Xarelto. #3. Colles' fracture right upper extremity, status post casting with vascular insufficiency. #4. Hypothyroidism, on treatment. For repeat TSH today.    PLAN:  #1. Additional lab tests were done today including repeating a 24-hour urine for protein, TSH, prealbumin, LDH, kappa and lambda light chains, chem profile, and CBC. #2. Patient may need GI consultation for upper  endoscopy and possible duodenal biopsy. Celiac disease is unusual to be diagnosed in this age group and certainly it and inflammatory bowel disease are occluded and the differential diagnosis. #3. If nephrotic levels are proteinuria are discovered, consideration should be given to renal biopsy. I do not feel this will be found. #4. Patient will be referred to the emergency room for  removal of the cast and reapplication on the right upper extremity. #5. Followup in 3 weeks.  I appreciate the opportunity of sharing in her care.   Doroteo Bradford, MD 10/24/2013 1:48 PM

## 2013-10-24 NOTE — Discharge Instructions (Signed)
Hand Fracture Your fracture appears to be stable. There is no evidence of new blood clot or blood supply problem. Followup with Dr. Aline Brochure next week. Return to the ED develop worsening pain, weakness, numbness, tingling, skin color change or temperature change or any other concern. Your caregiver has diagnosed you with a fractured (broken) bone in your hand. If the bones are in good position and the hand is properly immobilized and rested, these injuries will usually heal in 3 to 6 weeks. A cast, splint, or bulky bandage is usually applied to keep the fracture site from moving. Do not remove the splint or cast until your caregiver approves. If the fracture is unstable or the bones are not aligned properly, surgery may be needed. Keep your hand raised (elevated) above the level of your heart as much as possible for the next 2 to 3 days until the swelling and pain are better. Apply ice packs for 15-20 minutes every 3 to 4 hours to help control the pain and swelling. See your caregiver or an orthopedic specialist as directed for follow-up care to make sure the fracture is beginning to heal properly. SEEK IMMEDIATE MEDICAL CARE IF:   You notice your fingers are cold, numb, crooked, or the pain of your injury is severe.  You are not improving or seem to be getting worse.  You have questions or concerns. Document Released: 09/07/2004 Document Revised: 10/23/2011 Document Reviewed: 01/26/2009 [facility] Patient Information 2014 [place].

## 2013-10-24 NOTE — Progress Notes (Signed)
The patient was taken in wheelchair to ed for evaluation of right hand/wrist pain that was casted Tuesday by dr.harrison. Report given to greg rn.

## 2013-10-24 NOTE — ED Notes (Signed)
Patient stable at present.  Good capillary refill on L hand, fingers warm.  Splinting has hardened.  Patient given opportunity to ask questions.  Voicing no complaints at present.  D/C'ed home w/family.

## 2013-10-25 ENCOUNTER — Encounter (HOSPITAL_COMMUNITY): Payer: Self-pay | Admitting: Emergency Medicine

## 2013-10-25 ENCOUNTER — Emergency Department (HOSPITAL_COMMUNITY)
Admission: EM | Admit: 2013-10-25 | Discharge: 2013-10-25 | Disposition: A | Discharge status: Home / Self Care | Payer: Medicare HMO | Attending: Emergency Medicine | Admitting: Emergency Medicine

## 2013-10-25 DIAGNOSIS — Z7901 Long term (current) use of anticoagulants: Secondary | ICD-10-CM | POA: Insufficient documentation

## 2013-10-25 DIAGNOSIS — I10 Essential (primary) hypertension: Secondary | ICD-10-CM | POA: Insufficient documentation

## 2013-10-25 DIAGNOSIS — D649 Anemia, unspecified: Secondary | ICD-10-CM | POA: Insufficient documentation

## 2013-10-25 DIAGNOSIS — I509 Heart failure, unspecified: Secondary | ICD-10-CM | POA: Insufficient documentation

## 2013-10-25 DIAGNOSIS — K625 Hemorrhage of anus and rectum: Secondary | ICD-10-CM

## 2013-10-25 DIAGNOSIS — Z8701 Personal history of pneumonia (recurrent): Secondary | ICD-10-CM | POA: Insufficient documentation

## 2013-10-25 DIAGNOSIS — Z86718 Personal history of other venous thrombosis and embolism: Secondary | ICD-10-CM | POA: Insufficient documentation

## 2013-10-25 DIAGNOSIS — M129 Arthropathy, unspecified: Secondary | ICD-10-CM | POA: Insufficient documentation

## 2013-10-25 DIAGNOSIS — K644 Residual hemorrhoidal skin tags: Secondary | ICD-10-CM | POA: Insufficient documentation

## 2013-10-25 DIAGNOSIS — Z79899 Other long term (current) drug therapy: Secondary | ICD-10-CM | POA: Insufficient documentation

## 2013-10-25 DIAGNOSIS — Z8781 Personal history of (healed) traumatic fracture: Secondary | ICD-10-CM | POA: Insufficient documentation

## 2013-10-25 DIAGNOSIS — Z87442 Personal history of urinary calculi: Secondary | ICD-10-CM | POA: Insufficient documentation

## 2013-10-25 DIAGNOSIS — R609 Edema, unspecified: Secondary | ICD-10-CM | POA: Insufficient documentation

## 2013-10-25 LAB — BASIC METABOLIC PANEL
BUN: 38 mg/dL — ABNORMAL HIGH (ref 6–23)
CHLORIDE: 105 meq/L (ref 96–112)
CO2: 28 meq/L (ref 19–32)
Calcium: 9.3 mg/dL (ref 8.4–10.5)
Creatinine, Ser: 1.01 mg/dL (ref 0.50–1.10)
GFR calc non Af Amer: 50 mL/min — ABNORMAL LOW (ref >90)
GFR, EST AFRICAN AMERICAN: 58 mL/min — AB (ref >90)
Glucose, Bld: 106 mg/dL — ABNORMAL HIGH (ref 70–99)
Potassium: 4.4 mEq/L (ref 3.7–5.3)
Sodium: 139 mEq/L (ref 137–147)

## 2013-10-25 LAB — CBC WITH DIFFERENTIAL/PLATELET
Basophils Absolute: 0.1 10*3/uL (ref 0.0–0.1)
Basophils Relative: 1 % (ref 0–1)
Eosinophils Absolute: 0.1 10*3/uL (ref 0.0–0.7)
Eosinophils Relative: 1 % (ref 0–5)
HCT: 29.4 % — ABNORMAL LOW (ref 36.0–46.0)
Hemoglobin: 9.9 g/dL — ABNORMAL LOW (ref 12.0–15.0)
LYMPHS ABS: 2 10*3/uL (ref 0.7–4.0)
Lymphocytes Relative: 27 % (ref 12–46)
MCH: 30.1 pg (ref 26.0–34.0)
MCHC: 33.7 g/dL (ref 30.0–36.0)
MCV: 89.4 fL (ref 78.0–100.0)
MONOS PCT: 11 % (ref 3–12)
Monocytes Absolute: 0.8 10*3/uL (ref 0.1–1.0)
NEUTROS ABS: 4.6 10*3/uL (ref 1.7–7.7)
NEUTROS PCT: 61 % (ref 43–77)
PLATELETS: 363 10*3/uL (ref 150–400)
RBC: 3.29 MIL/uL — AB (ref 3.87–5.11)
RDW: 15.9 % — ABNORMAL HIGH (ref 11.5–15.5)
WBC: 7.6 10*3/uL (ref 4.0–10.5)

## 2013-10-25 LAB — PROTIME-INR
INR: 1.45 (ref 0.00–1.49)
PROTHROMBIN TIME: 17.3 s — AB (ref 11.6–15.2)

## 2013-10-25 LAB — PREALBUMIN: Prealbumin: 17.2 mg/dL — ABNORMAL LOW (ref 17.0–34.0)

## 2013-10-25 NOTE — Discharge Instructions (Signed)
Anemia, Nonspecific Your hemoglobin today is the same as yesterday. Her bleeding is likely coming from hemorrhoids.  You need to have a followup with Dr. Nevada Crane or Dr. Laural Golden on Monday. Return to the ED if you develop worsening bleeding, dizziness, chest pain or any other concerns. Anemia is a condition in which the concentration of red blood cells or hemoglobin in the blood is below normal. Hemoglobin is a substance in red blood cells that carries oxygen to the tissues of the body. Anemia results in not enough oxygen reaching these tissues.  CAUSES  Common causes of anemia include:   Excessive bleeding. Bleeding may be internal or external. This includes excessive bleeding from periods (in women) or from the intestine.   Poor nutrition.   Chronic kidney, thyroid, and liver disease.  Bone marrow disorders that decrease red blood cell production.  Cancer and treatments for cancer.  HIV, AIDS, and their treatments.  Spleen problems that increase red blood cell destruction.  Blood disorders.  Excess destruction of red blood cells due to infection, medicines, and autoimmune disorders. SIGNS AND SYMPTOMS   Minor weakness.   Dizziness.   Headache.  Palpitations.   Shortness of breath, especially with exercise.   Paleness.  Cold sensitivity.  Indigestion.  Nausea.  Difficulty sleeping.  Difficulty concentrating. Symptoms may occur suddenly or they may develop slowly.  DIAGNOSIS  Additional blood tests are often needed. These help your health care provider determine the best treatment. Your health care provider will check your stool for blood and look for other causes of blood loss.  TREATMENT  Treatment varies depending on the cause of the anemia. Treatment can include:   Supplements of iron, vitamin W10, or folic acid.   Hormone medicines.   A blood transfusion. This may be needed if blood loss is severe.   Hospitalization. This may be needed if there is  significant continual blood loss.   Dietary changes.  Spleen removal. HOME CARE INSTRUCTIONS Keep all follow-up appointments. It often takes many weeks to correct anemia, and having your health care provider check on your condition and your response to treatment is very important. SEEK IMMEDIATE MEDICAL CARE IF:   You develop extreme weakness, shortness of breath, or chest pain.   You become dizzy or have trouble concentrating.  You develop heavy vaginal bleeding.   You develop a rash.   You have bloody or black, tarry stools.   You faint.   You vomit up blood.   You vomit repeatedly.   You have abdominal pain.  You have a fever or persistent symptoms for more than 2 3 days.   You have a fever and your symptoms suddenly get worse.   You are dehydrated.  MAKE SURE YOU:  Understand these instructions.  Will watch your condition.  Will get help right away if you are not doing well or get worse. Document Released: 09/07/2004 Document Revised: 04/02/2013 Document Reviewed: 01/24/2013 [facility] Patient Information 2014 [place].

## 2013-10-25 NOTE — ED Notes (Signed)
Family at bedside. Patient needs standby assist. Patient states that her legs are stiff from laying down. C/o pain in both legs. Left leg is worse. Family states that she has a blood clot in left leg.

## 2013-10-25 NOTE — ED Provider Notes (Signed)
CSN: [id number]     Arrival date & time 10/25/13  1951 History   First MD Initiated Contact with Patient 10/25/13 2114     Chief Complaint  Patient presents with  . Anemia     (Consider location/radiation/quality/duration/timing/severity/associated sxs/prior Treatment) HPI Comments: Patient returns at my request for further evaluation of anemia on labs drawn in oncology office yesterday. She denies any chest pain, shortness of breath, nausea, vomiting or abdominal pain. She endorses she had some rectal bleeding 3 days ago and again today there was bright red in the toilet bowel Stool has been brown. She denies any vomiting. She's on xarelto for acute DVT. She has had a colonoscopy in the past and was told that she had hemorroids.  The history is provided by the patient.    Past Medical History  Diagnosis Date  . CHF (congestive heart failure)   . Hypertension   . Arthritis   . Kidney stone   . Pneumonia     20 years ago  . DVT (deep venous thrombosis) 10/14/13    left leg  . Fracture of wrist 10/18/13    right  . Hip fracture, left    Past Surgical History  Procedure Laterality Date  . Appendectomy      as teenager  . Kidney stone surgery  jan 2012    stone remopved left side  . Hip pinning,cannulated  05/23/2011    Procedure: CANNULATED HIP PINNING;  Surgeon: Arther Abbott, MD;  Location: [facility];  Service: Orthopedics;  Laterality: Left;  late entry: updated foley documentation   Family History  Problem Relation Age of Onset  . Anesthesia problems Neg Hx   . Hypotension Neg Hx   . Malignant hyperthermia Neg Hx   . Pseudochol deficiency Neg Hx    History  Substance Use Topics  . Smoking status: Never Smoker   . Smokeless tobacco: Never Used  . Alcohol Use: No   OB History   Grav Para Term Preterm Abortions TAB SAB Ect Mult Living                 Review of Systems  Constitutional: Negative for fever, activity change and appetite change.  Respiratory: Negative  for cough, chest tightness and shortness of breath.   Cardiovascular: Negative for chest pain.  Gastrointestinal: Positive for blood in stool. Negative for nausea, vomiting and abdominal pain.  Genitourinary: Negative for dysuria, vaginal bleeding and vaginal discharge.  Musculoskeletal: Negative for arthralgias and myalgias.  Skin: Negative for rash.  Neurological: Negative for dizziness, weakness, light-headedness and headaches.      Allergies  Review of patient's allergies indicates no known allergies.  Home Medications   Current Outpatient Rx  Name  Route  Sig  Dispense  Refill  . Amino Acids-Protein Hydrolys (FEEDING SUPPLEMENT, PRO-STAT SUGAR FREE 64,) LIQD   Oral   Take 30 mLs by mouth 4 (four) times daily.         Marland Kitchen amLODipine (NORVASC) 2.5 MG tablet   Oral   Take 2.5 mg by mouth daily.          . furosemide (LASIX) 40 MG tablet   Oral   Take 40-80 mg by mouth 2 (two) times daily. Two tablets in the morning and one tablet in the evening For swelling         . gabapentin (NEURONTIN) 300 MG capsule   Oral   Take 300 mg by mouth 3 (three) times daily.         Marland Kitchen  levothyroxine (SYNTHROID, LEVOTHROID) 25 MCG tablet   Oral   Take 25 mcg by mouth every morning.          . Probiotic Product (PROBIOTIC DAILY PO)   Oral   Take 1 capsule by mouth daily.         . Rivaroxaban (XARELTO) 15 MG TABS tablet   Oral   Take 15 mg by mouth 2 (two) times daily with a meal.         . spironolactone (ALDACTONE) 25 MG tablet   Oral   Take 25 mg by mouth daily.         . traMADol (ULTRAM) 50 MG tablet   Oral   Take 1 tablet (50 mg total) by mouth every 6 (six) hours as needed.   15 tablet   0    BP 131/80  Pulse 122  Temp(Src) 98 F (36.7 C) (Oral)  Resp 16  Ht 5\' 5"  (1.651 m)  Wt 165 lb (74.844 kg)  BMI 27.46 kg/m2  SpO2 95% Physical Exam  Constitutional: She is oriented to person, place, and time. She appears well-developed and well-nourished. No  distress.  HENT:  Head: Normocephalic and atraumatic.  Mouth/Throat: Oropharynx is clear and moist. No oropharyngeal exudate.  Eyes: Conjunctivae and EOM are normal. Pupils are equal, round, and reactive to light.  Neck: Normal range of motion. Neck supple.  Cardiovascular: Normal rate, regular rhythm and normal heart sounds.   Pulmonary/Chest: Effort normal and breath sounds normal. No respiratory distress.  Abdominal: Soft. There is no tenderness. There is no rebound and no guarding.  Genitourinary: Guaiac positive stool.  External hemorroids, non thrombosed, no fissure Scant blood on examining finger, no stool Chaperone present  Musculoskeletal: Normal range of motion. She exhibits edema and tenderness.  R arm in splint. Fingers and hand ecchymotic and discolored as yesterday. Capillary refill <3 sec  Neurological: She is alert and oriented to person, place, and time. No cranial nerve deficit. She exhibits normal muscle tone. Coordination normal.  Skin: Skin is warm.    ED Course  Procedures (including critical care time) Labs Review Labs Reviewed  CBC WITH DIFFERENTIAL - Abnormal; Notable for the following:    RBC 3.29 (*)    Hemoglobin 9.9 (*)    HCT 29.4 (*)    RDW 15.9 (*)    All other components within normal limits  PROTIME-INR - Abnormal; Notable for the following:    Prothrombin Time 17.3 (*)    All other components within normal limits  BASIC METABOLIC PANEL - Abnormal; Notable for the following:    Glucose, Bld 106 (*)    BUN 38 (*)    GFR calc non Af Amer 50 (*)    GFR calc Af Amer 58 (*)    All other components within normal limits  POC OCCULT BLOOD, ED   Imaging Review Dg Forearm Right  10/24/2013   CLINICAL DATA:  Recently diagnosed wrist fracture, right upper extremity pain and swelling  EXAM: RIGHT FOREARM - 2 VIEW  COMPARISON:  DG HUMERUS*R* dated 10/24/2013; DG WRIST COMPLETE*R* dated 10/18/2013  FINDINGS: Comminuted distal right radial fracture is  reidentified. Mild apex volar angulation of the fracture fragments is noted. There is approximately 1/2 shaft width fracture fragment overlap. No significant callus formation. No radiopaque foreign body or soft tissue abnormality. No other forearm fracture is identified.  IMPRESSION: Distal right radial fracture reidentified.   Electronically Signed   By: Conchita Paris M.D.   On:  10/24/2013 17:58   US Venous Img Upper Uni Right  10/24/2013   CLINICAL DATA:  Right arm swelling and pain  EXAM: Right UPPER EXTREMITY VENOUS DOPPLER ULTRASOUND  TECHNIQUE: Gray-scale sonography with graded compression, as well as color Doppler and duplex ultrasound were performed to evaluate the upper extremity deep venous system from the level of the subclavian vein and including the jugular, axillary, basilic and upper cephalic vein. Spectral Doppler was utilized to evaluate flow at rest and with distal augmentation maneuvers.  COMPARISON:  None.  FINDINGS: Thrombus within deep veins:  None visualized.  Compressibility of deep veins:  Normal.  Duplex waveform respiratory phasicity:  Normal.  Duplex waveform response to augmentation:  Normal.  Venous reflux:  None visualized.  Other findings:  None visualized.  IMPRESSION: No evidence of right upper extremity deep venous thrombosis is noted. The area of clinical concern is likely related to the patient's known distal radial fracture.   Electronically Signed   By: Alcide Clever M.D.   On: 10/24/2013 17:05   US Arterial Seg Single  10/24/2013   CLINICAL DATA:  Right arm discoloration  EXAM: NONINVASIVE PHYSIOLOGIC VASCULAR STUDY OF BILATERAL upper EXTREMITIES  TECHNIQUE: Evaluation of both lower extremities were performed at rest, including calculation of ankle-brachial indices with single level Doppler, pressure and pulse volume recording.  COMPARISON:  None.  FINDINGS: Right wrist brachial index:  1.06  Left wrist brachial index:  1.08  Right Lower Extremity: Multiphasic waveforms  are noted although slightly dampened when compared with the left.  Left Lower Extremity:  Normal multiphasic waveforms.  IMPRESSION: Slight dampening of the waveforms in the right extremity although no definitive arterial abnormality is noted. The pain and swelling in the right hand and wrists is likely related to the patient's known underlying wrist fracture.   Electronically Signed   By: Alcide Clever M.D.   On: 10/24/2013 17:07   Dg Humerus Right  10/24/2013   CLINICAL DATA:  Right upper extremity pain, recent wrist fracture with immobilization in a cast  EXAM: RIGHT HUMERUS - 2+ VIEW  COMPARISON:  DG HAND COMPLETE*R* dated 10/24/2013; DG WRIST COMPLETE*R* dated 10/18/2013  FINDINGS: There is no evidence of fracture or other focal bone lesions. Soft tissues are unremarkable.  IMPRESSION: Negative.   Electronically Signed   By: Christiana Pellant M.D.   On: 10/24/2013 17:56   Dg Hand Complete Right  10/24/2013   CLINICAL DATA:  Right hand pain, history of known fracture  EXAM: RIGHT HAND - COMPLETE 3+ VIEW  COMPARISON:  None.  FINDINGS: There is a comminuted distal right radial fracture with impaction and posterior angulation at the fracture site. The degree of angulation has increased in the interval from the prior exam as has the degree of impaction at the fracture site. Mild ulnar styloid avulsion is noted. No new fracture is noted.  IMPRESSION: Distal radial and ulnar fractures with slight increase in impaction and increase in the degree of posterior angulation at the fracture site.   Electronically Signed   By: Alcide Clever M.D.   On: 10/24/2013 17:48     EKG Interpretation None      MDM   Final diagnoses:  Rectal bleeding  Anemia   Anemia found on labs yesterday, today admits to intermittent episodes of rectal bleeding.  No abdominal pain or vomiting. No dizziness or syncope.   FOBT +, but no stool, hemorrhoids seen. Hemoglobin 9.9, same as yesterday. Decreased from 2012.  HR 76 BP  121/61. Patient denies dizziness or lightheadedness.    D/w Dr. Darrick Meigs. Hemoglobin is stable, vitals are stable. Patient needs to remain on xarelto given acute DVT. He does not feel observation indicated as patient does not need endoscopy emergently.  He advises patient to follow up with PCP or GI on Monday for recheck. D.w patient and family who agree.  They will return to ED sooner with worsening bleeding, dizziness, lightheadedness, abdominal pain or chest pain or any other concerns.     Ezequiel Essex, MD 10/26/13 [phone]

## 2013-10-25 NOTE — ED Notes (Signed)
Seen here yesterday by hematologist, blood work done and received today. Called by ERMD to return here for re check

## 2013-10-25 NOTE — ED Provider Notes (Signed)
Labs ordered by hematology as outpatient were drawn in ED before discharge yesterday.  Hemoglobin 9.9, was 12 in 2012, and 10 in 2011.  No more recent values.  These values are difficult to interpret but there is concern for possible occult bleeding, especially in setting of xarelto use.  Message left with patient to return to ED for recheck of hemoglobin and FOBT.  Ezequiel Essex, MD 10/25/13 [phone]

## 2013-10-26 ENCOUNTER — Encounter (HOSPITAL_COMMUNITY): Payer: Self-pay | Admitting: Emergency Medicine

## 2013-10-26 ENCOUNTER — Observation Stay (HOSPITAL_COMMUNITY)
Admission: EM | Admit: 2013-10-26 | Discharge: 2013-10-30 | Disposition: A | Discharge status: Skilled Nursing Facility | Payer: Medicare HMO | Attending: Family Medicine | Admitting: Family Medicine

## 2013-10-26 ENCOUNTER — Other Ambulatory Visit: Payer: Self-pay

## 2013-10-26 ENCOUNTER — Emergency Department (HOSPITAL_COMMUNITY): Payer: Medicare HMO

## 2013-10-26 DIAGNOSIS — R1310 Dysphagia, unspecified: Secondary | ICD-10-CM | POA: Insufficient documentation

## 2013-10-26 DIAGNOSIS — E8809 Other disorders of plasma-protein metabolism, not elsewhere classified: Secondary | ICD-10-CM | POA: Insufficient documentation

## 2013-10-26 DIAGNOSIS — S52531A Colles' fracture of right radius, initial encounter for closed fracture: Secondary | ICD-10-CM | POA: Diagnosis present

## 2013-10-26 DIAGNOSIS — E039 Hypothyroidism, unspecified: Secondary | ICD-10-CM

## 2013-10-26 DIAGNOSIS — R609 Edema, unspecified: Secondary | ICD-10-CM

## 2013-10-26 DIAGNOSIS — D62 Acute posthemorrhagic anemia: Secondary | ICD-10-CM | POA: Diagnosis present

## 2013-10-26 DIAGNOSIS — K648 Other hemorrhoids: Principal | ICD-10-CM | POA: Insufficient documentation

## 2013-10-26 DIAGNOSIS — R0602 Shortness of breath: Secondary | ICD-10-CM

## 2013-10-26 DIAGNOSIS — Q438 Other specified congenital malformations of intestine: Secondary | ICD-10-CM | POA: Insufficient documentation

## 2013-10-26 DIAGNOSIS — N390 Urinary tract infection, site not specified: Secondary | ICD-10-CM

## 2013-10-26 DIAGNOSIS — I951 Orthostatic hypotension: Secondary | ICD-10-CM

## 2013-10-26 DIAGNOSIS — K2970 Gastritis, unspecified, without bleeding: Secondary | ICD-10-CM

## 2013-10-26 DIAGNOSIS — K449 Diaphragmatic hernia without obstruction or gangrene: Secondary | ICD-10-CM | POA: Diagnosis present

## 2013-10-26 DIAGNOSIS — Z7901 Long term (current) use of anticoagulants: Secondary | ICD-10-CM | POA: Insufficient documentation

## 2013-10-26 DIAGNOSIS — K5730 Diverticulosis of large intestine without perforation or abscess without bleeding: Secondary | ICD-10-CM | POA: Insufficient documentation

## 2013-10-26 DIAGNOSIS — I2789 Other specified pulmonary heart diseases: Secondary | ICD-10-CM | POA: Insufficient documentation

## 2013-10-26 DIAGNOSIS — K922 Gastrointestinal hemorrhage, unspecified: Secondary | ICD-10-CM | POA: Diagnosis present

## 2013-10-26 DIAGNOSIS — K2960 Other gastritis without bleeding: Secondary | ICD-10-CM

## 2013-10-26 DIAGNOSIS — S72002A Fracture of unspecified part of neck of left femur, initial encounter for closed fracture: Secondary | ICD-10-CM

## 2013-10-26 DIAGNOSIS — K5710 Diverticulosis of small intestine without perforation or abscess without bleeding: Secondary | ICD-10-CM | POA: Insufficient documentation

## 2013-10-26 DIAGNOSIS — K2990 Gastroduodenitis, unspecified, without bleeding: Secondary | ICD-10-CM

## 2013-10-26 DIAGNOSIS — Z86718 Personal history of other venous thrombosis and embolism: Secondary | ICD-10-CM | POA: Insufficient documentation

## 2013-10-26 DIAGNOSIS — S5290XD Unspecified fracture of unspecified forearm, subsequent encounter for closed fracture with routine healing: Secondary | ICD-10-CM | POA: Insufficient documentation

## 2013-10-26 DIAGNOSIS — D649 Anemia, unspecified: Secondary | ICD-10-CM | POA: Diagnosis present

## 2013-10-26 DIAGNOSIS — K625 Hemorrhage of anus and rectum: Secondary | ICD-10-CM

## 2013-10-26 DIAGNOSIS — S72009A Fracture of unspecified part of neck of unspecified femur, initial encounter for closed fracture: Secondary | ICD-10-CM

## 2013-10-26 DIAGNOSIS — K649 Unspecified hemorrhoids: Secondary | ICD-10-CM | POA: Diagnosis present

## 2013-10-26 DIAGNOSIS — S5292XA Unspecified fracture of left forearm, initial encounter for closed fracture: Secondary | ICD-10-CM

## 2013-10-26 DIAGNOSIS — I82409 Acute embolism and thrombosis of unspecified deep veins of unspecified lower extremity: Secondary | ICD-10-CM | POA: Diagnosis present

## 2013-10-26 DIAGNOSIS — D179 Benign lipomatous neoplasm, unspecified: Secondary | ICD-10-CM

## 2013-10-26 HISTORY — DX: Other gastritis without bleeding: K29.60

## 2013-10-26 HISTORY — DX: Acute embolism and thrombosis of unspecified deep veins of left lower extremity: I82.402

## 2013-10-26 HISTORY — DX: Diaphragmatic hernia without obstruction or gangrene: K44.9

## 2013-10-26 HISTORY — DX: Unspecified hemorrhoids: K64.9

## 2013-10-26 LAB — PROTIME-INR
INR: 1.85 — ABNORMAL HIGH (ref 0.00–1.49)
Prothrombin Time: 20.8 seconds — ABNORMAL HIGH (ref 11.6–15.2)

## 2013-10-26 LAB — CBC WITH DIFFERENTIAL/PLATELET
BASOS ABS: 0 10*3/uL (ref 0.0–0.1)
Basophils Relative: 1 % (ref 0–1)
Eosinophils Absolute: 0.1 10*3/uL (ref 0.0–0.7)
Eosinophils Relative: 1 % (ref 0–5)
HEMATOCRIT: 30 % — AB (ref 36.0–46.0)
Hemoglobin: 10 g/dL — ABNORMAL LOW (ref 12.0–15.0)
LYMPHS PCT: 23 % (ref 12–46)
Lymphs Abs: 1.9 10*3/uL (ref 0.7–4.0)
MCH: 29.9 pg (ref 26.0–34.0)
MCHC: 33.3 g/dL (ref 30.0–36.0)
MCV: 89.8 fL (ref 78.0–100.0)
MONO ABS: 1 10*3/uL (ref 0.1–1.0)
Monocytes Relative: 12 % (ref 3–12)
NEUTROS ABS: 5.3 10*3/uL (ref 1.7–7.7)
NEUTROS PCT: 64 % (ref 43–77)
Platelets: 453 10*3/uL — ABNORMAL HIGH (ref 150–400)
RBC: 3.34 MIL/uL — ABNORMAL LOW (ref 3.87–5.11)
RDW: 16.1 % — AB (ref 11.5–15.5)
WBC: 8.3 10*3/uL (ref 4.0–10.5)

## 2013-10-26 LAB — COMPREHENSIVE METABOLIC PANEL
ALBUMIN: 1.8 g/dL — AB (ref 3.5–5.2)
ALK PHOS: 146 U/L — AB (ref 39–117)
ALT: 17 U/L (ref 0–35)
AST: 22 U/L (ref 0–37)
BILIRUBIN TOTAL: 0.3 mg/dL (ref 0.3–1.2)
BUN: 36 mg/dL — ABNORMAL HIGH (ref 6–23)
CHLORIDE: 101 meq/L (ref 96–112)
CO2: 25 meq/L (ref 19–32)
Calcium: 9.4 mg/dL (ref 8.4–10.5)
Creatinine, Ser: 1.05 mg/dL (ref 0.50–1.10)
GFR calc Af Amer: 56 mL/min — ABNORMAL LOW (ref >90)
GFR, EST NON AFRICAN AMERICAN: 48 mL/min — AB (ref >90)
Glucose, Bld: 103 mg/dL — ABNORMAL HIGH (ref 70–99)
POTASSIUM: 4.1 meq/L (ref 3.7–5.3)
SODIUM: 137 meq/L (ref 137–147)
Total Protein: 4.5 g/dL — ABNORMAL LOW (ref 6.0–8.3)

## 2013-10-26 LAB — URINALYSIS, ROUTINE W REFLEX MICROSCOPIC
Bilirubin Urine: NEGATIVE
Glucose, UA: NEGATIVE mg/dL
Ketones, ur: NEGATIVE mg/dL
NITRITE: POSITIVE — AB
PH: 5.5 (ref 5.0–8.0)
Protein, ur: NEGATIVE mg/dL
Urobilinogen, UA: 0.2 mg/dL (ref 0.0–1.0)

## 2013-10-26 LAB — URINE MICROSCOPIC-ADD ON

## 2013-10-26 LAB — TSH: TSH: 4.699 u[IU]/mL — AB (ref 0.350–4.500)

## 2013-10-26 LAB — TYPE AND SCREEN
ABO/RH(D): A NEG
Antibody Screen: NEGATIVE

## 2013-10-26 LAB — TROPONIN I: Troponin I: <0.3 ng/mL (ref <0.30)

## 2013-10-26 LAB — PRO B NATRIURETIC PEPTIDE: Pro B Natriuretic peptide (BNP): 672.3 pg/mL — ABNORMAL HIGH (ref 0–450)

## 2013-10-26 MED ORDER — LEVOTHYROXINE SODIUM 25 MCG PO TABS
25.0000 ug | ORAL_TABLET | Freq: Every day | ORAL | Status: DC
Start: 2013-10-27 — End: 2013-10-30
  Administered 2013-10-27 – 2013-10-30 (×3): 25 ug via ORAL
  Filled 2013-10-26 (×3): qty 1

## 2013-10-26 MED ORDER — FUROSEMIDE 40 MG PO TABS
40.0000 mg | ORAL_TABLET | Freq: Every evening | ORAL | Status: DC
  Administered 2013-10-26 – 2013-10-29 (×4): 40 mg via ORAL
  Filled 2013-10-26 (×4): qty 1

## 2013-10-26 MED ORDER — SODIUM CHLORIDE 0.9 % IV SOLN: 250.0000 mL | INTRAVENOUS | Status: DC | PRN

## 2013-10-26 MED ORDER — SPIRONOLACTONE 25 MG PO TABS
25.0000 mg | ORAL_TABLET | Freq: Every day | ORAL | Status: DC
  Administered 2013-10-27 – 2013-10-30 (×4): 25 mg via ORAL
  Filled 2013-10-26 (×4): qty 1

## 2013-10-26 MED ORDER — SODIUM CHLORIDE 0.9 % IV BOLUS (SEPSIS)
1000.0000 mL | Freq: Once | INTRAVENOUS | Status: AC
  Administered 2013-10-26: 1000 mL via INTRAVENOUS

## 2013-10-26 MED ORDER — FUROSEMIDE 80 MG PO TABS
80.0000 mg | ORAL_TABLET | Freq: Every day | ORAL | Status: DC
  Administered 2013-10-27 – 2013-10-30 (×4): 80 mg via ORAL
  Filled 2013-10-26 (×4): qty 1

## 2013-10-26 MED ORDER — SODIUM CHLORIDE 0.9 % IV SOLN
INTRAVENOUS | Status: AC
  Administered 2013-10-26: 19:00:00 via INTRAVENOUS

## 2013-10-26 MED ORDER — HYDROCODONE-ACETAMINOPHEN 5-325 MG PO TABS
1.0000 | ORAL_TABLET | Freq: Once | ORAL | Status: AC
  Administered 2013-10-26: 1 via ORAL
  Filled 2013-10-26: qty 1

## 2013-10-26 MED ORDER — SODIUM CHLORIDE 0.9 % IJ SOLN
3.0000 mL | Freq: Two times a day (BID) | INTRAMUSCULAR | Status: DC
  Administered 2013-10-26 – 2013-10-30 (×7): 3 mL via INTRAVENOUS

## 2013-10-26 MED ORDER — ONDANSETRON HCL 4 MG PO TABS: 4.0000 mg | ORAL_TABLET | Freq: Four times a day (QID) | ORAL | Status: DC | PRN

## 2013-10-26 MED ORDER — ONDANSETRON HCL 4 MG/2ML IJ SOLN: 4.0000 mg | Freq: Four times a day (QID) | INTRAMUSCULAR | Status: DC | PRN

## 2013-10-26 MED ORDER — DEXTROSE 5 % IV SOLN
1.0000 g | Freq: Once | INTRAVENOUS | Status: AC
  Administered 2013-10-26: 1 g via INTRAVENOUS
  Filled 2013-10-26: qty 10

## 2013-10-26 MED ORDER — PRO-STAT SUGAR FREE PO LIQD
30.0000 mL | Freq: Four times a day (QID) | ORAL | Status: DC
  Administered 2013-10-26 – 2013-10-30 (×15): 30 mL via ORAL
  Filled 2013-10-26 (×14): qty 30

## 2013-10-26 MED ORDER — HYDROCORTISONE ACETATE 25 MG RE SUPP
25.0000 mg | Freq: Two times a day (BID) | RECTAL | Status: DC
  Administered 2013-10-26 – 2013-10-30 (×6): 25 mg via RECTAL
  Filled 2013-10-26 (×10): qty 1

## 2013-10-26 MED ORDER — TRAMADOL HCL 50 MG PO TABS
50.0000 mg | ORAL_TABLET | Freq: Four times a day (QID) | ORAL | Status: DC | PRN
  Administered 2013-10-26: 50 mg via ORAL
  Filled 2013-10-26: qty 1

## 2013-10-26 MED ORDER — GABAPENTIN 300 MG PO CAPS
300.0000 mg | ORAL_CAPSULE | Freq: Three times a day (TID) | ORAL | Status: DC
  Administered 2013-10-26 – 2013-10-30 (×10): 300 mg via ORAL
  Filled 2013-10-26 (×4): qty 1
  Filled 2013-10-26: qty 3
  Filled 2013-10-26 (×5): qty 1

## 2013-10-26 MED ORDER — AMLODIPINE BESYLATE 5 MG PO TABS
2.5000 mg | ORAL_TABLET | Freq: Every day | ORAL | Status: DC
  Administered 2013-10-27 – 2013-10-30 (×4): 2.5 mg via ORAL
  Filled 2013-10-26 (×5): qty 1

## 2013-10-26 MED ORDER — SODIUM CHLORIDE 0.9 % IJ SOLN: 3.0000 mL | INTRAMUSCULAR | Status: DC | PRN

## 2013-10-26 NOTE — ED Notes (Signed)
Pt c/o intermittent rectal bleeding since Wednesday and weakness. Pt started on Xarelto x 2 weeks ago for DVT LLE.

## 2013-10-26 NOTE — ED Provider Notes (Signed)
CSN: [id number]     Arrival date & time 10/26/13  1623 History   First MD Initiated Contact with Patient 10/26/13 1636     Chief Complaint  Patient presents with  . Rectal Bleeding     (Consider location/radiation/quality/duration/timing/severity/associated sxs/prior Treatment) HPI Comments: Patient returns today with worsening rectal bleeding. She was seen yesterday and admission was discussed but after discussion with hospitalist it was decided to observe her at home as her hemoglobin was stable. Her daughter called me today to say patient was becoming more weak and had another bloody bowel movement. Today she developed another episode of bright red blood after a bowel movement. She endorses generalized weakness and lightheadedness. No focal numbness or tingling. No chest pain or abdominal pain. She is on xarelto for acute DVT. She complains of pain in her left leg from the DVT. She's been eating and drinking well at home. She denies any syncope. No chest pain or shortness of breath.  The history is provided by the patient.    Past Medical History  Diagnosis Date  . CHF (congestive heart failure)   . Hypertension   . Arthritis   . Kidney stone   . Pneumonia     20 years ago  . DVT (deep venous thrombosis) 10/14/13    left leg  . Fracture of wrist 10/18/13    right  . Hip fracture, left   . Deep vein blood clot of left lower extremity    Past Surgical History  Procedure Laterality Date  . Appendectomy      as teenager  . Kidney stone surgery  jan 2012    stone remopved left side  . Hip pinning,cannulated  05/23/2011    Procedure: CANNULATED HIP PINNING;  Surgeon: Arther Abbott, MD;  Location: [facility];  Service: Orthopedics;  Laterality: Left;  late entry: updated foley documentation  . Hip open reduction Left 2012   Family History  Problem Relation Age of Onset  . Anesthesia problems Neg Hx   . Hypotension Neg Hx   . Malignant hyperthermia Neg Hx   . Pseudochol deficiency  Neg Hx    History  Substance Use Topics  . Smoking status: Never Smoker   . Smokeless tobacco: Never Used  . Alcohol Use: No   OB History   Grav Para Term Preterm Abortions TAB SAB Ect Mult Living                 Review of Systems  Constitutional: Positive for activity change, appetite change and fatigue. Negative for fever.  Respiratory: Negative for cough, chest tightness and shortness of breath.   Cardiovascular: Negative for chest pain.  Gastrointestinal: Positive for blood in stool and hematochezia. Negative for nausea, vomiting and abdominal pain.  Genitourinary: Negative for vaginal bleeding and vaginal discharge.  Musculoskeletal: Positive for arthralgias and myalgias. Negative for back pain.  Skin: Negative for rash.  Neurological: Positive for weakness and light-headedness. Negative for dizziness and headaches.  A complete 10 system review of systems was obtained and all systems are negative except as noted in the HPI and PMH.      Allergies  Review of patient's allergies indicates no known allergies.  Home Medications   No current outpatient prescriptions on file. BP 100/62  Pulse 78  Temp(Src) 97.2 F (36.2 C) (Oral)  Resp 18  Ht 5\' 4"  (1.626 m)  Wt 167 lb (75.751 kg)  BMI 28.65 kg/m2  SpO2 99% Physical Exam  Constitutional: She is oriented to  person, place, and time. She appears well-developed and well-nourished. No distress.  HENT:  Head: Normocephalic and atraumatic.  Mouth/Throat: Oropharynx is clear and moist. No oropharyngeal exudate.  Eyes: Conjunctivae and EOM are normal. Pupils are equal, round, and reactive to light.  Neck: Normal range of motion. Neck supple.  Cardiovascular: Normal rate, regular rhythm and normal heart sounds.   Pulmonary/Chest: Effort normal and breath sounds normal. No respiratory distress. She has no rales.  Abdominal: Soft. There is no tenderness. There is no rebound and no guarding.  Genitourinary:  Rectal exam  deferred as performed yesterday by myself  Musculoskeletal: Normal range of motion. She exhibits edema. She exhibits no tenderness.  R arm in splint. Fingers and hand ecchymotic and discolored as yesterday. Capillary refill <3 sec   Bilateral lower extremity edema, left greater than right. Distal pulses intact. Ecchymosis to right hip. Not painful. Full range of motion.  Neurological: She is alert and oriented to person, place, and time. No cranial nerve deficit. She exhibits normal muscle tone. Coordination normal.  Skin: Skin is warm.    ED Course  Procedures (including critical care time) Labs Review Labs Reviewed  CBC WITH DIFFERENTIAL - Abnormal; Notable for the following:    RBC 3.34 (*)    Hemoglobin 10.0 (*)    HCT 30.0 (*)    RDW 16.1 (*)    Platelets 453 (*)    All other components within normal limits  COMPREHENSIVE METABOLIC PANEL - Abnormal; Notable for the following:    Glucose, Bld 103 (*)    BUN 36 (*)    Total Protein 4.5 (*)    Albumin 1.8 (*)    Alkaline Phosphatase 146 (*)    GFR calc non Af Amer 48 (*)    GFR calc Af Amer 56 (*)    All other components within normal limits  PROTIME-INR - Abnormal; Notable for the following:    Prothrombin Time 20.8 (*)    INR 1.85 (*)    All other components within normal limits  URINALYSIS, ROUTINE W REFLEX MICROSCOPIC - Abnormal; Notable for the following:    Color, Urine RED (*)    APPearance CLOUDY (*)    Specific Gravity, Urine <1.005 (*)    Hgb urine dipstick LARGE (*)    Nitrite POSITIVE (*)    Leukocytes, UA SMALL (*)    All other components within normal limits  PRO B NATRIURETIC PEPTIDE - Abnormal; Notable for the following:    Pro B Natriuretic peptide (BNP) 672.3 (*)    All other components within normal limits  URINE MICROSCOPIC-ADD ON - Abnormal; Notable for the following:    Bacteria, UA MANY (*)    All other components within normal limits  TROPONIN I  CBC  BASIC METABOLIC PANEL  TYPE AND SCREEN    Imaging Review Dg Chest Portable 1 View  10/26/2013   CLINICAL DATA:  RECTAL BLEEDING.  WEAKNESS.  EXAM: PORTABLE CHEST - 1 VIEW  COMPARISON:  05/22/2011  FINDINGS: Heart size and pulmonary vascularity are normal. There is chronic elevation of the right hemidiaphragm. There appear to be new small bilateral pleural effusions. No infiltrates. No acute osseous abnormality.  IMPRESSION: New small bilateral pleural effusions.   Electronically Signed   By: Rozetta Nunnery M.D.   On: 10/26/2013 17:06     EKG Interpretation None      MDM   Final diagnoses:  Rectal bleeding  Orthostatic hypotension   persistent rectal bleeding with generalized weakness seen  yesterday for same. On xarelto for DVT. No abdominal pain or chest pain.  Hemoglobin stable at 10. Patient denies abdominal pain or chest pain. Just generalized weakness. She is orthostatic by HR and IVF are ordered. CHF listed in history but EF normal on last echo. Tiny pleural effusions on CXR today.    Patient is hemodynamically stable and her hemoglobin is stable. However daughters feel she is becoming more weak at home and having ongoing bleeding. Source appears to be hemorrhoidal, no colonoscopy records found in epic.  With age, anticoagulant use, and recurrent visits, will ask hospitalist to observe overnight.  Dr. Shanon Brow agrees. Will gently hydrate, hold xarelto today.  BP 100/62  Pulse 78  Temp(Src) 97.2 F (36.2 C) (Oral)  Resp 18  Ht 5\' 4"  (1.626 m)  Wt 167 lb (75.751 kg)  BMI 28.65 kg/m2  SpO2 99%   Ezequiel Essex, MD 10/27/13 0004

## 2013-10-26 NOTE — ED Notes (Signed)
Report attempted 

## 2013-10-26 NOTE — H&P (Signed)
PCP:   Delphina Cahill, MD   Chief Complaint:  Blood in poop  HPI: 80 yo female recent dvt 3/15 started on xarolto, chf, recent fall with right wrist fracture comes in with lgib, has h/o hemorrhoids, has had several episodes of bleeding with bm.  No n/v.  No abd pain.  No diarrhea.  No fevers.  No dysuria.  On rectal exam with edp had some bright blood with brown stool and external hemorrhoids.  This is actually her 3rd ED visit this weekend with her dtrs.  They are very frustrated because she was not admitted last night.  They received a phone call from dr rancour this am to bring her back to the ED for concerns of her anemia.    Review of Systems:  Positive and negative as per HPI otherwise all other systems are negative  Past Medical History: Past Medical History  Diagnosis Date  . CHF (congestive heart failure)   . Hypertension   . Arthritis   . Kidney stone   . Pneumonia     20 years ago  . DVT (deep venous thrombosis) 10/14/13    left leg  . Fracture of wrist 10/18/13    right  . Hip fracture, left    Past Surgical History  Procedure Laterality Date  . Appendectomy      as teenager  . Kidney stone surgery  jan 2012    stone remopved left side  . Hip pinning,cannulated  05/23/2011    Procedure: CANNULATED HIP PINNING;  Surgeon: Arther Abbott, MD;  Location: [facility];  Service: Orthopedics;  Laterality: Left;  late entry: updated foley documentation    Medications: Prior to Admission medications   Medication Sig Start Date End Date Taking? Authorizing Provider  Amino Acids-Protein Hydrolys (FEEDING SUPPLEMENT, PRO-STAT SUGAR FREE 64,) LIQD Take 30 mLs by mouth 4 (four) times daily.   Yes Historical Provider, MD  amLODipine (NORVASC) 2.5 MG tablet Take 2.5 mg by mouth daily.    Yes Historical Provider, MD  furosemide (LASIX) 40 MG tablet Take 40-80 mg by mouth 2 (two) times daily. Two tablets in the morning and one tablet in the evening For swelling   Yes Historical Provider, MD   gabapentin (NEURONTIN) 300 MG capsule Take 300 mg by mouth 3 (three) times daily.   Yes Historical Provider, MD  levothyroxine (SYNTHROID, LEVOTHROID) 25 MCG tablet Take 25 mcg by mouth every morning.  10/13/13  Yes Historical Provider, MD  Probiotic Product (PROBIOTIC DAILY PO) Take 1 capsule by mouth daily.   Yes Historical Provider, MD  Rivaroxaban (XARELTO) 15 MG TABS tablet Take 15 mg by mouth 2 (two) times daily with a meal.   Yes Historical Provider, MD  spironolactone (ALDACTONE) 25 MG tablet Take 25 mg by mouth daily.   Yes Historical Provider, MD  traMADol (ULTRAM) 50 MG tablet Take 1 tablet (50 mg total) by mouth every 6 (six) hours as needed. 10/18/13  Yes Johnna Acosta, MD    Allergies:  No Known Allergies  Social History:  reports that she has never smoked. She has never used smokeless tobacco. She reports that she does not drink alcohol or use illicit drugs.  Family History: Family History  Problem Relation Age of Onset  . Anesthesia problems Neg Hx   . Hypotension Neg Hx   . Malignant hyperthermia Neg Hx   . Pseudochol deficiency Neg Hx     Physical Exam: Filed Vitals:   10/26/13 1631 10/26/13 1716 10/26/13 1838  10/26/13 1845  BP: 128/67 101/45 110/48 106/57  Pulse: 92 86 80 71  Temp: 97.2 F (36.2 C)     TempSrc: Oral     Resp: 16 16 14 18   Height: 5\' 4"  (1.626 m)     Weight: 75.751 kg (167 lb)     SpO2: 100% 100% 98% 100%   General appearance: alert, cooperative and no distress Head: Normocephalic, without obvious abnormality, atraumatic Eyes: negative Nose: Nares normal. Septum midline. Mucosa normal. No drainage or sinus tenderness. Neck: no JVD and supple, symmetrical, trachea midline Lungs: clear to auscultation bilaterally Heart: regular rate and rhythm, S1, S2 normal, no murmur, click, rub or gallop Abdomen: soft, non-tender; bowel sounds normal; no masses,  no organomegaly Extremities: extremities normal, atraumatic, no cyanosis or edema Pulses: 2+  and symmetric Skin: Skin color, texture, turgor normal. No rashes or lesions Neurologic: Grossly normal   Labs on Admission:   Recent Labs  10/25/13 2137 10/26/13 1701  NA 139 137  K 4.4 4.1  CL 105 101  CO2 28 25  GLUCOSE 106* 103*  BUN 38* 36*  CREATININE 1.01 1.05  CALCIUM 9.3 9.4    Recent Labs  10/24/13 1859 10/26/13 1701  AST 19 22  ALT 16 17  ALKPHOS 125* 146*  BILITOT 0.4 0.3  PROT 4.4* 4.5*  ALBUMIN 1.8* 1.8*    Recent Labs  10/25/13 2137 10/26/13 1701  WBC 7.6 8.3  NEUTROABS 4.6 5.3  HGB 9.9* 10.0*  HCT 29.4* 30.0*  MCV 89.4 89.8  PLT 363 453*    Recent Labs  10/26/13 1701  TROPONINI <0.30    Recent Labs  10/24/13 1859  TSH 4.699*    Recent Labs  10/24/13 1859  RETICCTPCT 4.5*    Radiological Exams on Admission:  Dg Chest Portable 1 View  10/26/2013   CLINICAL DATA:  RECTAL BLEEDING.  WEAKNESS.  EXAM: PORTABLE CHEST - 1 VIEW  COMPARISON:  05/22/2011  FINDINGS: Heart size and pulmonary vascularity are normal. There is chronic elevation of the right hemidiaphragm. There appear to be new small bilateral pleural effusions. No infiltrates. No acute osseous abnormality.  IMPRESSION: New small bilateral pleural effusions.   Electronically Signed   By: Rozetta Nunnery M.D.   On: 10/26/2013 17:06    Assessment/Plan  80 yo female with rectal bleeding newly on xaralto from dvt  Principal Problem:   Rectal bleeding-  H/h stable at baseline.  Probably from hemorrhoids.  Place on supp anusol.  Reck h/h in am.  Hold xarolto overnight.  Active Problems:   HYPERTENSION, PULMONARY   DVT- would consider changing to coumadin purely for the ease of reversal if she does have significant bleed in the future.     Colles' fracture of right radius   Hypoalbuminemia   Chronic anemia    Teoman Giraud A 10/26/2013, 6:49 PM

## 2013-10-26 NOTE — ED Notes (Addendum)
Pt states she is only noticing blood with bowel movements. Small amount of bright red blood and stool to pad noted. Symptoms began Wednesday, intermittently. 2+ edema noted to lower extremities. Right lower arm is in a splint. NAD.

## 2013-10-27 ENCOUNTER — Telehealth: Payer: Self-pay | Admitting: *Deleted

## 2013-10-27 ENCOUNTER — Encounter (HOSPITAL_COMMUNITY): Payer: Self-pay | Admitting: Gastroenterology

## 2013-10-27 DIAGNOSIS — E8809 Other disorders of plasma-protein metabolism, not elsewhere classified: Secondary | ICD-10-CM

## 2013-10-27 DIAGNOSIS — K922 Gastrointestinal hemorrhage, unspecified: Secondary | ICD-10-CM

## 2013-10-27 DIAGNOSIS — S52539A Colles' fracture of unspecified radius, initial encounter for closed fracture: Secondary | ICD-10-CM

## 2013-10-27 DIAGNOSIS — I82409 Acute embolism and thrombosis of unspecified deep veins of unspecified lower extremity: Secondary | ICD-10-CM

## 2013-10-27 DIAGNOSIS — K625 Hemorrhage of anus and rectum: Secondary | ICD-10-CM

## 2013-10-27 DIAGNOSIS — D62 Acute posthemorrhagic anemia: Secondary | ICD-10-CM | POA: Diagnosis present

## 2013-10-27 LAB — CBC
HEMATOCRIT: 25.1 % — AB (ref 36.0–46.0)
HEMATOCRIT: 29.1 % — AB (ref 36.0–46.0)
Hemoglobin: 8.1 g/dL — ABNORMAL LOW (ref 12.0–15.0)
Hemoglobin: 9.5 g/dL — ABNORMAL LOW (ref 12.0–15.0)
MCH: 29.1 pg (ref 26.0–34.0)
MCH: 30 pg (ref 26.0–34.0)
MCHC: 32.3 g/dL (ref 30.0–36.0)
MCHC: 32.6 g/dL (ref 30.0–36.0)
MCV: 90.3 fL (ref 78.0–100.0)
MCV: 91.8 fL (ref 78.0–100.0)
PLATELETS: 348 10*3/uL (ref 150–400)
Platelets: 393 10*3/uL (ref 150–400)
RBC: 2.78 MIL/uL — ABNORMAL LOW (ref 3.87–5.11)
RBC: 3.17 MIL/uL — ABNORMAL LOW (ref 3.87–5.11)
RDW: 16 % — AB (ref 11.5–15.5)
RDW: 16.1 % — AB (ref 11.5–15.5)
WBC: 6.9 10*3/uL (ref 4.0–10.5)
WBC: 7.8 10*3/uL (ref 4.0–10.5)

## 2013-10-27 LAB — BASIC METABOLIC PANEL
BUN: 32 mg/dL — AB (ref 6–23)
CALCIUM: 8.5 mg/dL (ref 8.4–10.5)
CO2: 26 mEq/L (ref 19–32)
CREATININE: 1 mg/dL (ref 0.50–1.10)
Chloride: 110 mEq/L (ref 96–112)
GFR, EST AFRICAN AMERICAN: 59 mL/min — AB (ref >90)
GFR, EST NON AFRICAN AMERICAN: 51 mL/min — AB (ref >90)
Glucose, Bld: 96 mg/dL (ref 70–99)
Potassium: 4.1 mEq/L (ref 3.7–5.3)
Sodium: 141 mEq/L (ref 137–147)

## 2013-10-27 LAB — PROTEIN, URINE, 24 HOUR
Collection Interval-UPROT: 24 hours
Protein, 24H Urine: 213 mg/d — ABNORMAL HIGH (ref 50–100)
Protein, Urine: 17 mg/dL
URINE TOTAL VOLUME-UPROT: 1250 mL

## 2013-10-27 LAB — KAPPA/LAMBDA LIGHT CHAINS
Kappa free light chain: 3.84 mg/dL — ABNORMAL HIGH (ref 0.33–1.94)
Kappa, lambda light chain ratio: 1.33 (ref 0.26–1.65)
Lambda free light chains: 2.88 mg/dL — ABNORMAL HIGH (ref 0.57–2.63)

## 2013-10-27 MED ORDER — PANTOPRAZOLE SODIUM 40 MG PO TBEC
40.0000 mg | DELAYED_RELEASE_TABLET | Freq: Every day | ORAL | Status: DC
  Administered 2013-10-27: 40 mg via ORAL
  Filled 2013-10-27 (×2): qty 1

## 2013-10-27 MED ORDER — PEG 3350-KCL-NA BICARB-NACL 420 G PO SOLR
2000.0000 mL | Freq: Once | ORAL | Status: AC
  Administered 2013-10-27: 2000 mL via ORAL
  Filled 2013-10-27: qty 4000

## 2013-10-27 MED ORDER — PEG 3350-KCL-NA BICARB-NACL 420 G PO SOLR
2000.0000 mL | Freq: Once | ORAL | Status: AC
  Administered 2013-10-28: 2000 mL via ORAL

## 2013-10-27 NOTE — Consult Note (Addendum)
Referring Provider: Dr. Roderic Palau Primary Care Physician:  Delphina Cahill, MD Primary Gastroenterologist:  Dr. Gala Romney   Date of Admission: 10/26/13 Date of Consultation: 10/27/13  Reason for Consultation:  Rectal bleeding  HPI:  Holly Sloan is an 80 year old female who was diagnosed with DVT of LLE on October 15, 2013, on Xarelto. Last dose on 3/15 in the morning.  Presented with bright red blood per rectum to ED yesterday evening. Daughters frustrated that patient was not admitted during earlier presentation to the ED on 3/14. Admitting Hgb 10, down to 8 this morning. Appears Hgb normal in 2012 around the 12/13 range. Last EGD/colonoscopy in 2003 with gastric polyps, left-sided transverse diverticula.   States large amount bright red blood per rectum on 3 occasions prior to ED evaluation. States she has noted rectal bleeding over the past month or so, just becoming more consistent in the past week. No abdominal pain. No constipation, diarrhea. BM this morning but NO further bleeding. No itching/burning per rectum. Takes Advil rarely. No changes in appetite, weight loss. No indigestion. Occasional solid food dysphagia, specifically with meat. Last dose of Xarelto yesterday morning. This has been on hold since admission. Exam in ED on 3/14 with non-thrombosed external hemorrhoids, scant blood.   Past Medical History  Diagnosis Date  . CHF (congestive heart failure)   . Hypertension   . Arthritis   . Kidney stone   . Pneumonia     20 years ago  . DVT (deep venous thrombosis) 10/14/13    left leg  . Fracture of wrist 10/18/13    right  . Hip fracture, left   . Deep vein blood clot of left lower extremity     Past Surgical History  Procedure Laterality Date  . Appendectomy      as teenager  . Kidney stone surgery  jan 2012    stone remopved left side  . Hip pinning,cannulated  05/23/2011    Procedure: CANNULATED HIP PINNING;  Surgeon: Arther Abbott, MD;  Location: [facility];  Service:  Orthopedics;  Laterality: Left;  late entry: updated foley documentation  . Hip open reduction Left 2012  . Esophagogastroduodenoscopy  April 2003    Dr. Gala Romney: 2 polyps proximal stomach, moderate sized hiatal hernia  . Colonoscopy  April 2003    Dr. Gala Romney: left-sided transverse diverticula    Prior to Admission medications   Medication Sig Start Date End Date Taking? Authorizing Provider  Amino Acids-Protein Hydrolys (FEEDING SUPPLEMENT, PRO-STAT SUGAR FREE 64,) LIQD Take 30 mLs by mouth 4 (four) times daily.   Yes Historical Provider, MD  amLODipine (NORVASC) 2.5 MG tablet Take 2.5 mg by mouth daily.    Yes Historical Provider, MD  furosemide (LASIX) 40 MG tablet Take 40-80 mg by mouth 2 (two) times daily. Two tablets in the morning and one tablet in the evening For swelling   Yes Historical Provider, MD  gabapentin (NEURONTIN) 300 MG capsule Take 300 mg by mouth 3 (three) times daily.   Yes Historical Provider, MD  levothyroxine (SYNTHROID, LEVOTHROID) 25 MCG tablet Take 25 mcg by mouth every morning.  10/13/13  Yes Historical Provider, MD  Probiotic Product (PROBIOTIC DAILY PO) Take 1 capsule by mouth daily.   Yes Historical Provider, MD  Rivaroxaban (XARELTO) 15 MG TABS tablet Take 15 mg by mouth 2 (two) times daily with a meal.   Yes Historical Provider, MD  spironolactone (ALDACTONE) 25 MG tablet Take 25 mg by mouth daily.  Yes Historical Provider, MD  traMADol (ULTRAM) 50 MG tablet Take 1 tablet (50 mg total) by mouth every 6 (six) hours as needed. 10/18/13  Yes Johnna Acosta, MD    Current Facility-Administered Medications  Medication Dose Route Frequency Provider Last Rate Last Dose  . 0.9 %  sodium chloride infusion  250 mL Intravenous PRN Phillips Grout, MD      . amLODipine (NORVASC) tablet 2.5 mg  2.5 mg Oral Daily Rachal A Shanon Brow, MD      . feeding supplement (PRO-STAT SUGAR FREE 64) liquid 30 mL  30 mL Oral QID Phillips Grout, MD   30 mL at 10/26/13 2150  . furosemide (LASIX)  tablet 40 mg  40 mg Oral QPM Phillips Grout, MD   40 mg at 10/26/13 2149  . furosemide (LASIX) tablet 80 mg  80 mg Oral Daily Phillips Grout, MD      . gabapentin (NEURONTIN) capsule 300 mg  300 mg Oral TID Phillips Grout, MD   300 mg at 10/26/13 2149  . hydrocortisone (ANUSOL-HC) suppository 25 mg  25 mg Rectal BID Phillips Grout, MD   25 mg at 10/26/13 2149  . levothyroxine (SYNTHROID, LEVOTHROID) tablet 25 mcg  25 mcg Oral QAC breakfast Phillips Grout, MD      . ondansetron [facility]) tablet 4 mg  4 mg Oral Q6H PRN Phillips Grout, MD       Or  . ondansetron (ZOFRAN) injection 4 mg  4 mg Intravenous Q6H PRN Phillips Grout, MD      . sodium chloride 0.9 % injection 3 mL  3 mL Intravenous Q12H Phillips Grout, MD   3 mL at 10/26/13 2200  . sodium chloride 0.9 % injection 3 mL  3 mL Intravenous PRN Phillips Grout, MD      . spironolactone (ALDACTONE) tablet 25 mg  25 mg Oral Daily Phillips Grout, MD      . traMADol Veatrice Bourbon) tablet 50 mg  50 mg Oral Q6H PRN Dianne Dun, NP   50 mg at 10/26/13 2149    Allergies as of 10/26/2013  . (No Known Allergies)    Family History  Problem Relation Age of Onset  . Anesthesia problems Neg Hx   . Hypotension Neg Hx   . Malignant hyperthermia Neg Hx   . Pseudochol deficiency Neg Hx   . Colon cancer Neg Hx     History   Social History  . Marital Status: Widowed    Spouse Name: N/A    Number of Children: N/A  . Years of Education: N/A   Occupational History  . Not on file.   Social History Main Topics  . Smoking status: Never Smoker   . Smokeless tobacco: Never Used  . Alcohol Use: No  . Drug Use: No  . Sexual Activity: Not Currently   Other Topics Concern  . Not on file   Social History Narrative  . No narrative on file    Review of Systems: Gen: Denies fever, chills, loss of appetite, change in weight or weight loss CV: Denies chest pain, heart palpitations, syncope, edema  Resp: +DOE GI: see HPI MS: +joint pain Derm:  Denies rash, itching, dry skin Psych: Denies depression, anxiety,confusion, or memory loss Heme: Denies bruising, bleeding, and enlarged lymph nodes.  Physical Exam: Vital signs in last 24 hours: Temp:  [97.2 F (36.2 C)-98.8 F (37.1 C)] 97.5 F (36.4 C) (03/16 0535) Pulse  Rate:  [67-92] 67 (03/16 0535) Resp:  [14-18] 18 (03/16 0535) BP: (98-128)/(45-72) 98/49 mmHg (03/16 0535) SpO2:  [94 %-100 %] 97 % (03/16 0535) Weight:  [167 lb (75.751 kg)] 167 lb (75.751 kg) (03/15 1631) Last BM Date: 10/26/13 General:   Alert,  Well-developed, well-nourished, pleasant and cooperative in NAD Head:  Normocephalic and atraumatic. Eyes:  Sclera clear, no icterus.   Conjunctiva pink. Ears:  Mild HOH Nose:  No deformity, discharge,  or lesions. Mouth:  No deformity or lesions, dentition normal. Neck:  Supple; no masses or thyromegaly. Lungs:  Clear throughout to auscultation.    Heart:  S1 S2 present, no murmurs Abdomen:  Soft, nontender and nondistended. No masses, hepatosplenomegaly or hernias noted. Normal bowel sounds, without guarding, and without rebound.   Rectal:  Deferred until time of colonoscopy.   Extremities:  Bilateral 2+ lower extremity pitting edema Neurologic:  Alert and  oriented x4;  grossly normal neurologically. Skin:  Intact without significant lesions or rashes. Cervical Nodes:  No significant cervical adenopathy. Psych:  Alert and cooperative. Normal mood and affect.  Intake/Output from previous day: 03/15 0701 - 03/16 0700 In: -  Out: 1 [Urine:1] Intake/Output this shift:    Lab Results:  Recent Labs  10/25/13 2137 10/26/13 1701 10/27/13 0513  WBC 7.6 8.3 6.9  HGB 9.9* 10.0* 8.1*  HCT 29.4* 30.0* 25.1*  PLT 363 453* 348   BMET  Recent Labs  10/25/13 2137 10/26/13 1701 10/27/13 0513  NA 139 137 141  K 4.4 4.1 4.1  CL 105 101 110  CO2 $Re'28 25 26  'RZA$ GLUCOSE 106* 103* 96  BUN 38* 36* 32*  CREATININE 1.01 1.05 1.00  CALCIUM 9.3 9.4 8.5    LFT  Recent Labs  10/24/13 1859 10/26/13 1701  PROT 4.4* 4.5*  ALBUMIN 1.8* 1.8*  AST 19 22  ALT 16 17  ALKPHOS 125* 146*  BILITOT 0.4 0.3   PT/INR  Recent Labs  10/25/13 2137 10/26/13 1701  LABPROT 17.3* 20.8*  INR 1.45 1.85*    Studies/Results: Dg Chest Portable 1 View  10/26/2013   CLINICAL DATA:  RECTAL BLEEDING.  WEAKNESS.  EXAM: PORTABLE CHEST - 1 VIEW  COMPARISON:  05/22/2011  FINDINGS: Heart size and pulmonary vascularity are normal. There is chronic elevation of the right hemidiaphragm. There appear to be new small bilateral pleural effusions. No infiltrates. No acute osseous abnormality.  IMPRESSION: New small bilateral pleural effusions.   Electronically Signed   By: Rozetta Nunnery M.D.   On: 10/26/2013 17:06    Impression: 80 year old female admitted with progressive, painless hematochezia over the past several weeks in the setting of Xarelto secondary to newly diagnosed DVT early March 2015; Hgb with drop from 9/10 range to 8.1 this morning. However, she had a BM this morning without evidence of rectal bleeding. Supportive measures with Anusol suppositories have been started. Last colonoscopy in 2003 with left-sided transverse diverticula at that time. Drop in Hgb likely multifactorial; colonoscopy +/- EGD needed to further evaluate source of bleeding, as she will need to be on some form of anticoagulation in future due to DVT. Last dose of Xarelto on 3/15 in the morning. Anticipate colonoscopy +/- EGD on 3/17 with split prep.   Hypoalbuminemia: unknown etiology. Isolated mild elevation of Alk Phos: would monitor and recheck as outpatient.   Plan: Change diet to clear liquids Add Protonix each morning for GI prophylaxis Hold Xarelto Anticipate TCS+/-EGD on 3/17 to further evaluate source of lower GI bleeding  Agree with Anusol suppositories BID Further recommendations to follow.  Orvil Feil, ANP-BC [facility]     LOS: 1 day    10/27/2013,  9:57 AM   Attending note: Patient seen and examined. Agree with above assessment and recommendations.  Patient and daughter understand that Dr. Oneida Alar will be performing the endoscopic evaluation tomorrow in my absence.  The risks, benefits, limitations, imponderables and alternatives regarding both EGD and colonoscopy have been reviewed. Questions have been answered. All parties agreeable.

## 2013-10-27 NOTE — Telephone Encounter (Signed)
Received call from Holly Sloan, patient's daughter, with a question about patient's cast. She states patient went to the ED on Friday and several times over the weekend. She is now admitted in [facility] with another problem, however, she states they removed the cast and applied a splint. She is concerned about her not having a cast on, and wants to know if you would see her while she is in the hospital. Please advise. [phone]

## 2013-10-27 NOTE — Progress Notes (Signed)
UR completed 

## 2013-10-27 NOTE — Progress Notes (Signed)
[facility] PROGRESS NOTE  Holly Sloan WUJ:[id number] DOB: May 04, 1929 DOA: 10/26/2013 PCP: Delphina Cahill, MD  Assessment/Plan: 1. Rectal bleeding. Patient has a history of hemorrhoids and was currently on anticoagulation. She has been seen by gastroenterology and will undergo endoscopic evaluation tomorrow. Continue supportive management. She's been started on Anusol suppositories. 2. Acute blood loss anemia. Last hemoglobin is 8.1. We'll recheck this evening as well as in the morning. If hemoglobin continues to decline, she'll need transfusion of PRBCs. 3. Recent DVT. Patient's son had a recent left lower extremity DVT. She's been taking Xarelto which is currently on hold. We'll need to readdress this when she's had her endoscopic evaluation and safety to resume this 4. Cholecystitis or right radius. Patient recently had fracture of right radius. Arm is in splint. 5. Hypoalbuminemia. Outpatient workup underway with 24-hour urine protein. She is following up with oncology clinic for this.  Code Status: Full code Family Communication: Discussed with patient and family at the bedside Disposition Plan: Skilled nursing facility placement   Consultants:  Gastroenterology  Procedures:    Antibiotics:    HPI/Subjective: No new complaints.  Objective: Filed Vitals:   10/27/13 1433  BP: 139/58  Pulse: 93  Temp: 99.5 F (37.5 C)  Resp: 18    Intake/Output Summary (Last 24 hours) at 10/27/13 1927 Last data filed at 10/27/13 1856  Gross per 24 hour  Intake    720 ml  Output      3 ml  Net    717 ml   Filed Weights   10/26/13 1631  Weight: 75.751 kg (167 lb)    Exam:   General:  No acute distress  Cardiovascular: S1, S2, regular rate and rhythm  Respiratory: Clear to auscultation bilaterally  Abdomen: Soft, nontender, positive bowel sounds  Musculoskeletal: No pedal edema bilaterally   Data Reviewed: Basic Metabolic Panel:  Recent Labs Lab  10/24/13 1859 10/25/13 2137 10/26/13 1701 10/27/13 0513  NA 140 139 137 141  K 3.9 4.4 4.1 4.1  CL 105 105 101 110  CO2 27 28 25 26   GLUCOSE 107* 106* 103* 96  BUN 38* 38* 36* 32*  CREATININE 0.93 1.01 1.05 1.00  CALCIUM 9.5 9.3 9.4 8.5   Liver Function Tests:  Recent Labs Lab 10/24/13 1859 10/26/13 1701  AST 19 22  ALT 16 17  ALKPHOS 125* 146*  BILITOT 0.4 0.3  PROT 4.4* 4.5*  ALBUMIN 1.8* 1.8*   No results found for this basename: LIPASE, AMYLASE,  in the last 168 hours No results found for this basename: AMMONIA,  in the last 168 hours CBC:  Recent Labs Lab 10/24/13 1859 10/25/13 2137 10/26/13 1701 10/27/13 0513  WBC 8.9 7.6 8.3 6.9  NEUTROABS 6.2 4.6 5.3  --   HGB 9.9* 9.9* 10.0* 8.1*  HCT 29.3* 29.4* 30.0* 25.1*  MCV 88.5 89.4 89.8 90.3  PLT 382 363 453* 348   Cardiac Enzymes:  Recent Labs Lab 10/26/13 1701  TROPONINI <0.30   BNP (last 3 results)  Recent Labs  10/26/13 1701  PROBNP 672.3*   CBG: No results found for this basename: GLUCAP,  in the last 168 hours  No results found for this or any previous visit (from the past 240 hour(s)).   Studies: Dg Chest Portable 1 View  10/26/2013   CLINICAL DATA:  RECTAL BLEEDING.  WEAKNESS.  EXAM: PORTABLE CHEST - 1 VIEW  COMPARISON:  05/22/2011  FINDINGS: Heart size and pulmonary vascularity are normal. There is chronic elevation  of the right hemidiaphragm. There appear to be new small bilateral pleural effusions. No infiltrates. No acute osseous abnormality.  IMPRESSION: New small bilateral pleural effusions.   Electronically Signed   By: Rozetta Nunnery M.D.   On: 10/26/2013 17:06    Scheduled Meds: . amLODipine  2.5 mg Oral Daily  . feeding supplement (PRO-STAT SUGAR FREE 64)  30 mL Oral QID  . furosemide  40 mg Oral QPM  . furosemide  80 mg Oral Daily  . gabapentin  300 mg Oral TID  . hydrocortisone  25 mg Rectal BID  . levothyroxine  25 mcg Oral QAC breakfast  . pantoprazole  40 mg Oral Daily   . [START ON 10/28/2013] polyethylene glycol-electrolytes  2,000 mL Oral Once  . sodium chloride  3 mL Intravenous Q12H  . spironolactone  25 mg Oral Daily   Continuous Infusions:   Principal Problem:   Rectal bleeding Active Problems:   HYPERTENSION, PULMONARY   DVT   Colles' fracture of right radius   Hypoalbuminemia   Chronic anemia   GIB (gastrointestinal bleeding)    Time spent: 74mins    MEMON,JEHANZEB  [facility] Pager [phone]. If 7PM-7AM, please contact night-coverage at [email or web address], password [facility] 10/27/2013, 7:27 PM  LOS: 1 day

## 2013-10-27 NOTE — Care Management Note (Addendum)
    Page 1 of 1   10/30/2013     11:38:15 AM   CARE MANAGEMENT NOTE 10/30/2013  Patient:  Holly Sloan   Account Number:  [id number]  Date Initiated:  10/27/2013  Documentation initiated by:  Theophilus Kinds  Subjective/Objective Assessment:   Pt admitted from home with gi bleed. Pt lives with her daughter who is in and out of the home. Pt had been fairly independent with ADL's prior to a fall that resulted in a wrist fracture. Pts family interested in     Action/Plan:   in placement. PT consult ordered. Pt and pts family aware of placement process. Will alert CSW to possible placement need. IF pt returns home at discharge, will arrange [facility] services.   Anticipated DC Date:  10/29/2013   Anticipated DC Plan:  SKILLED NURSING FACILITY  In-house referral  Clinical Social Worker      DC Planning Services  CM consult      Choice offered to / List presented to:             Status of service:  Completed, signed off Medicare Important Message given?   (If response is "NO", the following Medicare IM given date fields will be blank) Date Medicare IM given:   Date Additional Medicare IM given:    Discharge Disposition:  [place]  Per UR Regulation:    If discussed at Long Length of Stay Meetings, dates discussed:    Comments:  10/30/13 [place], RN BSN CM Pt discharged to [facility] today. No CM needs noted. CSW to arrange discharge to facility.  10/27/13 [place], RN BSN CM
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
## 2013-10-28 ENCOUNTER — Encounter (HOSPITAL_COMMUNITY): Payer: Self-pay | Admitting: *Deleted

## 2013-10-28 ENCOUNTER — Encounter (HOSPITAL_COMMUNITY)
Admission: EM | Disposition: A | Discharge status: Skilled Nursing Facility | Payer: Self-pay | Source: Home / Self Care | Attending: Emergency Medicine

## 2013-10-28 HISTORY — PX: COLONOSCOPY WITH ESOPHAGOGASTRODUODENOSCOPY (EGD): SHX5779

## 2013-10-28 LAB — CBC
HCT: 28 % — ABNORMAL LOW (ref 36.0–46.0)
Hemoglobin: 9 g/dL — ABNORMAL LOW (ref 12.0–15.0)
MCH: 29.5 pg (ref 26.0–34.0)
MCHC: 32.1 g/dL (ref 30.0–36.0)
MCV: 91.8 fL (ref 78.0–100.0)
PLATELETS: 345 10*3/uL (ref 150–400)
RBC: 3.05 MIL/uL — AB (ref 3.87–5.11)
RDW: 16.2 % — ABNORMAL HIGH (ref 11.5–15.5)
WBC: 7 10*3/uL (ref 4.0–10.5)

## 2013-10-28 LAB — BASIC METABOLIC PANEL
BUN: 31 mg/dL — ABNORMAL HIGH (ref 6–23)
CO2: 29 meq/L (ref 19–32)
CREATININE: 0.91 mg/dL (ref 0.50–1.10)
Calcium: 8.6 mg/dL (ref 8.4–10.5)
Chloride: 110 mEq/L (ref 96–112)
GFR calc Af Amer: 66 mL/min — ABNORMAL LOW (ref >90)
GFR calc non Af Amer: 57 mL/min — ABNORMAL LOW (ref >90)
GLUCOSE: 84 mg/dL (ref 70–99)
Potassium: 3.7 mEq/L (ref 3.7–5.3)
Sodium: 145 mEq/L (ref 137–147)

## 2013-10-28 SURGERY — COLONOSCOPY WITH ESOPHAGOGASTRODUODENOSCOPY (EGD)
Anesthesia: Moderate Sedation

## 2013-10-28 MED ORDER — MEPERIDINE HCL 100 MG/ML IJ SOLN
INTRAMUSCULAR | Status: AC
  Filled 2013-10-28: qty 2

## 2013-10-28 MED ORDER — MINERAL OIL PO OIL
TOPICAL_OIL | ORAL | Status: AC
  Filled 2013-10-28: qty 30

## 2013-10-28 MED ORDER — LIDOCAINE VISCOUS 2 % MT SOLN
OROMUCOSAL | Status: DC | PRN
  Administered 2013-10-28: 1 via OROMUCOSAL

## 2013-10-28 MED ORDER — PANTOPRAZOLE SODIUM 40 MG PO TBEC
40.0000 mg | DELAYED_RELEASE_TABLET | Freq: Two times a day (BID) | ORAL | Status: DC
  Administered 2013-10-28 – 2013-10-30 (×4): 40 mg via ORAL
  Filled 2013-10-28 (×4): qty 1

## 2013-10-28 MED ORDER — MIDAZOLAM HCL 5 MG/5ML IJ SOLN
INTRAMUSCULAR | Status: DC | PRN
  Administered 2013-10-28 (×5): 1 mg via INTRAVENOUS

## 2013-10-28 MED ORDER — MIDAZOLAM HCL 5 MG/5ML IJ SOLN
INTRAMUSCULAR | Status: AC
  Filled 2013-10-28: qty 10

## 2013-10-28 MED ORDER — LIDOCAINE VISCOUS 2 % MT SOLN
OROMUCOSAL | Status: AC
  Filled 2013-10-28: qty 15

## 2013-10-28 MED ORDER — SODIUM CHLORIDE 0.9 % IV SOLN
INTRAVENOUS | Status: DC
  Administered 2013-10-28: 08:00:00 via INTRAVENOUS

## 2013-10-28 MED ORDER — MEPERIDINE HCL 100 MG/ML IJ SOLN
INTRAMUSCULAR | Status: DC | PRN
  Administered 2013-10-28 (×2): 25 mg via INTRAVENOUS

## 2013-10-28 MED ORDER — RIVAROXABAN 15 MG PO TABS
15.0000 mg | ORAL_TABLET | Freq: Two times a day (BID) | ORAL | Status: DC
  Administered 2013-10-28 – 2013-10-29 (×2): 15 mg via ORAL
  Filled 2013-10-28 (×2): qty 1

## 2013-10-28 NOTE — Progress Notes (Signed)
Physical Therapy Treatment Patient Details Name: Holly Sloan MRN: [id number] DOB: [birth year] Today's Date: 10/28/2013 Time: 1420-1440 PT Time Calculation (min): 20 min  PT Assessment / Plan / Recommendation  PT Comments   Pt is in bed with family friend at bedside at therapist entry. Pt refuses to stand or walk today, she states that her legs won't hold her. Pt educated on the benefits of getting out of bed. Pt is willing to complete LE strengthening exercises in the bed. Pt has difficulty with bed exercises due to muscle weakness and discomfort.   Plan Current plan remains appropriate    Precautions / Restrictions Restrictions Weight Bearing Restrictions: Yes RUE Weight Bearing: Non weight bearing   Pertinent Vitals/Pain No pain.    Exercises General Exercises - Lower Extremity Ankle Circles/Pumps: Both;10 reps;Supine Gluteal Sets: Both;10 reps;Supine Short Arc Quad: Both;10 reps;Supine Hip ABduction/ADduction: Both;5 reps;Supine     Visit Information  Last PT Received On: 10/28/13    Subjective Data  "I just can't walk. My legs won't hold me."  End of Session PT - End of Session Activity Tolerance: Patient limited by fatigue Patient left: with call bell/phone within reach;with family/visitor present;in bed   Rachelle Hora, PTA  10/28/2013, 2:50 PM

## 2013-10-28 NOTE — H&P (Signed)
Primary Care Physician:  Delphina Cahill, MD Primary Gastroenterologist:  Dr. Oneida Alar  Pre-Procedure History & Physical: HPI:  Holly Sloan is a 80 y.o. female here for  BRBPR.  Past Medical History  Diagnosis Date  . CHF (congestive heart failure)   . Hypertension   . Arthritis   . Kidney stone   . Pneumonia     20 years ago  . DVT (deep venous thrombosis) 10/14/13    left leg  . Fracture of wrist 10/18/13    right  . Hip fracture, left   . Deep vein blood clot of left lower extremity     Past Surgical History  Procedure Laterality Date  . Appendectomy      as teenager  . Kidney stone surgery  jan 2012    stone remopved left side  . Hip pinning,cannulated  05/23/2011    Procedure: CANNULATED HIP PINNING;  Surgeon: Arther Abbott, MD;  Location: [facility];  Service: Orthopedics;  Laterality: Left;  late entry: updated foley documentation  . Hip open reduction Left 2012  . Esophagogastroduodenoscopy  April 2003    Dr. Gala Romney: 2 polyps proximal stomach, moderate sized hiatal hernia  . Colonoscopy  April 2003    Dr. Gala Romney: left-sided transverse diverticula    Prior to Admission medications   Medication Sig Start Date End Date Taking? Authorizing Provider  Amino Acids-Protein Hydrolys (FEEDING SUPPLEMENT, PRO-STAT SUGAR FREE 64,) LIQD Take 30 mLs by mouth 4 (four) times daily.   Yes Historical Provider, MD  amLODipine (NORVASC) 2.5 MG tablet Take 2.5 mg by mouth daily.    Yes Historical Provider, MD  furosemide (LASIX) 40 MG tablet Take 40-80 mg by mouth 2 (two) times daily. Two tablets in the morning and one tablet in the evening For swelling   Yes Historical Provider, MD  gabapentin (NEURONTIN) 300 MG capsule Take 300 mg by mouth 3 (three) times daily.   Yes Historical Provider, MD  levothyroxine (SYNTHROID, LEVOTHROID) 25 MCG tablet Take 25 mcg by mouth every morning.  10/13/13  Yes Historical Provider, MD  Probiotic Product (PROBIOTIC DAILY PO) Take 1 capsule by mouth daily.   Yes  Historical Provider, MD  Rivaroxaban (XARELTO) 15 MG TABS tablet Take 15 mg by mouth 2 (two) times daily with a meal. Held sinceSUN  Yes Historical Provider, MD  spironolactone (ALDACTONE) 25 MG tablet Take 25 mg by mouth daily.   Yes Historical Provider, MD  traMADol (ULTRAM) 50 MG tablet Take 1 tablet (50 mg total) by mouth every 6 (six) hours as needed. 10/18/13  Yes Johnna Acosta, MD    Allergies as of 10/26/2013  . (No Known Allergies)    Family History  Problem Relation Age of Onset  . Anesthesia problems Neg Hx   . Hypotension Neg Hx   . Malignant hyperthermia Neg Hx   . Pseudochol deficiency Neg Hx   . Colon cancer Neg Hx     History   Social History  . Marital Status: Widowed    Spouse Name: N/A    Number of Children: N/A  . Years of Education: N/A   Occupational History  . Not on file.   Social History Main Topics  . Smoking status: Never Smoker   . Smokeless tobacco: Never Used  . Alcohol Use: No  . Drug Use: No  . Sexual Activity: Not Currently   Other Topics Concern  . Not on file   Social History Narrative  . No narrative on file  Review of Systems: See HPI, otherwise negative ROS   Physical Exam: BP 117/64  Pulse 73  Temp(Src) 97.3 F (36.3 C) (Axillary)  Resp 18  Ht 5\' 4"  (1.626 m)  Wt 167 lb (75.751 kg)  BMI 28.65 kg/m2  SpO2 98% General:   Alert,  pleasant and cooperative in NAD Head:  Normocephalic and atraumatic. Neck:  Supple; Lungs:  Clear throughout to auscultation.    Heart:  Regular rate and rhythm. Abdomen:  Soft, nontender and nondistended. Normal bowel sounds, without guarding, and without rebound.   Neurologic:  Alert and  oriented x4;  grossly normal neurologically.  Impression/Plan:     BRBPR  PLAN: TCS/?EGD TODAY REVIEWED XARELTO WITH PHARMACY. HOLD FOR 24 HRS FOR TCS.

## 2013-10-28 NOTE — Clinical Social Work Placement (Signed)
Clinical Social Work Department CLINICAL SOCIAL WORK PLACEMENT NOTE 10/28/2013  Patient:  Holly Sloan, Holly Sloan  Account Number:  [id number] Admit date:  10/26/2013  Clinical Social Worker:  Benay Pike, LCSW  Date/time:  10/28/2013 11:23 AM  Clinical Social Work is seeking post-discharge placement for this patient at the following level of care:   [place]   (*CSW will update this form in Epic as items are completed)   10/28/2013  Patient/family provided with [place] Department of Clinical Social Work's list of facilities offering this level of care within the geographic area requested by the patient (or if unable, by the patient's family).  10/28/2013  Patient/family informed of their freedom to choose among providers that offer the needed level of care, that participate in Medicare, Medicaid or managed care program needed by the patient, have an available bed and are willing to accept the patient.  10/28/2013  Patient/family informed of [facility]' ownership interest in [facility], as well as of the fact that they are under no obligation to receive care at this facility.  PASARR submitted to E[employer] on  PASARR number received from [facility] on   FL2 transmitted to all facilities in geographic area requested by pt/family on  10/28/2013 FL2 transmitted to all facilities within larger geographic area on   Patient informed that his/her managed care company has contracts with or will negotiate with  certain facilities, including the following:     Patient/family informed of bed offers received:   Patient chooses bed at  Physician recommends and patient chooses bed at    Patient to be transferred to  on   Patient to be transferred to facility by   The following physician request were entered in Epic:   Additional Comments: Pt has existing pasarr number.  Benay Pike, [place]

## 2013-10-28 NOTE — Op Note (Signed)
[facility] [address] [place], [address]   ENDOSCOPY PROCEDURE REPORT  PATIENT: Holly, Sloan  MR#: [id number] BIRTHDATE: June 16, 1929 , 64  yrs. old GENDER: Female  ENDOSCOPIST: Barney Drain, MD REFERRED CB:JSEG Hall, M.D.  PROCEDURE DATE: 10/28/2013 PROCEDURE:   EGD, diagnostic INDICATIONS:Rectal bleeding on XARELTO. POSSIBLE DYSPHAGIA. MEDICATIONS: TCS+ Versed 2 mg IV TOPICAL ANESTHETIC:   Viscous Xylocaine  DESCRIPTION OF PROCEDURE:     Physical exam was performed.  Informed consent was obtained from the patient after explaining the benefits, risks, and alternatives to the procedure.  The patient was connected to the monitor and placed in the left lateral position.  Continuous oxygen was provided by nasal cannula and IV medicine administered through an indwelling cannula.  After administration of sedation, the patients esophagus was intubated and the [id number] ([id number])  endoscope was advanced under direct visualization to the second portion of the duodenum.  The scope was removed slowly by carefully examining the color, texture, anatomy, and integrity of the mucosa on the way out.  The patient was recovered in endoscopy and discharged home in satisfactory condition.   ESOPHAGUS: The mucosa of the esophagus appeared normal.   UNABLE TO APPRECIATE STRICTURE.   A large hiatal hernia was noted.   STOMACH: Mild erosive gastritis (inflammation) was found in the gastric antrum and gastric body.  DUODENUM: MULTIPLE DIVERTICULA IN THE DUODENUM.  AMPULLA SITS IN MOST PROXIMAL DIVERTICULA.     NO old blood or fresh blood seen in UPPER GI TRACT COMPLICATIONS:   None  ENDOSCOPIC IMPRESSION: 1.   NORMAL ESOPHAGUS 2.   DYSPHAGIA MOST LIKELY DUE TO ESO MOTILITY DISORDER AND/OR INTERMITTENT FOOD BOLUS IN HIATAL HERNIA POUCH 3.   Large hiatal hernia 4.   MILD Erosive gastritis 5.   MODERATE DIVERTICULOSIS IN THE DUODENUM  RECOMMENDATIONS: SOFT MECHANICAL  DIET ANUSOL HC PR BID FOR 14 DAYS MAY RESTART XARELTO BENEFITS OUTWEIGH THE RISKS.  MONITOR FOR BLEEDING. PROTONIX BID CHECK H PYLORI SEROLOGY.   REPEAT EXAM:   _______________________________ Lorrin MaisBarney Drain, MD 10/28/2013 10:43 AM       PATIENT NAME:  Holly, Sloan MR#: [id number]

## 2013-10-28 NOTE — Progress Notes (Signed)
Patient received tap water enema X2 per MD order @ 0500. Patient tolerated the procedure well. Patient is now having watery bowel movements that are tan in color but not clear. Patient is continuing the remainder of the Golytely at this time. Will encourage the patient to continue drinking the Golytely and continue to monitor the patient at this time.

## 2013-10-28 NOTE — Progress Notes (Signed)
[facility] PROGRESS NOTE  Holly Sloan BMW:[id number] DOB: 1928/08/25 DOA: 10/26/2013 PCP: Delphina Cahill, MD  Assessment/Plan: 1. Rectal bleeding. Patient has a history of hemorrhoids and was currently on anticoagulation. She has undergone endoscopic evaluation and bleeding appears to be coming from internal hemorrhoids. We'll continue with Anusol suppositories. Her bleeding appears to have resolved at this time. Continue to follow. Restart anticoagulation and monitor for recurrence of bleeding.. 2. Acute blood loss anemia. Appears to be stable.  Continue to follow hemoglobin and transfuse as necessary. Baseline hemoglobin is not clear since last lab values in our system are from 2012 and Hgb noted to be 12.  Most recent value is 9 and is stable. 3. Recent LLE DVT diagnosed on 10/15/13. Patient was found to have a recent left lower extremity DVT. She's been taking Xarelto which is currently on hold.  Her bleeding appears to be hemorrhoidal in nature and seems to have stopped.  Will restart on xarelto and observe for any recurrent bleeding. 4. Cholecystitis or right radius. Patient recently had fracture of right radius. Arm is in splint. Follow up with orthopedicsw 5. Hypoalbuminemia. Outpatient workup underway with 24-hour urine protein. She is following up with oncology clinic regarding this matter.  Code Status: Full code Family Communication: Discussed with patient and family at the bedside Disposition Plan: Skilled nursing facility placement   Consultants:  Gastroenterology  Procedures: Endoscopy 3/17: 1. NORMAL ESOPHAGUS  2. DYSPHAGIA MOST LIKELY DUE TO ESO MOTILITY DISORDER AND/OR  INTERMITTENT FOOD BOLUS IN HIATAL HERNIA POUCH  3. Large hiatal hernia  4. MILD Erosive gastritis  5. MODERATE DIVERTICULOSIS IN THE DUODENUM  Colonoscopy 3/17:1. Normal mucosa in the terminal ileum  2. The colon IS redundant  3. Moderate diverticulosis in the descending and sigmoid colon  4.  RECTAL BLEEDING DUE TO Large internal hemorrhoids   Antibiotics:    HPI/Subjective: No new complaints. No further bleeding  Objective: Filed Vitals:   10/28/13 1356  BP: 118/74  Pulse: 71  Temp: 97.4 F (36.3 C)  Resp: 18    Intake/Output Summary (Last 24 hours) at 10/28/13 1911 Last data filed at 10/28/13 1900  Gross per 24 hour  Intake   1343 ml  Output      5 ml  Net   1338 ml   Filed Weights   10/26/13 1631  Weight: 75.751 kg (167 lb)    Exam:   General:  No acute distress  Cardiovascular: S1, S2, regular rate and rhythm  Respiratory: Clear to auscultation bilaterally  Abdomen: Soft, nontender, positive bowel sounds  Musculoskeletal: 1+pedal edema bilaterally   Data Reviewed: Basic Metabolic Panel:  Recent Labs Lab 10/24/13 1859 10/25/13 2137 10/26/13 1701 10/27/13 0513 10/28/13 0527  NA 140 139 137 141 145  K 3.9 4.4 4.1 4.1 3.7  CL 105 105 101 110 110  CO2 27 28 25 26 29   GLUCOSE 107* 106* 103* 96 84  BUN 38* 38* 36* 32* 31*  CREATININE 0.93 1.01 1.05 1.00 0.91  CALCIUM 9.5 9.3 9.4 8.5 8.6   Liver Function Tests:  Recent Labs Lab 10/24/13 1859 10/26/13 1701  AST 19 22  ALT 16 17  ALKPHOS 125* 146*  BILITOT 0.4 0.3  PROT 4.4* 4.5*  ALBUMIN 1.8* 1.8*   No results found for this basename: LIPASE, AMYLASE,  in the last 168 hours No results found for this basename: AMMONIA,  in the last 168 hours CBC:  Recent Labs Lab 10/24/13 1859 10/25/13 2137 10/26/13 1701 10/27/13  3716 10/27/13 1951 10/28/13 0527  WBC 8.9 7.6 8.3 6.9 7.8 7.0  NEUTROABS 6.2 4.6 5.3  --   --   --   HGB 9.9* 9.9* 10.0* 8.1* 9.5* 9.0*  HCT 29.3* 29.4* 30.0* 25.1* 29.1* 28.0*  MCV 88.5 89.4 89.8 90.3 91.8 91.8  PLT 382 363 453* 348 393 345   Cardiac Enzymes:  Recent Labs Lab 10/26/13 1701  TROPONINI <0.30   BNP (last 3 results)  Recent Labs  10/26/13 1701  PROBNP 672.3*   CBG: No results found for this basename: GLUCAP,  in the last 168  hours  No results found for this or any previous visit (from the past 240 hour(s)).   Studies: No results found.  Scheduled Meds: . amLODipine  2.5 mg Oral Daily  . feeding supplement (PRO-STAT SUGAR FREE 64)  30 mL Oral QID  . furosemide  40 mg Oral QPM  . furosemide  80 mg Oral Daily  . gabapentin  300 mg Oral TID  . hydrocortisone  25 mg Rectal BID  . levothyroxine  25 mcg Oral QAC breakfast  . pantoprazole  40 mg Oral BID AC  . sodium chloride  3 mL Intravenous Q12H  . spironolactone  25 mg Oral Daily   Continuous Infusions:   Principal Problem:   Rectal bleeding Active Problems:   HYPERTENSION, PULMONARY   DVT   Colles' fracture of right radius   Hypoalbuminemia   Chronic anemia   GIB (gastrointestinal bleeding)   Acute blood loss anemia    Time spent: 40mins    Tayanna Talford  [facility] Pager [phone]. If 7PM-7AM, please contact night-coverage at [email or web address], password [facility] 10/28/2013, 7:11 PM  LOS: 2 days

## 2013-10-28 NOTE — Progress Notes (Signed)
ANTICOAGULATION CONSULT NOTE - Initial Consult  Pharmacy Consult for Xarelto Indication: DVT  No Known Allergies  Patient Measurements: Height: 5\' 4"  (162.6 cm) Weight: 167 lb (75.751 kg) IBW/kg (Calculated) : 54.7 Heparin Dosing Weight:   Vital Signs: Temp: 97.4 F (36.3 C) (03/17 1356) Temp src: Oral (03/17 1356) BP: 118/74 mmHg (03/17 1356) Pulse Rate: 71 (03/17 1356)  Labs:  Recent Labs  10/25/13 2137 10/26/13 1701 10/27/13 0513 10/27/13 1951 10/28/13 0527  HGB 9.9* 10.0* 8.1* 9.5* 9.0*  HCT 29.4* 30.0* 25.1* 29.1* 28.0*  PLT 363 453* 348 393 345  LABPROT 17.3* 20.8*  --   --   --   INR 1.45 1.85*  --   --   --   CREATININE 1.01 1.05 1.00  --  0.91  TROPONINI  --  <0.30  --   --   --     Estimated Creatinine Clearance: 47.5 ml/min (by C-G formula based on Cr of 0.91).   Medical History: Past Medical History  Diagnosis Date  . CHF (congestive heart failure)   . Hypertension   . Arthritis   . Kidney stone   . Pneumonia     20 years ago  . DVT (deep venous thrombosis) 10/14/13    left leg  . Fracture of wrist 10/18/13    right  . Hip fracture, left   . Deep vein blood clot of left lower extremity     Medications:  Scheduled:  . amLODipine  2.5 mg Oral Daily  . feeding supplement (PRO-STAT SUGAR FREE 64)  30 mL Oral QID  . furosemide  40 mg Oral QPM  . furosemide  80 mg Oral Daily  . gabapentin  300 mg Oral TID  . hydrocortisone  25 mg Rectal BID  . levothyroxine  25 mcg Oral QAC breakfast  . pantoprazole  40 mg Oral BID AC  . Rivaroxaban  15 mg Oral BID WC  . sodium chloride  3 mL Intravenous Q12H  . spironolactone  25 mg Oral Daily    Assessment: Patient diagnosed 10/15/2013 LLE DVT, started on Xarelto 15 mg po bid Patient admitted 10/26/2013 rectal bleeding, Xarelto placed on hold. Last PTA dose given 10/26/2013. Endoscopic evaluation, internal hemorrhoids, bleeding seems stopped. Restart Xarelto and observe for recurrent bleeding  Goal of  Therapy:  DVT treatment Monitor platelets by anticoagulation protocol: Yes   Plan:  Restart Xarelto 15 mg po bid F/U initial Xarelto initial start date, date to change to 20 mg po daily F/U bleeding Labs per protocol  Holly Sloan, Holly Sloan 10/28/2013,8:21 PM

## 2013-10-28 NOTE — Op Note (Signed)
[facility] [address] [place], [address]   COLONOSCOPY PROCEDURE REPORT  PATIENT: Holly Sloan, Holly Sloan  MR#: [id number] BIRTHDATE: 29-Mar-1929 , 59  yrs. old GENDER: Female ENDOSCOPIST: Barney Drain, MD REFERRED OI:NOMV Hall, M.D. PROCEDURE DATE:  10/28/2013 PROCEDURE:   Colonoscopy, diagnostic INDICATIONS:Rectal Bleeding on XARELTO. MEDICATIONS: Demerol 50 mg IV and Versed 3 mg IV  DESCRIPTION OF PROCEDURE:    Physical exam was performed.  Informed consent was obtained from the patient after explaining the benefits, risks, and alternatives to procedure.  The patient was connected to monitor and placed in left lateral position. Continuous oxygen was provided by nasal cannula and IV medicine administered through an indwelling cannula.  After administration of sedation and rectal exam, the patients rectum was intubated and the EC-[id number] ([id number])  colonoscope was advanced under direct visualization to the ileum.  The scope was removed slowly by carefully examining the color, texture, anatomy, and integrity mucosa on the way out.  The patient was recovered in endoscopy and discharged home in satisfactory condition.    COLON FINDINGS: The mucosa appeared normal in the terminal ileum. The LEFT colon IS redundant.  The patient was moved on to their back to reach the cecum.  Manual abdominal counter-pressure was used to reach the cecum. There was moderate diverticulosis noted in the descending colon and sigmoid colon with associated muscular hypertrophy and tortuosity.  Large internal hemorrhoids were found. NO OLD BLOOD OR FRESH BLOOD SEEN IN COLON OR ILEUM.  PREP QUALITY: good.  CECAL W/D TIME: 14 minutes     COMPLICATIONS: None  ENDOSCOPIC IMPRESSION: 1.   Normal mucosa in the terminal ileum 2.   The colon IS redundant 3.   Moderate diverticulosis in the descending and sigmoid colon 4.   RECTAL BLEEDING DUE TO Large internal  hemorrhoids  RECOMMENDATIONS: SOFT MECHANICAL DIET ANUSOL HC PR BID FOR 14 DAYS MAY RESTART XARELTO BENEFITS OUTWEIGH THE RISKS.  MONITOR FOR BLEEDING. PROTONIX BID CHECK H PYLORI SEROLOGY. NEXT COLONOSCOPY WITH ENTRADA OVERTUBE.       _______________________________ Lorrin MaisBarney Drain, MD 10/28/2013 10:33 AM

## 2013-10-28 NOTE — Clinical Social Work Psychosocial (Signed)
Clinical Social Work Department BRIEF PSYCHOSOCIAL ASSESSMENT 10/28/2013  Patient:  Holly Sloan, Holly Sloan     Account Number:  [id number]     Admit date:  10/26/2013  Clinical Social Worker:  Wyatt Haste  Date/Time:  10/28/2013 11:25 AM  Referred by:  CSW  Date Referred:  10/28/2013 Referred for  SNF Placement   Other Referral:   Interview type:  Patient Other interview type:   daughter- Tye [place]    PSYCHOSOCIAL DATA Living Status:  FAMILY Admitted from facility:   Level of care:   Primary support name:  Pat Primary support relationship to patient:  CHILD, ADULT Degree of support available:   supportive    CURRENT CONCERNS Current Concerns  Post-Acute Placement   Other Concerns:    SOCIAL WORK ASSESSMENT / PLAN CSW met with pt and pt's daughter [name] at bedside. Pt alert and oriented and reports she lives with her daughter Fraser Din. Pt has 3 daughters who all live locally and appear to be very involved and supportive. Pt came to ED with worsening rectal bleeding. She had colonoscopy this morning. Reports that last week she fell and fractured her wrist. Prior to this pt was ambulating with a walker, although her daughter states it had become more difficult. Since her wrist fracture, pt has been requiring much more assist. Evaluated by PT yesterday afternoon and recommendation is for SNF. CSW discussed placement process and provided SNF list. Pt has been to [facility] several years ago after a hip fracture. Pt reports it has been difficult adjusting to relying on others for help as she is generally very independent. CSW encouraged pt that rehab will hopefully improve pt's mobility and independence. Pt requesting SNF in [place].   Assessment/plan status:  Psychosocial Support/Ongoing Assessment of Needs Other assessment/ plan:   Information/referral to community resources:   SNF list    PATIENT'S/FAMILY'S RESPONSE TO PLAN OF CARE: Pt and daughter open to SNF. CSW initiated bed search and  will follow up with offers when available.       Benay Pike, [place]

## 2013-10-29 ENCOUNTER — Encounter (HOSPITAL_COMMUNITY): Payer: Self-pay | Admitting: Internal Medicine

## 2013-10-29 DIAGNOSIS — K649 Unspecified hemorrhoids: Secondary | ICD-10-CM | POA: Diagnosis present

## 2013-10-29 DIAGNOSIS — K2960 Other gastritis without bleeding: Secondary | ICD-10-CM | POA: Diagnosis present

## 2013-10-29 DIAGNOSIS — K449 Diaphragmatic hernia without obstruction or gangrene: Secondary | ICD-10-CM | POA: Diagnosis present

## 2013-10-29 LAB — CBC
HEMATOCRIT: 25.6 % — AB (ref 36.0–46.0)
Hemoglobin: 8.3 g/dL — ABNORMAL LOW (ref 12.0–15.0)
MCH: 29.7 pg (ref 26.0–34.0)
MCHC: 32.4 g/dL (ref 30.0–36.0)
MCV: 91.8 fL (ref 78.0–100.0)
Platelets: 307 10*3/uL (ref 150–400)
RBC: 2.79 MIL/uL — AB (ref 3.87–5.11)
RDW: 15.9 % — ABNORMAL HIGH (ref 11.5–15.5)
WBC: 6.1 10*3/uL (ref 4.0–10.5)

## 2013-10-29 MED ORDER — RIVAROXABAN 15 MG PO TABS
15.0000 mg | ORAL_TABLET | Freq: Two times a day (BID) | ORAL | Status: DC
  Administered 2013-10-29 – 2013-10-30 (×2): 15 mg via ORAL
  Filled 2013-10-29 (×2): qty 1

## 2013-10-29 MED ORDER — RIVAROXABAN 20 MG PO TABS: 20.0000 mg | ORAL_TABLET | Freq: Every day | ORAL | Status: DC

## 2013-10-29 NOTE — Progress Notes (Addendum)
Physical Therapy Treatment Patient Details Name: Holly Sloan MRN: [id number] DOB: [birth year] Today's Date: 10/29/2013 Time: 7782-4235 PT Time Calculation (min): 31 min Charges: Therapeutic Exercise 51min  PT Assessment / Plan / Recommendation  History of Present Illness Pt is admitted from home due to a GI bleed.  She has numerous health problems including a recent LLE DV, CHF, right wrist fx s/p 1 week.  She lives with her daughter who reports that pt sustained a left hip fx with OTIF 3 years ago.  She became ambulatory with a walker following this fx but became very fearful of falling and therefore is extremely sedentary.  Pt has severe DJD with crepitus in the right knee and severe thoracic kyphosis.  She had been fairly independent with ADLs prior to recent wrist fx.   PT Comments   Patient continues to state that she does not want to get out of bed because her left though is hurting so bad and she doesn't think it will hold her "My Left leg just cant hold me and I'm too weak to lift myself up with my arms." Patient was educated on importance of walking and moving for prevention of future illness. Patient continued to decline walking despite max cuing. Patient performed bed exercises with good tolerance though patient stated moderate to severe pain with Lt LE supine straight leg raise, bridge, and hip abduction. Nurse was notified of patient's c/o pain and decreased aptitude for walking.   Follow Up Recommendations  SNF                 Frequency Min 3X/week      Plan Current plan remains appropriate    Precautions / Restrictions Restrictions Weight Bearing Restrictions: Yes RUE Weight Bearing: Non weight bearing   Pertinent Vitals/Pain Pain 6/10       Exercises General Exercises - Lower Extremity Ankle Circles/Pumps: Both;10 reps;Supine Gluteal Sets: Both;10 reps;Supine Heel Slides: AROM;Both;10 reps;Supine Hip ABduction/ADduction: Both;Supine;10 reps Straight Leg  Raises: AROM;10 reps;Both;Supine Other Exercises Other Exercises: Bridges 10x hooklying     PT Goals (current goals can now be found in the care plan section)   Visit Information  Last PT Received On: 10/29/13 Assistance Needed: +1 History of Present Illness: Pt is admitted from home due to a GI bleed.  She has numerous health problems including a recent LLE DV, CHF, right wrist fx s/p 1 week.  She lives with her daughter who reports that pt sustained a left hip fx with OTIF 3 years ago.  She became ambulatory with a walker following this fx but became very fearful of falling and therefore is extremely sedentary.  Pt has severe DJD with crepitus in the right knee and severe thoracic kyphosis.  She had been fairly independent with ADLs prior to recent wrist fx.             End of Session PT - End of Session Activity Tolerance: Patient limited by fatigue;Patient limited by pain Patient left: with call bell/phone within reach;with family/visitor present;in bed Nurse Communication: Mobility status   GP     Zi Newbury R PT, DPT 10/29/2013, 11:17 AM

## 2013-10-29 NOTE — Clinical Social Work Note (Signed)
CSW presented bed offers at [facility] and [facility] and pt requests private room. Called both facilities and neither have private room available. Pt notified and chooses semi-private at [facility]. Facility aware as well as Ria Comment with [facility] who will fax authorization to [facility] today. Awaiting stability for d/c.   Benay Pike, [place]

## 2013-10-29 NOTE — Progress Notes (Signed)
REVIEWED.  

## 2013-10-29 NOTE — Progress Notes (Signed)
    Subjective: No rectal bleeding, abdominal pain, N/V. Waiting for breakfast.   Objective: Vital signs in last 24 hours: Temp:  [97.3 F (36.3 C)-98.2 F (36.8 C)] 97.6 F (36.4 C) (03/18 0500) Pulse Rate:  [66-99] 67 (03/18 0500) Resp:  [10-20] 11 (03/18 0500) BP: (70-122)/(37-95) 108/51 mmHg (03/18 0500) SpO2:  [94 %-100 %] 95 % (03/18 0500) Last BM Date: 10/28/13 General:   Alert and oriented, pleasant Head:  Normocephalic and atraumatic. Heart:  S1, S2 present, no murmurs noted.  Lungs: Clear to auscultation bilaterally, without wheezing, rales, or rhonchi.  Abdomen:  Bowel sounds present, soft, non-tender, non-distended. No HSM or hernias noted. Extremities:  Without clubbing or edema. Neurologic:  Alert and  oriented x4;  grossly normal neurologically. Psych:  Alert and cooperative. Normal mood and affect.  Intake/Output from previous day: 03/17 0701 - 03/18 0700 In: 1340 [P.O.:440; I.V.:900] Out: -  Intake/Output this shift:    Lab Results:  Recent Labs  10/27/13 1951 10/28/13 0527 10/29/13 0508  WBC 7.8 7.0 6.1  HGB 9.5* 9.0* 8.3*  HCT 29.1* 28.0* 25.6*  PLT 393 345 307   BMET  Recent Labs  10/26/13 1701 10/27/13 0513 10/28/13 0527  NA 137 141 145  K 4.1 4.1 3.7  CL 101 110 110  CO2 $Re'25 26 29  'thO$ GLUCOSE 103* 96 84  BUN 36* 32* 31*  CREATININE 1.05 1.00 0.91  CALCIUM 9.4 8.5 8.6   LFT  Recent Labs  10/26/13 1701  PROT 4.5*  ALBUMIN 1.8*  AST 22  ALT 17  ALKPHOS 146*  BILITOT 0.3   PT/INR  Recent Labs  10/26/13 1701  LABPROT 20.8*  INR 1.85*    Assessment: 80 year old female with rectal bleeding in setting of Xarelto secondary to large internal hemorrhoids noted on colonoscopy. EGD with normal esophagus, mild erosive gastritis. Dysphagia likely secondary to underlying motility disorder +/- large hiatal hernia.   Isolated mild elevation of alk phos : recheck as outpatient.    Plan: Anusol BID for 2 weeks Protonix  BID H.pylori serology pending Hopeful discharge soon; stable from GI standpoint. Will follow peripherally.  Orvil Feil, ANP-BC [facility]       LOS: 3 days    10/29/2013, 7:59 AM

## 2013-10-29 NOTE — Clinical Social Work Placement (Signed)
Clinical Social Work Department CLINICAL SOCIAL WORK PLACEMENT NOTE 10/29/2013  Patient:  Holly Sloan, Holly Sloan  Account Number:  [id number] Admit date:  10/26/2013  Clinical Social Worker:  Benay Pike, LCSW  Date/time:  10/28/2013 11:23 AM  Clinical Social Work is seeking post-discharge placement for this patient at the following level of care:   [place]   (*CSW will update this form in Epic as items are completed)   10/28/2013  Patient/family provided with [place] Department of Clinical Social Work's list of facilities offering this level of care within the geographic area requested by the patient (or if unable, by the patient's family).  10/28/2013  Patient/family informed of their freedom to choose among providers that offer the needed level of care, that participate in Medicare, Medicaid or managed care program needed by the patient, have an available bed and are willing to accept the patient.  10/28/2013  Patient/family informed of [facility]' ownership interest in [facility], as well as of the fact that they are under no obligation to receive care at this facility.  PASARR submitted to EDS on  PASARR number received from [place] on   FL2 transmitted to all facilities in geographic area requested by pt/family on  10/28/2013 FL2 transmitted to all facilities within larger geographic area on   Patient informed that his/her managed care company has contracts with or will negotiate with  certain facilities, including the following:     Patient/family informed of bed offers received:  10/29/2013 Patient chooses bed at [place] Physician recommends and patient chooses bed at  [facility]  Patient to be transferred to  on   Patient to be transferred to facility by   The following physician request were entered in Epic:   Additional Comments: Pt has existing pasarr number.  Benay Pike, [place]

## 2013-10-29 NOTE — Progress Notes (Signed)
[place] for Xarelto Indication: DVT  No Known Allergies  Patient Measurements: Height: 5\' 4"  (162.6 cm) Weight: 167 lb (75.751 kg) IBW/kg (Calculated) : 54.7  Vital Signs: Temp: 97.6 F (36.4 C) (03/18 0500) Temp src: Oral (03/18 0500) BP: 118/47 mmHg (03/18 0915) Pulse Rate: 67 (03/18 0500)  Labs:  Recent Labs  10/26/13 1701 10/27/13 0513 10/27/13 1951 10/28/13 0527 10/29/13 0508  HGB 10.0* 8.1* 9.5* 9.0* 8.3*  HCT 30.0* 25.1* 29.1* 28.0* 25.6*  PLT 453* 348 393 345 307  LABPROT 20.8*  --   --   --   --   INR 1.85*  --   --   --   --   CREATININE 1.05 1.00  --  0.91  --   TROPONINI <0.30  --   --   --   --     Estimated Creatinine Clearance: 47.5 ml/min (by C-G formula based on Cr of 0.91).   Medical History: Past Medical History  Diagnosis Date  . CHF (congestive heart failure)   . Hypertension   . Arthritis   . Kidney stone   . Pneumonia     20 years ago  . DVT (deep venous thrombosis) 10/14/13    left leg  . Fracture of wrist 10/18/13    right  . Hip fracture, left   . Deep vein blood clot of left lower extremity     Medications:  Scheduled:  . amLODipine  2.5 mg Oral Daily  . feeding supplement (PRO-STAT SUGAR FREE 64)  30 mL Oral QID  . furosemide  40 mg Oral QPM  . furosemide  80 mg Oral Daily  . gabapentin  300 mg Oral TID  . hydrocortisone  25 mg Rectal BID  . levothyroxine  25 mcg Oral QAC breakfast  . pantoprazole  40 mg Oral BID AC  . rivaroxaban  15 mg Oral BID WC   Followed by  . [START ON 11/05/2013] rivaroxaban  20 mg Oral Q supper  . sodium chloride  3 mL Intravenous Q12H  . spironolactone  25 mg Oral Daily    Assessment: Patient diagnosed with LLE DVT on 10/15/13.  She started on Xarelto 15 mg po bid that evening.  She currently has medication samples from Dr Olivia Canter office.   She was admitted to [facility] on 10/26/2013 with rectal bleeding due to hemorrhoids in setting of anticoagulation.  Last  dose given 10/26/2013. Plan to restart Xarelto and observe for recurrent bleeding.  Goal of Therapy:  Monitor platelets by anticoagulation protocol: Yes   Plan:  Restart Xarelto 15 mg po bid to complete 3 weeks then taper to 20mg  po daily F/U bleeding  Dontarius Sheley, [place] Drafts 10/29/2013,12:37 PM

## 2013-10-29 NOTE — Progress Notes (Signed)
Patient seen, independently examined and chart reviewed. I agree with exam, assessment and plan discussed with Holly Carrel, NP.  80 year old woman diagnosed with left lower extremity DVT 3/4 started on Xarelto presented to the emergency department with history of lower GI bleeding. Issues include rectal bleeding, acute blood loss anemia, recent DVT. She was seen in consultation with gastroenterology and underwent colonoscopy which was unremarkable except for large internal hemorrhoids and moderate diverticulosis. Bleeding felt to be secondary to large internal hemorrhoids. EGD showed normal esophagus, suspected esophageal motility disorder, large hiatal hernia, moderate diverticulosis in the duodenum.  PMH Fracture distal radial as an ulna DVT left lower extremity following fracture  Subjective: No bleeding. Overall feeling okay, worn out from physical therapy. Right arm hurting somewhat.  Objective: Afebrile, vital signs are stable. Appears calm and comfortable. Speech fluent and clear. Cardiovascular regular rate and rhythm. Respiratory clear to auscultation bilaterally. Abdomen soft. Right hand appears grossly unremarkable. 2+ bilateral lower extremity edema, chronic per patient.  Hemoglobin today 8.3, 9.0 yesterday.  No evidence of recurrent bleeding. Continue management as recommended by gastroenterology. Recheck her CBC in the morning, continue anticoagulation for now for DVT. May be able to transfer to skilled nursing facility 3/19. Discussed with family at bedside.  [name], MD [facility] [phone]

## 2013-10-29 NOTE — Progress Notes (Signed)
[facility] PROGRESS NOTE  Holly Sloan WPY:[id number] DOB: 29-Jan-1929 DOA: 10/26/2013 PCP: Delphina Cahill, MD   Summary: 80 year old admitted with hematochezia on xarelto due to recent dx DVT related to large internal hemorrhoid per colonoscopy results. Hg trended down. EGD mild erosive gastritis.    Assessment/Plan: 1. Rectal bleeding. Patient has a history of hemorrhoids and was on anticoagulation for DVT. She has undergone endoscopic evaluation and bleeding appears to be coming from internal hemorrhoids. We'll continue with Anusol suppositories. H pylori serology pending.  Her bleeding appears to have resolved at this time but Hg dropped today. Anticoagulation restarted. Recheck in am. If trending down will transfuse 2. Acute blood loss anemia. Hg trending downward. Hg 8.3 today from 9.0 yesterday. No further active bleeding.  Continue to follow hemoglobin and transfuse as necessary. Baseline hemoglobin is not clear since last lab values in our system are from 2012 and Hgb noted to be 12. Most recent value is 9 and is stable. 3. Recent LLE DVT diagnosed on 10/15/13. Patient was found to have a recent left lower extremity DVT. She's been taking Xarelto which has been resumed. Her bleeding appears to be hemorrhoidal in nature and seems to have stopped.  4. Right radius fx. Patient recently had fracture of right radius. Arm is in splint. Follow up with orthopedics as scheduled. Keep elevated 5. Hypoalbuminemia. Outpatient workup underway with 24-hour urine protein. She is following up with oncology clinic regarding this matter. 6. Erosive gastritis: mild per EGD. Will provide PPI BD. 7. Dysphagia: per GI likely secondary to underlying motility disorder in setting of large hiatal hernia. Dysphagia 3 diet. OP follow up   Code Status: full Family Communication: family at bedside Disposition Plan: skilled facility likely tomorrow   Consultants:  GI  Procedures: Endoscopy 3/17: 1.  NORMAL ESOPHAGUS  2. DYSPHAGIA MOST LIKELY DUE TO ESO MOTILITY DISORDER AND/OR  INTERMITTENT FOOD BOLUS IN HIATAL HERNIA POUCH  3. Large hiatal hernia  4. MILD Erosive gastritis  5. MODERATE DIVERTICULOSIS IN THE DUODENUM  Colonoscopy 3/17:1. Normal mucosa in the terminal ileum  2. The colon IS redundant  3. Moderate diverticulosis in the descending and sigmoid colon  4. RECTAL BLEEDING DUE TO Large internal hemorrhoids     Antibiotics:    HPI/Subjective: Sitting up in bed reading paper. Denies pain/discomfort.   Objective: Filed Vitals:   10/29/13 1417  BP: 124/58  Pulse: 91  Temp: 97.9 F (36.6 C)  Resp: 16    Intake/Output Summary (Last 24 hours) at 10/29/13 1441 Last data filed at 10/28/13 1900  Gross per 24 hour  Intake    240 ml  Output      0 ml  Net    240 ml   Filed Weights   10/26/13 1631  Weight: 75.751 kg (167 lb)    Exam:   General:  Well nourished  Cardiovascular: RRR No MGR Trace to 1+ LE edema  Respiratory: normal effort BS clear bilaterally to auscultation  Abdomen: soft +BS throughout non-tender to palpation no guarding  Musculoskeletal:  Right arm in long arm cast/splint. Fingers with good ROM, swelling and bruising. Slightly cool to touch.   Data Reviewed: Basic Metabolic Panel:  Recent Labs Lab 10/24/13 1859 10/25/13 2137 10/26/13 1701 10/27/13 0513 10/28/13 0527  NA 140 139 137 141 145  K 3.9 4.4 4.1 4.1 3.7  CL 105 105 101 110 110  CO2 27 28 25 26 29   GLUCOSE 107* 106* 103* 96 84  BUN  38* 38* 36* 32* 31*  CREATININE 0.93 1.01 1.05 1.00 0.91  CALCIUM 9.5 9.3 9.4 8.5 8.6   Liver Function Tests:  Recent Labs Lab 10/24/13 1859 10/26/13 1701  AST 19 22  ALT 16 17  ALKPHOS 125* 146*  BILITOT 0.4 0.3  PROT 4.4* 4.5*  ALBUMIN 1.8* 1.8*   No results found for this basename: LIPASE, AMYLASE,  in the last 168 hours No results found for this basename: AMMONIA,  in the last 168 hours CBC:  Recent Labs Lab  10/24/13 1859 10/25/13 2137 10/26/13 1701 10/27/13 0513 10/27/13 1951 10/28/13 0527 10/29/13 0508  WBC 8.9 7.6 8.3 6.9 7.8 7.0 6.1  NEUTROABS 6.2 4.6 5.3  --   --   --   --   HGB 9.9* 9.9* 10.0* 8.1* 9.5* 9.0* 8.3*  HCT 29.3* 29.4* 30.0* 25.1* 29.1* 28.0* 25.6*  MCV 88.5 89.4 89.8 90.3 91.8 91.8 91.8  PLT 382 363 453* 348 393 345 307   Cardiac Enzymes:  Recent Labs Lab 10/26/13 1701  TROPONINI <0.30   BNP (last 3 results)  Recent Labs  10/26/13 1701  PROBNP 672.3*   CBG: No results found for this basename: GLUCAP,  in the last 168 hours  No results found for this or any previous visit (from the past 240 hour(s)).   Studies: No results found.  Scheduled Meds: . amLODipine  2.5 mg Oral Daily  . feeding supplement (PRO-STAT SUGAR FREE 64)  30 mL Oral QID  . furosemide  40 mg Oral QPM  . furosemide  80 mg Oral Daily  . gabapentin  300 mg Oral TID  . hydrocortisone  25 mg Rectal BID  . levothyroxine  25 mcg Oral QAC breakfast  . pantoprazole  40 mg Oral BID AC  . rivaroxaban  15 mg Oral BID WC   Followed by  . [START ON 11/05/2013] rivaroxaban  20 mg Oral Q supper  . sodium chloride  3 mL Intravenous Q12H  . spironolactone  25 mg Oral Daily   Continuous Infusions:   Principal Problem:   Rectal bleeding Active Problems:   HYPERTENSION, PULMONARY   DVT   Colles' fracture of right radius   Hypoalbuminemia   Chronic anemia   GIB (gastrointestinal bleeding)   Acute blood loss anemia   Hemorrhoid   Erosive gastritis   Hiatal hernia    Time spent: 35 minutes    [facility] Pager [phone]. If 7PM-7AM, please contact night-coverage at [email or web address], password [facility] 10/29/2013, 2:41 PM  LOS: 3 days

## 2013-10-30 ENCOUNTER — Observation Stay (HOSPITAL_COMMUNITY): Payer: Medicare HMO

## 2013-10-30 LAB — CBC
HCT: 27.2 % — ABNORMAL LOW (ref 36.0–46.0)
Hemoglobin: 8.6 g/dL — ABNORMAL LOW (ref 12.0–15.0)
MCH: 29 pg (ref 26.0–34.0)
MCHC: 31.6 g/dL (ref 30.0–36.0)
MCV: 91.6 fL (ref 78.0–100.0)
PLATELETS: 321 10*3/uL (ref 150–400)
RBC: 2.97 MIL/uL — ABNORMAL LOW (ref 3.87–5.11)
RDW: 15.5 % (ref 11.5–15.5)
WBC: 6.9 10*3/uL (ref 4.0–10.5)

## 2013-10-30 LAB — H. PYLORI ANTIBODY, IGG: H Pylori IgG: 1.06 {ISR} — ABNORMAL HIGH

## 2013-10-30 MED ORDER — PANTOPRAZOLE SODIUM 40 MG PO TBEC: 40.0000 mg | DELAYED_RELEASE_TABLET | Freq: Two times a day (BID) | ORAL | Status: DC

## 2013-10-30 MED ORDER — RIVAROXABAN 15 MG PO TABS: ORAL_TABLET | ORAL | Status: DC

## 2013-10-30 MED ORDER — RIVAROXABAN 20 MG PO TABS: ORAL_TABLET | ORAL | Status: DC

## 2013-10-30 MED ORDER — TRAMADOL HCL 50 MG PO TABS: 50.0000 mg | ORAL_TABLET | Freq: Four times a day (QID) | ORAL | Status: DC | PRN

## 2013-10-30 MED ORDER — HYDROCORTISONE ACETATE 25 MG RE SUPP: RECTAL | Status: DC

## 2013-10-30 MED ORDER — HYDROCORTISONE ACETATE 25 MG RE SUPP: 25.0000 mg | Freq: Two times a day (BID) | RECTAL | Status: DC

## 2013-10-30 NOTE — Discharge Summary (Signed)
Physician Discharge Summary  Holly Sloan [id number] DOB: 1929-01-31 DOA: 10/26/2013  PCP: Delphina Cahill, MD  Admit date: 10/26/2013 Discharge date: 10/30/2013  Time spent: 40 minutes  Recommendations for Outpatient Follow-up:  1. PCP in 1 week for evaluation of rectal bleeding. Recommend CBC to track Hg.  2. Dr. Oneida Alar in 4 weeks. Office to follow H pylori results 3. Has appointment with orthopedic service 4/16 4. Being discharged to facility. Non-weight bearing on left hip. Recommend monitoring for bleeding 5. Dr. Aline Brochure in 7-10 days for evaluation of left hip and possible referral to [facility]   Discharge Diagnoses:  Principal Problem:   Rectal bleeding Active Problems:   HYPERTENSION, PULMONARY   DVT   Colles' fracture of right radius   Hypoalbuminemia   Chronic anemia   GIB (gastrointestinal bleeding)   Acute blood loss anemia   Hemorrhoid   Erosive gastritis   Hiatal hernia   Discharge Condition: stable  Diet recommendation: Dysphagia 3 thin liquid  Filed Weights   10/26/13 1631  Weight: 75.751 kg (167 lb)    History of present illness:  80 yo female recent dvt 3/15 started on xarolto on 10/15/13, chf, recent fall with right wrist fracture presented to ED on 10/26/13 with lgib, had h/o hemorrhoids,  had several episodes of bleeding with bm. No n/v. No abd pain. No diarrhea. No fevers. No dysuria. On rectal exam with edp had some bright blood with brown stool and external hemorrhoids. This was actually her 3rd ED visit in two days with her dtrs. They were very frustrated because she was not admitted the night before. They received a phone call from dr rancour this  to bring her back to the ED for concerns of her anemia.   Hospital Course:  1. Rectal bleeding. Patient has a history of hemorrhoids and was on anticoagulation for DVT. She has undergone endoscopic evaluation and bleeding appears to be coming from internal hemorrhoids. We'll continue with Anusol  suppositories for 2 weeks ending on 11/11/13. H pylori serology to be followed by Dr. Oneida Alar. Her bleeding resolved at discharge. Anticoagulation restarted 10/29/13 as benefits out weigh risk. Hg stable at discharge. Recommend CBC 1 week for evaluation of Hg and monitoring for bleeding 2. Acute blood loss anemia. Hg stable at  8.6 on discharge. No further active bleeding. Baseline hemoglobin is not clear since last lab values in our system are from 2012 and Hgb noted to be 12. Most recent value is 9 and is stable. Recommend CBC 1 week for evaluation Hg 3. Recent LLE DVT diagnosed on 10/15/13. Patient was found to have a recent left lower extremity DVT. She's been taking Xarelto which has been resumed. Her bleeding appears to be hemorrhoidal in nature and seems to have stopped.  4. Right radius fx. Patient recently had fracture of right radius. Arm is in splint. Follow up with orthopedics as scheduled. Keep elevated 5. Hypoalbuminemia. Outpatient workup underway with 24-hour urine protein. She is following up with oncology clinic regarding this matter. 6. Erosive gastritis: mild per EGD. Will provide PPI BD. 7. Dysphagia: per GI likely secondary to underlying motility disorder in setting of large hiatal hernia. Dysphagia 3 diet. OP follow up 8. Left hip and thigh pain: xray concerning for avascular necrosis. Patient currently not candidate for surgery per Dr. Aline Brochure secondary to DVT therapy. Recommends non-weight bearing on left, and follow up with him in 7-10 days for evaluation and possible referral to [facility].   Procedures: Endoscopy 3/17: 1. NORMAL ESOPHAGUS  2. DYSPHAGIA MOST LIKELY DUE TO ESO MOTILITY DISORDER AND/OR  INTERMITTENT FOOD BOLUS IN HIATAL HERNIA POUCH  3. Large hiatal hernia  4. MILD Erosive gastritis  5. MODERATE DIVERTICULOSIS IN THE DUODENUM  Colonoscopy 3/17:1. Normal mucosa in the terminal ileum  2. The colon IS redundant  3. Moderate diverticulosis in the descending and  sigmoid colon  4. RECTAL BLEEDING DUE TO Large internal hemorrhoids   Consultations:  Dr. Oneida Alar Gastroenterology  Discharge Exam: Filed Vitals:   10/30/13 0416  BP: 104/65  Pulse: 82  Temp: 98 F (36.7 C)  Resp: 20    General: appears comfortable Cardiovascular: RRR No MGR trace LE edema Respiratory: normal effort BS clear bilaterally no wheeze Musculoskeletal: right arm in cast/splint Fingers slightly cool to touch with improved swelling and less discoloration. Good ROM to fingers  Discharge Instructions   Future Appointments Provider Department Dept Phone   11/17/2013 1:40 PM [place] [phone]   11/17/2013 2:00 PM [place] Provider [place] [phone]   11/27/2013 3:45 PM Carole Civil, MD [facility] [phone]       Medication List         amLODipine 2.5 MG tablet  Commonly known as:  NORVASC  Take 2.5 mg by mouth daily.     feeding supplement (PRO-STAT SUGAR FREE 64) Liqd  Take 30 mLs by mouth 4 (four) times daily.     furosemide 40 MG tablet  Commonly known as:  LASIX  Take 40-80 mg by mouth 2 (two) times daily. Two tablets in the morning and one tablet in the evening For swelling     gabapentin 300 MG capsule  Commonly known as:  NEURONTIN  Take 300 mg by mouth 3 (three) times daily.     hydrocortisone 25 MG suppository  Commonly known as:  ANUSOL-HC  For 2 weeks with stop date 11/11/13.     levothyroxine 25 MCG tablet  Commonly known as:  SYNTHROID, LEVOTHROID  Take 25 mcg by mouth every morning.     pantoprazole 40 MG tablet  Commonly known as:  PROTONIX  Take 1 tablet (40 mg total) by mouth 2 (two) times daily before a meal.     PROBIOTIC DAILY PO  Take 1 capsule by mouth daily.     Rivaroxaban 15 MG Tabs tablet  Commonly known as:  XARELTO  Take twice daily until 3/25.     Rivaroxaban 20 MG Tabs tablet  Commonly known as:  XARELTO  Take one tab  daily starting 11/05/13.     spironolactone 25 MG tablet  Commonly known as:  ALDACTONE  Take 25 mg by mouth daily.     traMADol 50 MG tablet  Commonly known as:  ULTRAM  Take 1 tablet (50 mg total) by mouth every 6 (six) hours as needed.       No Known Allergies     Follow-up Information   Follow up with Delphina Cahill, MD. Schedule an appointment as soon as possible for a visit in 1 week. (for evaluation of rectal bleeding. Recommend CBC to track Hg)    Specialty:  Internal Medicine   Contact informationLinna Hoff  [place] [address]       Follow up with Barney Drain, MD In 4 weeks. (follow up rectal bleeding)    Specialty:  Gastroenterology   Contact information:   [place] [address] [place] [address] [phone]  The results of significant diagnostics from this hospitalization (including imaging, microbiology, ancillary and laboratory) are listed below for reference.    Significant Diagnostic Studies: Dg Elbow 2 Views Right  October 21, 2013   CLINICAL DATA:  Fall yesterday.  Elbow pain.  EXAM: RIGHT ELBOW - 2 VIEW  COMPARISON:  None.  FINDINGS: No effusion or fracture. Normal joint alignment. Osteopenia. No focal bone lesion.  IMPRESSION: No acute osseous abnormality.   Electronically Signed   By: Jorje Guild M.D.   On: 2013/10/21 01:23   Dg Forearm Right  10/24/2013   CLINICAL DATA:  Recently diagnosed wrist fracture, right upper extremity pain and swelling  EXAM: RIGHT FOREARM - 2 VIEW  COMPARISON:  DG HUMERUS*R* dated 10/24/2013; DG WRIST COMPLETE*R* dated 10/21/2013  FINDINGS: Comminuted distal right radial fracture is reidentified. Mild apex volar angulation of the fracture fragments is noted. There is approximately 1/2 shaft width fracture fragment overlap. No significant callus formation. No radiopaque foreign body or soft tissue abnormality. No other forearm fracture is identified.  IMPRESSION: Distal right radial fracture reidentified.    Electronically Signed   By: Conchita Paris M.D.   On: 10/24/2013 17:58   Dg Wrist Complete Right  Oct 21, 2013   CLINICAL DATA:  Fall with pain  EXAM: RIGHT WRIST - COMPLETE 3+ VIEW  COMPARISON:  None.  FINDINGS: There is an acute transverse fracture through the distal radial metaphysis, with posterior impaction causing dorsal tilting of the wrist. Acquired ulnar positive variance. There is likely a fracture plane extending to the radiocarpal joint, without articular surface displacement. Distracted ulnar styloid process avulsion fracture.  Osteopenia.  First CMC osteoarthritis.  IMPRESSION: Displaced distal radius and ulna fractures, as above.   Electronically Signed   By: Jorje Guild M.D.   On: 10-21-13 01:31   Dg Hip Complete Left  10/30/2013   CLINICAL DATA:  left hip and thigh pain for 2 months, h/o left hip pinning  EXAM: LEFT HIP - COMPLETE 2+ VIEW  COMPARISON:  None.  FINDINGS: The patient is status post cannulated screw fixation of a femoral neck fracture. There has been interval development of fragmentation and sclerosis of the femoral head as well as areas of collapse. No further evidence of acute fracture is appreciated. Joint space narrowing is identified within the femoral acetabular joint as well as periarticular sclerosis involving the acetabulum and hypertrophic spurring. The bones are osteopenic.  IMPRESSION: Findings concerning for avascular necrosis involving the femoral head. A component of osteoarthritic changes within the left hip are also a diagnostic consideration. These results will be called to the ordering clinician or representative by the Radiologist Assistant, and communication documented in the PACS Dashboard.   Electronically Signed   By: Margaree Mackintosh M.D.   On: 10/30/2013 11:26   US Venous Img Lower Unilateral Left  10/15/2013   CLINICAL DATA:  Pain and swelling in the left lower extremity.  EXAM: LEFT LOWER EXTREMITY VENOUS DOPPLER ULTRASOUND  TECHNIQUE: Gray-scale  sonography with graded compression, as well as color Doppler and duplex ultrasound, were performed to evaluate the deep venous system from the level of the common femoral vein through the popliteal and proximal calf veins. Spectral Doppler was utilized to evaluate flow at rest and with distal augmentation maneuvers.  COMPARISON:  05/29/2011.  FINDINGS: Thrombus within deep veins: Extensive nonocclusive thrombus visualized throughout the left lower extremity involving the left common femoral vein, superficial femoral vein and popliteal vein.  Compressibility of deep veins: Incomplete compressibility in the areas of  deep venous thrombosis.  Duplex waveform respiratory phasicity:  Reduced phasicity.  Duplex waveform response to augmentation:  Poor.  Other findings:  Edema in the subcutaneous fat of the left calf.  IMPRESSION: 1. Extensive nonocclusive deep venous thrombosis in the left lower extremity extending from the common femoral vein to the popliteal vein, as above.   Electronically Signed   By: Vinnie Langton M.D.   On: 10/15/2013 14:55   US Venous Img Upper Uni Right  10/24/2013   CLINICAL DATA:  Right arm swelling and pain  EXAM: Right UPPER EXTREMITY VENOUS DOPPLER ULTRASOUND  TECHNIQUE: Gray-scale sonography with graded compression, as well as color Doppler and duplex ultrasound were performed to evaluate the upper extremity deep venous system from the level of the subclavian vein and including the jugular, axillary, basilic and upper cephalic vein. Spectral Doppler was utilized to evaluate flow at rest and with distal augmentation maneuvers.  COMPARISON:  None.  FINDINGS: Thrombus within deep veins:  None visualized.  Compressibility of deep veins:  Normal.  Duplex waveform respiratory phasicity:  Normal.  Duplex waveform response to augmentation:  Normal.  Venous reflux:  None visualized.  Other findings:  None visualized.  IMPRESSION: No evidence of right upper extremity deep venous thrombosis is  noted. The area of clinical concern is likely related to the patient's known distal radial fracture.   Electronically Signed   By: Inez Catalina M.D.   On: 10/24/2013 17:05   US Arterial Seg Single  10/24/2013   CLINICAL DATA:  Right arm discoloration  EXAM: NONINVASIVE PHYSIOLOGIC VASCULAR STUDY OF BILATERAL upper EXTREMITIES  TECHNIQUE: Evaluation of both lower extremities were performed at rest, including calculation of ankle-brachial indices with single level Doppler, pressure and pulse volume recording.  COMPARISON:  None.  FINDINGS: Right wrist brachial index:  1.06  Left wrist brachial index:  1.08  Right Lower Extremity: Multiphasic waveforms are noted although slightly dampened when compared with the left.  Left Lower Extremity:  Normal multiphasic waveforms.  IMPRESSION: Slight dampening of the waveforms in the right extremity although no definitive arterial abnormality is noted. The pain and swelling in the right hand and wrists is likely related to the patient's known underlying wrist fracture.   Electronically Signed   By: Inez Catalina M.D.   On: 10/24/2013 17:07   Dg Chest Portable 1 View  10/26/2013   CLINICAL DATA:  RECTAL BLEEDING.  WEAKNESS.  EXAM: PORTABLE CHEST - 1 VIEW  COMPARISON:  05/22/2011  FINDINGS: Heart size and pulmonary vascularity are normal. There is chronic elevation of the right hemidiaphragm. There appear to be new small bilateral pleural effusions. No infiltrates. No acute osseous abnormality.  IMPRESSION: New small bilateral pleural effusions.   Electronically Signed   By: Rozetta Nunnery M.D.   On: 10/26/2013 17:06   Dg Humerus Right  10/24/2013   CLINICAL DATA:  Right upper extremity pain, recent wrist fracture with immobilization in a cast  EXAM: RIGHT HUMERUS - 2+ VIEW  COMPARISON:  DG HAND COMPLETE*R* dated 10/24/2013; DG WRIST COMPLETE*R* dated 10/18/2013  FINDINGS: There is no evidence of fracture or other focal bone lesions. Soft tissues are unremarkable.  IMPRESSION:  Negative.   Electronically Signed   By: Conchita Paris M.D.   On: 10/24/2013 17:56   Dg Hand Complete Right  10/24/2013   CLINICAL DATA:  Right hand pain, history of known fracture  EXAM: RIGHT HAND - COMPLETE 3+ VIEW  COMPARISON:  None.  FINDINGS: There is a comminuted  distal right radial fracture with impaction and posterior angulation at the fracture site. The degree of angulation has increased in the interval from the prior exam as has the degree of impaction at the fracture site. Mild ulnar styloid avulsion is noted. No new fracture is noted.  IMPRESSION: Distal radial and ulnar fractures with slight increase in impaction and increase in the degree of posterior angulation at the fracture site.   Electronically Signed   By: Inez Catalina M.D.   On: 10/24/2013 17:48    Microbiology: No results found for this or any previous visit (from the past 240 hour(s)).   Labs: Basic Metabolic Panel:  Recent Labs Lab 10/24/13 1859 10/25/13 2137 10/26/13 1701 10/27/13 0513 10/28/13 0527  NA 140 139 137 141 145  K 3.9 4.4 4.1 4.1 3.7  CL 105 105 101 110 110  CO2 27 28 25 26 29   GLUCOSE 107* 106* 103* 96 84  BUN 38* 38* 36* 32* 31*  CREATININE 0.93 1.01 1.05 1.00 0.91  CALCIUM 9.5 9.3 9.4 8.5 8.6   Liver Function Tests:  Recent Labs Lab 10/24/13 1859 10/26/13 1701  AST 19 22  ALT 16 17  ALKPHOS 125* 146*  BILITOT 0.4 0.3  PROT 4.4* 4.5*  ALBUMIN 1.8* 1.8*   No results found for this basename: LIPASE, AMYLASE,  in the last 168 hours No results found for this basename: AMMONIA,  in the last 168 hours CBC:  Recent Labs Lab 10/24/13 1859 10/25/13 2137 10/26/13 1701 10/27/13 0513 10/27/13 1951 10/28/13 0527 10/29/13 0508 10/30/13 0512  WBC 8.9 7.6 8.3 6.9 7.8 7.0 6.1 6.9  NEUTROABS 6.2 4.6 5.3  --   --   --   --   --   HGB 9.9* 9.9* 10.0* 8.1* 9.5* 9.0* 8.3* 8.6*  HCT 29.3* 29.4* 30.0* 25.1* 29.1* 28.0* 25.6* 27.2*  MCV 88.5 89.4 89.8 90.3 91.8 91.8 91.8 91.6  PLT 382 363  453* 348 393 345 307 321   Cardiac Enzymes:  Recent Labs Lab 10/26/13 1701  TROPONINI <0.30   BNP: BNP (last 3 results)  Recent Labs  10/26/13 1701  PROBNP 672.3*   CBG: No results found for this basename: GLUCAP,  in the last 168 hours     Signed:  Radene Gunning  [facility] 10/30/2013, 11:33 AM

## 2013-10-30 NOTE — Discharge Summary (Addendum)
Patient seen, independently examined and chart reviewed. I agree with exam, assessment and plan discussed with Holly Carrel, NP.  Subjective: No bleeding. Right arm feels okay. She does complain of left-sided hip pain which has been present for 2 months. Present prior to DVT. Reports history of screw fixation for previous hip fracture.  Objective: Afebrile, vital signs are stable. No hypoxia. Appears calm and comfortable. Speech fluent and clear. Cardiovascular regular rate and rhythm. Respiratory clear to auscultation bilaterally. No wheezes, rales or rhonchi. Normal respiratory effort. 2+ bilateral lower extremity edema somewhat improved today. Left leg and foot warm, dry, grossly normal perfusion. Nontender to palpation. Left thigh and hip appear grossly unremarkable. Right hand appears grossly unremarkable with grossly normal perfusion of the fingers, some edema noted. Nontender. Warm. Psychiatric grossly normal mood and affect. Speech fluent and appropriate.  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  Hemoglobin stable 8.6.  Overall she appears stable for transfer to skilled nursing facility. Case was discussed with Dr. Aline Brochure orthopedics with recommendations as detailed  [name], MD [facility] [phone]

## 2013-10-30 NOTE — Plan of Care (Signed)
Problem: Phase III Progression Outcomes Goal: Activity at appropriate level-compared to baseline (UP IN CHAIR FOR HEMODIALYSIS)  Outcome: Not Applicable Date Met:  16/10/96 Pt bedridden at this time

## 2013-10-30 NOTE — Progress Notes (Signed)
Pt discharged to [facility] per Dr. Sarajane Jews. Pt's IV site D/C'd and WNL. Pt's VSS. Pt left floor via stretcher accompanied by EMS. EMS provided with FL2 packet.

## 2013-10-30 NOTE — Clinical Social Work Placement (Signed)
Clinical Social Work Department CLINICAL SOCIAL WORK PLACEMENT NOTE 10/30/2013  Patient:  Holly Sloan, Holly Sloan  Account Number:  [id number] Admit date:  10/26/2013  Clinical Social Worker:  Benay Pike, LCSW  Date/time:  10/28/2013 11:23 AM  Clinical Social Work is seeking post-discharge placement for this patient at the following level of care:   [facility]   (*CSW will update this form in Epic as items are completed)   10/28/2013  Patient/family provided with [facility] Department of Clinical Social Work's list of facilities offering this level of care within the geographic area requested by the patient (or if unable, by the patient's family).  10/28/2013  Patient/family informed of their freedom to choose among providers that offer the needed level of care, that participate in Medicare, Medicaid or managed care program needed by the patient, have an available bed and are willing to accept the patient.  10/28/2013  Patient/family informed of [facility]' ownership interest in [facility], as well as of the fact that they are under no obligation to receive care at this facility.  PASARR submitted to [employer] on  PASARR number received from [facility] on   FL2 transmitted to all facilities in geographic area requested by pt/family on  10/28/2013 FL2 transmitted to all facilities within larger geographic area on   Patient informed that his/her managed care company has contracts with or will negotiate with  certain facilities, including the following:     Patient/family informed of bed offers received:  10/29/2013 Patient chooses bed at [facility] Physician recommends and patient chooses bed at  [facility]  Patient to be transferred to [facility] on  10/30/2013 Patient to be transferred to facility by [facility]  The following physician request were entered in Epic:   Additional Comments: Pt has existing pasarr number.   Delfina Redwood  BSW Intern

## 2013-10-30 NOTE — Clinical Social Work Note (Signed)
Pt d/c to [facility] today. D/c summary faxed. Facility and pt aware. Pt to transfer via [facility].    Delfina Redwood BSW Intern

## 2013-10-30 NOTE — Plan of Care (Signed)
Problem: Phase I Progression Outcomes Goal: OOB as tolerated unless otherwise ordered Outcome: Not Applicable Date Met:  44/81/85 Pt bedridden at this time

## 2013-10-31 ENCOUNTER — Encounter (HOSPITAL_COMMUNITY): Payer: Self-pay | Admitting: Gastroenterology

## 2013-11-11 ENCOUNTER — Ambulatory Visit (INDEPENDENT_AMBULATORY_CARE_PROVIDER_SITE_OTHER): Payer: Commercial Managed Care - HMO | Admitting: Orthopedic Surgery

## 2013-11-11 ENCOUNTER — Encounter: Payer: Self-pay | Admitting: Orthopedic Surgery

## 2013-11-11 VITALS — BP 100/57 | Ht 64.0 in | Wt 167.0 lb

## 2013-11-11 DIAGNOSIS — T84498A Other mechanical complication of other internal orthopedic devices, implants and grafts, initial encounter: Secondary | ICD-10-CM | POA: Insufficient documentation

## 2013-11-11 DIAGNOSIS — S52539A Colles' fracture of unspecified radius, initial encounter for closed fracture: Secondary | ICD-10-CM

## 2013-11-11 DIAGNOSIS — S72009D Fracture of unspecified part of neck of unspecified femur, subsequent encounter for closed fracture with routine healing: Secondary | ICD-10-CM

## 2013-11-11 DIAGNOSIS — I82409 Acute embolism and thrombosis of unspecified deep veins of unspecified lower extremity: Secondary | ICD-10-CM

## 2013-11-11 DIAGNOSIS — S52531A Colles' fracture of right radius, initial encounter for closed fracture: Secondary | ICD-10-CM

## 2013-11-11 NOTE — Patient Instructions (Signed)
CONSULT [facility] : FOR REVISION LEFT HIP SECONDARY TO AVN LEFT AFTER CANNULATED SCREW FIXATION WITH RECENT DVT   NO WEIGHT BEARING ON THE LEFT LEG

## 2013-11-11 NOTE — Progress Notes (Signed)
Patient ID: Holly Sloan, female   DOB: [birth year], 80 y.o.   MRN: [id number]  Chief Complaint  Patient presents with  . Follow-up    Hospital follow up evaluate left hip     This patient is an ongoing treatment for a right Colles' fracture sustained on 10/17/2013 treated with cast. The cast was removed secondary to tightness and she was placed in a sugar tong splint. X-rays showed a slightly displaced slightly angulated fracture which was treated with a cast secondary to recent DVT. She is on xarelto for the DVT. During the course of treatment for DVT she was readmitted to the hospital and during that time she was complaining of pain in her left hip which she says she's had for 4 months. She did not mention this on her evaluation on March 10. X-rays show avascular necrosis and impinging hardware from the cannulated screw fixation from her left hip fracture back in 2012. She was seen in 08/24/2011 x-rays at that time showed no complications from the orthopedic implants.  System review she's been nonweightbearing she's been feeling a little fatigued. No chest pain shortness of breath heartburn. All other systems reviewed were negative.  Past Medical History  Diagnosis Date  . CHF (congestive heart failure)   . Hypertension   . Arthritis   . Kidney stone   . Pneumonia     20 years ago  . DVT (deep venous thrombosis) 10/14/13    left leg  . Fracture of wrist 10/18/13    right  . Hip fracture, left   . Deep vein blood clot of left lower extremity   . Hemorrhoids     large per colonoscopy 3/15  . Hiatal hernia     per endo 3/15  . Erosive gastritis     mild per endo 3/15   Past Surgical History  Procedure Laterality Date  . Appendectomy      as teenager  . Kidney stone surgery  jan 2012    stone remopved left side  . Hip pinning,cannulated  05/23/2011    Procedure: CANNULATED HIP PINNING;  Surgeon: Arther Abbott, MD;  Location: [facility];  Service: Orthopedics;  Laterality: Left;   late entry: updated foley documentation  . Hip open reduction Left 2012  . Esophagogastroduodenoscopy  April 2003    Dr. Gala Romney: 2 polyps proximal stomach, moderate sized hiatal hernia  . Colonoscopy  April 2003    Dr. Gala Romney: left-sided transverse diverticula  . Colonoscopy with esophagogastroduodenoscopy (egd) N/A 10/28/2013    Procedure: COLONOSCOPY WITH ESOPHAGOGASTRODUODENOSCOPY (EGD);  Surgeon: Danie Binder, MD;  Location: [facility]ITE;  Service: Endoscopy;  Laterality: N/A;    Vital signs:   General the patient is well-developed and well-nourished grooming and hygiene are normal Oriented x3 Mood and affect normal Ambulation confined to the wheelchair nonweightbearing on the left hip per my orders Inspection of the right wrist splint is removed there is deformity with ulnar prominence of the right wrist as noted in previous notes, excepted secondary to the recent DVT. She's placed in a new short arm cast. Notably the swelling on initial evaluation is significantly decreased back to normal size of the wrist and hand. The skin is intact. There is some pain to range of motion of the wrist the elbow joint is nontender and stable muscle tone is normal  She has a valgus alignment to the left knee she has painful range of motion of the left hip hip flexion is 120 with pain  painful internal rotation which is limited to neutral hip and knee joint are stable motor exam is normal distal pulses are intact with minimal to mild edema sensation remains normal   X-ray from March 18 shows avascular necrosis left hip with impending hardware impingement  Recommend hardware removal and total hip or bipolar hip replacement. However due to the complications related to the DVT in this setting recommend [place] filter and tertiary care referral to complicated nature of this case.

## 2013-11-13 ENCOUNTER — Telehealth: Payer: Self-pay | Admitting: Orthopedic Surgery

## 2013-11-13 NOTE — Telephone Encounter (Signed)
Left message of Dr. Ruthe Mannan reply.

## 2013-11-13 NOTE — Telephone Encounter (Signed)
Here's the deal  The hip takes precedence over the wrist   Any treatment needed for the wrist while the hip is being treated will be done there or THEY [facility] will tell her if she needs to come here and when

## 2013-11-13 NOTE — Telephone Encounter (Signed)
Mariea Clonts daughter, Tonna Boehringer asked if Reka needs to come back for a recheck on her wrist?  She was referred to [facility] for her hip. Pat's phone # [phone]

## 2013-11-13 NOTE — Telephone Encounter (Signed)
Noted  

## 2013-11-13 NOTE — Telephone Encounter (Signed)
Routing to Dr Harrison 

## 2013-11-17 ENCOUNTER — Encounter (HOSPITAL_COMMUNITY): Payer: Self-pay

## 2013-11-17 ENCOUNTER — Other Ambulatory Visit (HOSPITAL_COMMUNITY): Payer: Medicare HMO

## 2013-11-17 ENCOUNTER — Encounter (HOSPITAL_COMMUNITY): Payer: Medicare HMO | Attending: Hematology and Oncology

## 2013-11-17 VITALS — BP 95/57 | HR 94 | Temp 98.9°F | Resp 18

## 2013-11-17 DIAGNOSIS — D638 Anemia in other chronic diseases classified elsewhere: Secondary | ICD-10-CM

## 2013-11-17 DIAGNOSIS — E8809 Other disorders of plasma-protein metabolism, not elsewhere classified: Secondary | ICD-10-CM

## 2013-11-17 DIAGNOSIS — I82409 Acute embolism and thrombosis of unspecified deep veins of unspecified lower extremity: Secondary | ICD-10-CM

## 2013-11-17 DIAGNOSIS — S5292XA Unspecified fracture of left forearm, initial encounter for closed fracture: Secondary | ICD-10-CM

## 2013-11-17 DIAGNOSIS — Z86718 Personal history of other venous thrombosis and embolism: Secondary | ICD-10-CM

## 2013-11-17 DIAGNOSIS — D649 Anemia, unspecified: Secondary | ICD-10-CM | POA: Insufficient documentation

## 2013-11-17 LAB — CBC WITH DIFFERENTIAL/PLATELET
BASOS ABS: 0 10*3/uL (ref 0.0–0.1)
BASOS PCT: 1 % (ref 0–1)
Eosinophils Absolute: 0.2 10*3/uL (ref 0.0–0.7)
Eosinophils Relative: 3 % (ref 0–5)
HCT: 35.4 % — ABNORMAL LOW (ref 36.0–46.0)
HEMOGLOBIN: 11.3 g/dL — AB (ref 12.0–15.0)
Lymphocytes Relative: 28 % (ref 12–46)
Lymphs Abs: 1.7 10*3/uL (ref 0.7–4.0)
MCH: 28.5 pg (ref 26.0–34.0)
MCHC: 31.9 g/dL (ref 30.0–36.0)
MCV: 89.4 fL (ref 78.0–100.0)
MONO ABS: 0.8 10*3/uL (ref 0.1–1.0)
Monocytes Relative: 13 % — ABNORMAL HIGH (ref 3–12)
NEUTROS ABS: 3.5 10*3/uL (ref 1.7–7.7)
NEUTROS PCT: 56 % (ref 43–77)
Platelets: 367 10*3/uL (ref 150–400)
RBC: 3.96 MIL/uL (ref 3.87–5.11)
RDW: 16 % — AB (ref 11.5–15.5)
WBC: 6.2 10*3/uL (ref 4.0–10.5)

## 2013-11-17 MED ORDER — EPOETIN ALFA 40000 UNIT/ML IJ SOLN
INTRAMUSCULAR | Status: AC
  Filled 2013-11-17: qty 1

## 2013-11-17 MED ORDER — EPOETIN ALFA 40000 UNIT/ML IJ SOLN
40000.0000 [IU] | Freq: Once | INTRAMUSCULAR | Status: AC
  Administered 2013-11-17: 40000 [IU] via SUBCUTANEOUS

## 2013-11-17 NOTE — Progress Notes (Signed)
Holly Sloan presents today for injection per MD orders. Procrit 40,000 administered SQ in left Abdomen. Administration without incident. Patient tolerated well. Report called to [facility].

## 2013-11-17 NOTE — Patient Instructions (Signed)
[facility] Discharge Instructions  RECOMMENDATIONS MADE BY THE CONSULTANT AND ANY TEST RESULTS WILL BE SENT TO YOUR REFERRING PHYSICIAN. We will start seeing you weekly for labs and possible injection. You will see the doctor in 4 weeks.    Thank you for choosing [facility] to provide your oncology and hematology care.  To afford each patient quality time with our providers, please arrive at least 15 minutes before your scheduled appointment time.  With your help, our goal is to use those 15 minutes to complete the necessary work-up to ensure our physicians have the information they need to help with your evaluation and healthcare recommendations.    Effective January 1st, 2014, we ask that you re-schedule your appointment with our physicians should you arrive 10 or more minutes late for your appointment.  We strive to give you quality time with our providers, and arriving late affects you and other patients whose appointments are after yours.    Again, thank you for choosing [facility].  Our hope is that these requests will decrease the amount of time that you wait before being seen by our physicians.       _____________________________________________________________  Should you have questions after your visit to [facility], please contact our office at [phone] between the hours of 8:30 a.m. and 5:00 p.m.  Voicemails left after 4:30 p.m. will not be returned until the following business day.  For prescription refill requests, have your pharmacy contact our office with your prescription refill request.

## 2013-11-17 NOTE — Progress Notes (Signed)
[place], [facility], MD [place] [address]  DIAGNOSIS: Hypoalbuminemia  Acute venous embolism and thrombosis of unspecified deep vessels of lower extremity  Fracture of left radius  Anemia, unspecified - Plan: CBC with Differential, Ferritin, Erythropoietin, CBC with Differential, Ferritin, Erythropoietin  Chronic anemia - Plan: SCHEDULING COMMUNICATION INJECTION, PROCRIT TREATMENT CONDITION, SCHEDULING COMMUNICATION LAB, epoetin alfa (EPOGEN,PROCRIT) injection 40,000 Units, PROCRIT TREATMENT CONDITION  Chief Complaint  Patient presents with  . Hypoalbuminemia    CURRENT THERAPY: Undergoing workup for hypoalbuminemia.  INTERVAL HISTORY: Holly Sloan 80 y.o. 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 She is seeing well with no nausea, vomiting, diarrhea, fever, night sweats, cough, wheezing, PND, orthopnea, or palpitations. She continues on Xarelto for deep venous thrombosis involving the left lower extremity. Plans are to perform left hip replacement since previously placed screws have become misaligned. She remains in good spirits and is currently undergoing rehabilitation for her right upper extremity and left shoulder. She is nonweightbearing on the left hip.  MEDICAL HISTORY: Past Medical History  Diagnosis Date  . CHF (congestive heart failure)   . Hypertension   . Arthritis   . Kidney stone   . Pneumonia     20 years ago  . DVT (deep venous thrombosis) 10/14/13    left leg  . Fracture of wrist 10/18/13    right  . Hip fracture, left   . Deep vein blood clot of left lower extremity   . Hemorrhoids     large per colonoscopy 3/15  .  Hiatal hernia     per endo 3/15  . Erosive gastritis     mild per endo 3/15    INTERIM HISTORY: has LIPOMA; HYPERTENSION, PULMONARY; DVT; EDEMA; DYSPNEA; UTI (lower urinary tract infection); Hip fracture; Fracture of left hip; Colles' fracture of right radius; Fracture of left radius and ulna; Hypoalbuminemia; Hypothyroidism; Rectal bleeding; Chronic anemia; GIB (gastrointestinal bleeding); Acute blood loss anemia; Hemorrhoid; Erosive gastritis; Hiatal hernia; Failed orthopedic implant; and DVT (deep venous thrombosis) on her problem list.    ALLERGIES:  has No Known Allergies.  MEDICATIONS: has a current medication list which includes the following prescription(s): feeding supplement (pro-stat sugar free 64), amlodipine, ferrous sulfate, furosemide, gabapentin, hydrocortisone, levothyroxine, pantoprazole, probiotic product, rivaroxaban, rivaroxaban, senna, spironolactone, tramadol, and vitamin c.  SURGICAL HISTORY:  Past Surgical History  Procedure Laterality Date  . Appendectomy      as teenager  . Kidney stone surgery  jan 2012    stone remopved left side  . Hip pinning,cannulated  05/23/2011    Procedure: CANNULATED HIP PINNING;  Surgeon: Arther Abbott, MD;  Location: [facility];  Service: Orthopedics;  Laterality: Left;  late entry: updated foley documentation  . Hip open reduction Left 2012  . Esophagogastroduodenoscopy  April 2003    Dr. Gala Romney: 2 polyps proximal stomach, moderate sized hiatal hernia  . Colonoscopy  April 2003    Dr. Gala Romney: left-sided transverse diverticula  . Colonoscopy with esophagogastroduodenoscopy (egd) N/A 10/28/2013    Procedure: COLONOSCOPY WITH ESOPHAGOGASTRODUODENOSCOPY (EGD);  Surgeon: Danie Binder, MD;  Location: [facility];  Service: Endoscopy;  Laterality: N/A;  FAMILY HISTORY: family history is negative for Anesthesia problems, Hypotension, Malignant hyperthermia, Pseudochol deficiency, and Colon cancer.  SOCIAL HISTORY:  reports that she has  never smoked. She has never used smokeless tobacco. She reports that she does not drink alcohol or use illicit drugs.  REVIEW OF SYSTEMS:  Other than that discussed above is noncontributory.  PHYSICAL EXAMINATION: ECOG PERFORMANCE STATUS: 2 - Symptomatic, <50% confined to bed  Blood pressure 95/57, pulse 94, temperature 98.9 F (37.2 C), temperature source Oral, resp. rate 18, SpO2 96.00%.  GENERAL:alert, no distress and comfortable SKIN: skin color, texture, turgor are normal, no rashes or significant lesions EYES: PERLA; Conjunctiva are pink and non-injected, sclera clear SINUSES: No redness or tenderness over maxillary or ethmoid sinuses OROPHARYNX:no exudate, no erythema on lips, buccal mucosa, or tongue. NECK: supple, thyroid normal size, non-tender, without nodularity. No masses CHEST: Normal AP diameter with no breast masses. LYMPH:  no palpable lymphadenopathy in the cervical, axillary or inguinal LUNGS: clear to auscultation and percussion with normal breathing effort HEART: regular rate & rhythm and no murmurs. ABDOMEN:abdomen soft, non-tender and normal bowel sounds MUSCULOSKELETAL:no cyanosis of digits and no clubbing. Range of motion normal. Right upper extremity in a soft cast. Decreased range of motion of the left hip. NEURO: alert & oriented x 3 with fluent speech, no focal motor/sensory deficits   LABORATORY DATA: Office Visit on 11/17/2013  Component Date Value Ref Range Status  . WBC 11/17/2013 6.2  4.0 - 10.5 K/uL Final  . RBC 11/17/2013 3.96  3.87 - 5.11 MIL/uL Final  . Hemoglobin 11/17/2013 11.3* 12.0 - 15.0 g/dL Final  . HCT 11/17/2013 35.4* 36.0 - 46.0 % Final  . MCV 11/17/2013 89.4  78.0 - 100.0 fL Final  . MCH 11/17/2013 28.5  26.0 - 34.0 pg Final  . MCHC 11/17/2013 31.9  30.0 - 36.0 g/dL Final  . RDW 11/17/2013 16.0* 11.5 - 15.5 % Final  . Platelets 11/17/2013 367  150 - 400 K/uL Final  . Neutrophils Relative % 11/17/2013 56  43 - 77 % Final  . Neutro  Abs 11/17/2013 3.5  1.7 - 7.7 K/uL Final  . Lymphocytes Relative 11/17/2013 28  12 - 46 % Final  . Lymphs Abs 11/17/2013 1.7  0.7 - 4.0 K/uL Final  . Monocytes Relative 11/17/2013 13* 3 - 12 % Final  . Monocytes Absolute 11/17/2013 0.8  0.1 - 1.0 K/uL Final  . Eosinophils Relative 11/17/2013 3  0 - 5 % Final  . Eosinophils Absolute 11/17/2013 0.2  0.0 - 0.7 K/uL Final  . Basophils Relative 11/17/2013 1  0 - 1 % Final  . Basophils Absolute 11/17/2013 0.0  0.0 - 0.1 K/uL Final  Admission on 10/26/2013, Discharged on 10/30/2013  Component Date Value Ref Range Status  . WBC 10/26/2013 8.3  4.0 - 10.5 K/uL Final  . RBC 10/26/2013 3.34* 3.87 - 5.11 MIL/uL Final  . Hemoglobin 10/26/2013 10.0* 12.0 - 15.0 g/dL Final  . HCT 10/26/2013 30.0* 36.0 - 46.0 % Final  . MCV 10/26/2013 89.8  78.0 - 100.0 fL Final  . MCH 10/26/2013 29.9  26.0 - 34.0 pg Final  . MCHC 10/26/2013 33.3  30.0 - 36.0 g/dL Final  . RDW 10/26/2013 16.1* 11.5 - 15.5 % Final  . Platelets 10/26/2013 453* 150 - 400 K/uL Final  . Neutrophils Relative % 10/26/2013 64  43 - 77 % Final  . Neutro Abs 10/26/2013 5.3  1.7 - 7.7 K/uL Final  . Lymphocytes Relative 10/26/2013  23  12 - 46 % Final  . Lymphs Abs 10/26/2013 1.9  0.7 - 4.0 K/uL Final  . Monocytes Relative 10/26/2013 12  3 - 12 % Final  . Monocytes Absolute 10/26/2013 1.0  0.1 - 1.0 K/uL Final  . Eosinophils Relative 10/26/2013 1  0 - 5 % Final  . Eosinophils Absolute 10/26/2013 0.1  0.0 - 0.7 K/uL Final  . Basophils Relative 10/26/2013 1  0 - 1 % Final  . Basophils Absolute 10/26/2013 0.0  0.0 - 0.1 K/uL Final  . Sodium 10/26/2013 137  137 - 147 mEq/L Final  . Potassium 10/26/2013 4.1  3.7 - 5.3 mEq/L Final  . Chloride 10/26/2013 101  96 - 112 mEq/L Final  . CO2 10/26/2013 25  19 - 32 mEq/L Final  . Glucose, Bld 10/26/2013 103* 70 - 99 mg/dL Final  . BUN 10/26/2013 36* 6 - 23 mg/dL Final  . Creatinine, Ser 10/26/2013 1.05  0.50 - 1.10 mg/dL Final  . Calcium 10/26/2013 9.4   8.4 - 10.5 mg/dL Final  . Total Protein 10/26/2013 4.5* 6.0 - 8.3 g/dL Final  . Albumin 10/26/2013 1.8* 3.5 - 5.2 g/dL Final  . AST 10/26/2013 22  0 - 37 U/L Final  . ALT 10/26/2013 17  0 - 35 U/L Final  . Alkaline Phosphatase 10/26/2013 146* 39 - 117 U/L Final  . Total Bilirubin 10/26/2013 0.3  0.3 - 1.2 mg/dL Final  . GFR calc non Af Amer 10/26/2013 48* >90 mL/min Final  . GFR calc Af Amer 10/26/2013 56* >90 mL/min Final   Comment: (NOTE)                          The eGFR has been calculated using the CKD EPI equation.                          This calculation has not been validated in all clinical situations.                          eGFR's persistently <90 mL/min signify possible Chronic Kidney                          Disease.  Marland Kitchen Prothrombin Time 10/26/2013 20.8* 11.6 - 15.2 seconds Final  . INR 10/26/2013 1.85* 0.00 - 1.49 Final  . ABO/RH(D) 10/26/2013 A NEG   Final  . Antibody Screen 10/26/2013 NEG   Final  . Sample Expiration 10/26/2013 10/29/2013   Final  . Troponin I 10/26/2013 <0.30  <0.30 ng/mL Final   Comment:                                 Due to the release kinetics of cTnI,                          a negative result within the first hours                          of the onset of symptoms does not rule out                          myocardial infarction with certainty.  If myocardial infarction is still suspected,                          repeat the test at appropriate intervals.  . Color, Urine 10/26/2013 RED* YELLOW Final   BIOCHEMICALS MAY BE AFFECTED BY COLOR  . APPearance 10/26/2013 CLOUDY* CLEAR Final  . Specific Gravity, Urine 10/26/2013 <1.005* 1.005 - 1.030 Final  . pH 10/26/2013 5.5  5.0 - 8.0 Final  . Glucose, UA 10/26/2013 NEGATIVE  NEGATIVE mg/dL Final  . Hgb urine dipstick 10/26/2013 LARGE* NEGATIVE Final  . Bilirubin Urine 10/26/2013 NEGATIVE  NEGATIVE Final  . Ketones, ur 10/26/2013 NEGATIVE  NEGATIVE mg/dL Final  . Protein,  ur 10/26/2013 NEGATIVE  NEGATIVE mg/dL Final  . Urobilinogen, UA 10/26/2013 0.2  0.0 - 1.0 mg/dL Final  . Nitrite 10/26/2013 POSITIVE* NEGATIVE Final  . Leukocytes, UA 10/26/2013 SMALL* NEGATIVE Final  . Pro B Natriuretic peptide (BNP) 10/26/2013 672.3* 0 - 450 pg/mL Final  . WBC, UA 10/26/2013 7-10  <3 WBC/hpf Final  . RBC / HPF 10/26/2013 TOO NUMEROUS TO COUNT  <3 RBC/hpf Final  . Bacteria, UA 10/26/2013 MANY* RARE Final  . WBC 10/27/2013 6.9  4.0 - 10.5 K/uL Final  . RBC 10/27/2013 2.78* 3.87 - 5.11 MIL/uL Final  . Hemoglobin 10/27/2013 8.1* 12.0 - 15.0 g/dL Final   Comment: DELTA CHECK NOTED                          RESULT REPEATED AND VERIFIED  . HCT 10/27/2013 25.1* 36.0 - 46.0 % Final  . MCV 10/27/2013 90.3  78.0 - 100.0 fL Final  . MCH 10/27/2013 29.1  26.0 - 34.0 pg Final  . MCHC 10/27/2013 32.3  30.0 - 36.0 g/dL Final  . RDW 10/27/2013 16.0* 11.5 - 15.5 % Final  . Platelets 10/27/2013 348  150 - 400 K/uL Final   DELTA CHECK NOTED  . Sodium 10/27/2013 141  137 - 147 mEq/L Final  . Potassium 10/27/2013 4.1  3.7 - 5.3 mEq/L Final  . Chloride 10/27/2013 110  96 - 112 mEq/L Final   DELTA CHECK NOTED  . CO2 10/27/2013 26  19 - 32 mEq/L Final  . Glucose, Bld 10/27/2013 96  70 - 99 mg/dL Final  . BUN 10/27/2013 32* 6 - 23 mg/dL Final  . Creatinine, Ser 10/27/2013 1.00  0.50 - 1.10 mg/dL Final  . Calcium 10/27/2013 8.5  8.4 - 10.5 mg/dL Final  . GFR calc non Af Amer 10/27/2013 51* >90 mL/min Final  . GFR calc Af Amer 10/27/2013 59* >90 mL/min Final   Comment: (NOTE)                          The eGFR has been calculated using the CKD EPI equation.                          This calculation has not been validated in all clinical situations.                          eGFR's persistently <90 mL/min signify possible Chronic Kidney                          Disease.  Marland Kitchen Urine Total Volume-UPROT 10/27/2013 1250   Final  .  Collection Interval-UPROT 10/27/2013 24   Final  . Protein, Urine  10/27/2013 17   Final  . Protein, 24H Urine 10/27/2013 213* 50 - 100 mg/day Final  . WBC 10/27/2013 7.8  4.0 - 10.5 K/uL Final  . RBC 10/27/2013 3.17* 3.87 - 5.11 MIL/uL Final  . Hemoglobin 10/27/2013 9.5* 12.0 - 15.0 g/dL Final  . HCT 10/27/2013 29.1* 36.0 - 46.0 % Final  . MCV 10/27/2013 91.8  78.0 - 100.0 fL Final  . MCH 10/27/2013 30.0  26.0 - 34.0 pg Final  . MCHC 10/27/2013 32.6  30.0 - 36.0 g/dL Final  . RDW 10/27/2013 16.1* 11.5 - 15.5 % Final  . Platelets 10/27/2013 393  150 - 400 K/uL Final  . WBC 10/28/2013 7.0  4.0 - 10.5 K/uL Final  . RBC 10/28/2013 3.05* 3.87 - 5.11 MIL/uL Final  . Hemoglobin 10/28/2013 9.0* 12.0 - 15.0 g/dL Final  . HCT 10/28/2013 28.0* 36.0 - 46.0 % Final  . MCV 10/28/2013 91.8  78.0 - 100.0 fL Final  . MCH 10/28/2013 29.5  26.0 - 34.0 pg Final  . MCHC 10/28/2013 32.1  30.0 - 36.0 g/dL Final  . RDW 10/28/2013 16.2* 11.5 - 15.5 % Final  . Platelets 10/28/2013 345  150 - 400 K/uL Final  . Sodium 10/28/2013 145  137 - 147 mEq/L Final  . Potassium 10/28/2013 3.7  3.7 - 5.3 mEq/L Final  . Chloride 10/28/2013 110  96 - 112 mEq/L Final  . CO2 10/28/2013 29  19 - 32 mEq/L Final  . Glucose, Bld 10/28/2013 84  70 - 99 mg/dL Final  . BUN 10/28/2013 31* 6 - 23 mg/dL Final  . Creatinine, Ser 10/28/2013 0.91  0.50 - 1.10 mg/dL Final  . Calcium 10/28/2013 8.6  8.4 - 10.5 mg/dL Final  . GFR calc non Af Amer 10/28/2013 57* >90 mL/min Final  . GFR calc Af Amer 10/28/2013 66* >90 mL/min Final   Comment: (NOTE)                          The eGFR has been calculated using the CKD EPI equation.                          This calculation has not been validated in all clinical situations.                          eGFR's persistently <90 mL/min signify possible Chronic Kidney                          Disease.  . H Pylori IgG 10/29/2013 1.06*  Final   Comment: (NOTE)                          Result repeated and verified.                          Repeat testing in 10-14 days  in parallel with the first sample may be                          helpful.                            ISR =  Immune Status Ratio                                      <0.90         ISR       Negative                                      0.90 - 1.09   ISR       Equivocal                                      >=1.10        ISR       Positive                          The above results were obtained with the Immulite 2000 H. pylori IgG                          EIA.  Results obtained from other manufacturer's assay methods may not                          be used interchangeably.                          Performed at [facility]  . WBC 10/29/2013 6.1  4.0 - 10.5 K/uL Final  . RBC 10/29/2013 2.79* 3.87 - 5.11 MIL/uL Final  . Hemoglobin 10/29/2013 8.3* 12.0 - 15.0 g/dL Final  . HCT 10/29/2013 25.6* 36.0 - 46.0 % Final  . MCV 10/29/2013 91.8  78.0 - 100.0 fL Final  . MCH 10/29/2013 29.7  26.0 - 34.0 pg Final  . MCHC 10/29/2013 32.4  30.0 - 36.0 g/dL Final  . RDW 10/29/2013 15.9* 11.5 - 15.5 % Final  . Platelets 10/29/2013 307  150 - 400 K/uL Final  . WBC 10/30/2013 6.9  4.0 - 10.5 K/uL Final  . RBC 10/30/2013 2.97* 3.87 - 5.11 MIL/uL Final  . Hemoglobin 10/30/2013 8.6* 12.0 - 15.0 g/dL Final  . HCT 10/30/2013 27.2* 36.0 - 46.0 % Final  . MCV 10/30/2013 91.6  78.0 - 100.0 fL Final  . MCH 10/30/2013 29.0  26.0 - 34.0 pg Final  . MCHC 10/30/2013 31.6  30.0 - 36.0 g/dL Final  . RDW 10/30/2013 15.5  11.5 - 15.5 % Final  . Platelets 10/30/2013 321  150 - 400 K/uL Final  Admission on 10/25/2013, Discharged on 10/25/2013  Component Date Value Ref Range Status  . WBC 10/25/2013 7.6  4.0 - 10.5 K/uL Final  . RBC 10/25/2013 3.29* 3.87 - 5.11 MIL/uL Final  . Hemoglobin 10/25/2013 9.9* 12.0 - 15.0 g/dL Final  . HCT 10/25/2013 29.4* 36.0 - 46.0 % Final  . MCV 10/25/2013 89.4  78.0 - 100.0 fL Final  . MCH 10/25/2013 30.1  26.0 - 34.0 pg Final  . MCHC 10/25/2013 33.7  30.0 - 36.0 g/dL Final  . RDW  10/25/2013 15.9* 11.5 - 15.5 % Final  . Platelets 10/25/2013 363  150 - 400 K/uL Final  . Neutrophils Relative % 10/25/2013 61  43 - 77 % Final  . Neutro Abs 10/25/2013 4.6  1.7 - 7.7 K/uL Final  . Lymphocytes Relative 10/25/2013 27  12 - 46 % Final  . Lymphs Abs 10/25/2013 2.0  0.7 - 4.0 K/uL Final  . Monocytes Relative 10/25/2013 11  3 - 12 % Final  . Monocytes Absolute 10/25/2013 0.8  0.1 - 1.0 K/uL Final  . Eosinophils Relative 10/25/2013 1  0 - 5 % Final  . Eosinophils Absolute 10/25/2013 0.1  0.0 - 0.7 K/uL Final  . Basophils Relative 10/25/2013 1  0 - 1 % Final  . Basophils Absolute 10/25/2013 0.1  0.0 - 0.1 K/uL Final  . Prothrombin Time 10/25/2013 17.3* 11.6 - 15.2 seconds Final  . INR 10/25/2013 1.45  0.00 - 1.49 Final  . Sodium 10/25/2013 139  137 - 147 mEq/L Final  . Potassium 10/25/2013 4.4  3.7 - 5.3 mEq/L Final  . Chloride 10/25/2013 105  96 - 112 mEq/L Final  . CO2 10/25/2013 28  19 - 32 mEq/L Final  . Glucose, Bld 10/25/2013 106* 70 - 99 mg/dL Final  . BUN 10/25/2013 38* 6 - 23 mg/dL Final  . Creatinine, Ser 10/25/2013 1.01  0.50 - 1.10 mg/dL Final  . Calcium 10/25/2013 9.3  8.4 - 10.5 mg/dL Final  . GFR calc non Af Amer 10/25/2013 50* >90 mL/min Final  . GFR calc Af Amer 10/25/2013 58* >90 mL/min Final   Comment: (NOTE)                          The eGFR has been calculated using the CKD EPI equation.                          This calculation has not been validated in all clinical situations.                          eGFR's persistently <90 mL/min signify possible Chronic Kidney                          Disease.  Admission on 10/24/2013, Discharged on 10/24/2013  Component Date Value Ref Range Status  . WBC 10/24/2013 8.9  4.0 - 10.5 K/uL Final  . RBC 10/24/2013 3.31* 3.87 - 5.11 MIL/uL Final  . Hemoglobin 10/24/2013 9.9* 12.0 - 15.0 g/dL Final  . HCT 10/24/2013 29.3* 36.0 - 46.0 % Final  . MCV 10/24/2013 88.5  78.0 - 100.0 fL Final  . MCH 10/24/2013 29.9  26.0 -  34.0 pg Final  . MCHC 10/24/2013 33.8  30.0 - 36.0 g/dL Final  . RDW 10/24/2013 15.4  11.5 - 15.5 % Final  . Platelets 10/24/2013 382  150 - 400 K/uL Final  . Neutrophils Relative % 10/24/2013 70  43 - 77 % Final  . Neutro Abs 10/24/2013 6.2  1.7 - 7.7 K/uL Final  . Lymphocytes Relative 10/24/2013 20  12 - 46 % Final  . Lymphs Abs 10/24/2013 1.8  0.7 - 4.0 K/uL Final  . Monocytes Relative 10/24/2013 9  3 - 12 % Final  . Monocytes Absolute 10/24/2013 0.8  0.1 - 1.0 K/uL Final  . Eosinophils Relative 10/24/2013 1  0 - 5 % Final  . Eosinophils Absolute 10/24/2013 0.1  0.0 - 0.7 K/uL Final  . Basophils Relative 10/24/2013 0  0 - 1 % Final  .  Basophils Absolute 10/24/2013 0.0  0.0 - 0.1 K/uL Final  . Sodium 10/24/2013 140  137 - 147 mEq/L Final  . Potassium 10/24/2013 3.9  3.7 - 5.3 mEq/L Final  . Chloride 10/24/2013 105  96 - 112 mEq/L Final  . CO2 10/24/2013 27  19 - 32 mEq/L Final  . Glucose, Bld 10/24/2013 107* 70 - 99 mg/dL Final  . BUN 10/24/2013 38* 6 - 23 mg/dL Final  . Creatinine, Ser 10/24/2013 0.93  0.50 - 1.10 mg/dL Final  . Calcium 10/24/2013 9.5  8.4 - 10.5 mg/dL Final  . Total Protein 10/24/2013 4.4* 6.0 - 8.3 g/dL Final  . Albumin 10/24/2013 1.8* 3.5 - 5.2 g/dL Final  . AST 10/24/2013 19  0 - 37 U/L Final  . ALT 10/24/2013 16  0 - 35 U/L Final  . Alkaline Phosphatase 10/24/2013 125* 39 - 117 U/L Final  . Total Bilirubin 10/24/2013 0.4  0.3 - 1.2 mg/dL Final  . GFR calc non Af Amer 10/24/2013 56* >90 mL/min Final  . GFR calc Af Amer 10/24/2013 65* >90 mL/min Final   Comment: (NOTE)                          The eGFR has been calculated using the CKD EPI equation.                          This calculation has not been validated in all clinical situations.                          eGFR's persistently <90 mL/min signify possible Chronic Kidney                          Disease.  . Kappa free light chain 10/24/2013 3.84* 0.33 - 1.94 mg/dL Final  . Lamda free light chains  10/24/2013 2.88* 0.57 - 2.63 mg/dL Final  . Kappa, lamda light chain ratio 10/24/2013 1.33  0.26 - 1.65 Final   Performed at [facility]  . LDH 10/24/2013 241  94 - 250 U/L Final  . Prealbumin 10/24/2013 17.2* 17.0 - 34.0 mg/dL Final   Performed at [facility]  . Retic Ct Pct 10/24/2013 4.5* 0.4 - 3.1 % Final  . RBC. 10/24/2013 3.31* 3.87 - 5.11 MIL/uL Final  . Retic Count, Manual 10/24/2013 149.0  19.0 - 186.0 K/uL Final  . TSH 10/24/2013 4.699* 0.350 - 4.500 uIU/mL Final   Performed at [place]: No new pathology.  Urinalysis    Component Value Date/Time   COLORURINE RED* 10/26/2013 1811   APPEARANCEUR CLOUDY* 10/26/2013 1811   LABSPEC <1.005* 10/26/2013 1811   PHURINE 5.5 10/26/2013 1811   GLUCOSEU NEGATIVE 10/26/2013 1811   HGBUR LARGE* 10/26/2013 1811   BILIRUBINUR NEGATIVE 10/26/2013 1811   KETONESUR NEGATIVE 10/26/2013 1811   PROTEINUR NEGATIVE 10/26/2013 1811   UROBILINOGEN 0.2 10/26/2013 1811   NITRITE POSITIVE* 10/26/2013 1811   LEUKOCYTESUR SMALL* 10/26/2013 1811    RADIOGRAPHIC STUDIES: Dg Forearm Right  10/24/2013   CLINICAL DATA:  Recently diagnosed wrist fracture, right upper extremity pain and swelling  EXAM: RIGHT FOREARM - 2 VIEW  COMPARISON:  DG HUMERUS*R* dated 10/24/2013; DG WRIST COMPLETE*R* dated 10/18/2013  FINDINGS: Comminuted distal right radial fracture is reidentified. Mild apex volar angulation of the fracture fragments is noted. There is  approximately 1/2 shaft width fracture fragment overlap. No significant callus formation. No radiopaque foreign body or soft tissue abnormality. No other forearm fracture is identified.  IMPRESSION: Distal right radial fracture reidentified.   Electronically Signed   By: Conchita Paris M.D.   On: 10/24/2013 17:58   Dg Hip Complete Left  10/30/2013   CLINICAL DATA:  left hip and thigh pain for 2 months, h/o left hip pinning  EXAM: LEFT HIP - COMPLETE 2+ VIEW  COMPARISON:  None.  FINDINGS:  The patient is status post cannulated screw fixation of a femoral neck fracture. There has been interval development of fragmentation and sclerosis of the femoral head as well as areas of collapse. No further evidence of acute fracture is appreciated. Joint space narrowing is identified within the femoral acetabular joint as well as periarticular sclerosis involving the acetabulum and hypertrophic spurring. The bones are osteopenic.  IMPRESSION: Findings concerning for avascular necrosis involving the femoral head. A component of osteoarthritic changes within the left hip are also a diagnostic consideration. These results will be called to the ordering clinician or representative by the Radiologist Assistant, and communication documented in the PACS Dashboard.   Electronically Signed   By: Margaree Mackintosh M.D.   On: 10/30/2013 11:26   US Venous Img Upper Uni Right  10/24/2013   CLINICAL DATA:  Right arm swelling and pain  EXAM: Right UPPER EXTREMITY VENOUS DOPPLER ULTRASOUND  TECHNIQUE: Gray-scale sonography with graded compression, as well as color Doppler and duplex ultrasound were performed to evaluate the upper extremity deep venous system from the level of the subclavian vein and including the jugular, axillary, basilic and upper cephalic vein. Spectral Doppler was utilized to evaluate flow at rest and with distal augmentation maneuvers.  COMPARISON:  None.  FINDINGS: Thrombus within deep veins:  None visualized.  Compressibility of deep veins:  Normal.  Duplex waveform respiratory phasicity:  Normal.  Duplex waveform response to augmentation:  Normal.  Venous reflux:  None visualized.  Other findings:  None visualized.  IMPRESSION: No evidence of right upper extremity deep venous thrombosis is noted. The area of clinical concern is likely related to the patient's known distal radial fracture.   Electronically Signed   By: Inez Catalina M.D.   On: 10/24/2013 17:05   US Arterial Seg Single  10/24/2013    CLINICAL DATA:  Right arm discoloration  EXAM: NONINVASIVE PHYSIOLOGIC VASCULAR STUDY OF BILATERAL upper EXTREMITIES  TECHNIQUE: Evaluation of both lower extremities were performed at rest, including calculation of ankle-brachial indices with single level Doppler, pressure and pulse volume recording.  COMPARISON:  None.  FINDINGS: Right wrist brachial index:  1.06  Left wrist brachial index:  1.08  Right Lower Extremity: Multiphasic waveforms are noted although slightly dampened when compared with the left.  Left Lower Extremity:  Normal multiphasic waveforms.  IMPRESSION: Slight dampening of the waveforms in the right extremity although no definitive arterial abnormality is noted. The pain and swelling in the right hand and wrists is likely related to the patient's known underlying wrist fracture.   Electronically Signed   By: Inez Catalina M.D.   On: 10/24/2013 17:07   Dg Chest Portable 1 View  10/26/2013   CLINICAL DATA:  RECTAL BLEEDING.  WEAKNESS.  EXAM: PORTABLE CHEST - 1 VIEW  COMPARISON:  05/22/2011  FINDINGS: Heart size and pulmonary vascularity are normal. There is chronic elevation of the right hemidiaphragm. There appear to be new small bilateral pleural effusions. No infiltrates. No acute osseous  abnormality.  IMPRESSION: New small bilateral pleural effusions.   Electronically Signed   By: Rozetta Nunnery M.D.   On: 10/26/2013 17:06   Dg Humerus Right  10/24/2013   CLINICAL DATA:  Right upper extremity pain, recent wrist fracture with immobilization in a cast  EXAM: RIGHT HUMERUS - 2+ VIEW  COMPARISON:  DG HAND COMPLETE*R* dated 10/24/2013; DG WRIST COMPLETE*R* dated 10/18/2013  FINDINGS: There is no evidence of fracture or other focal bone lesions. Soft tissues are unremarkable.  IMPRESSION: Negative.   Electronically Signed   By: Conchita Paris M.D.   On: 10/24/2013 17:56   Dg Hand Complete Right  10/24/2013   CLINICAL DATA:  Right hand pain, history of known fracture  EXAM: RIGHT HAND - COMPLETE  3+ VIEW  COMPARISON:  None.  FINDINGS: There is a comminuted distal right radial fracture with impaction and posterior angulation at the fracture site. The degree of angulation has increased in the interval from the prior exam as has the degree of impaction at the fracture site. Mild ulnar styloid avulsion is noted. No new fracture is noted.  IMPRESSION: Distal radial and ulnar fractures with slight increase in impaction and increase in the degree of posterior angulation at the fracture site.   Electronically Signed   By: Inez Catalina M.D.   On: 10/24/2013 17:48    ASSESSMENT:  #1. Hypoalbuminemia from chronic disease with no evidence of liver dysfunction, malabsorption, inflammatory bowel disease, or celiac disease. #2. Anemia, mixed etiology with contributions from chronic disease as well as chronic blood loss. #3. Deep venous thrombosis left lower extremity, on Xarelto. #4. Hypothyroidism, on treatment, controlled. #5. Colles' fracture right upper extremity, now with soft cast with no evidence of vascular compromise. #6. Avascular necrosis left hip with misalignment of previously place screws.   PLAN:  #1. Procrit 40,000 units subcutaneously until hemoglobin reaches 11. Last value done at rehabilitation was 10.3 on 11/13/2013. #2. Office visit in 4 weeks with CBC and ferritin.   All questions were answered. The patient knows to call the clinic with any problems, questions or concerns. We can certainly see the patient much sooner if necessary.   I spent 25 minutes counseling the patient face to face. The total time spent in the appointment was 30 minutes.    Doroteo Bradford, MD 11/17/2013 5:03 PM

## 2013-11-18 ENCOUNTER — Other Ambulatory Visit (HOSPITAL_COMMUNITY): Payer: Self-pay | Admitting: Hematology and Oncology

## 2013-11-18 LAB — FERRITIN: Ferritin: 36 ng/mL (ref 10–291)

## 2013-11-19 LAB — ERYTHROPOIETIN: ERYTHROPOIETIN: 18.6 m[IU]/mL — AB (ref 2.6–18.5)

## 2013-11-20 NOTE — Telephone Encounter (Signed)
As of 11/16/13, due to patient's [employer] requiring primary care referral, all notes were faxed to patient's primary care, Dr. Wende Neighbors, for the referral to [facility] (this office has verified that they do accept this insurance and that primary care referral is needed effective immediately.)  Followed up 11/20/13-left message at Dr Juel Burrow office.  Patient and contact person [facility]) daughter Metro Kung # [phone], aware of status.

## 2013-11-24 ENCOUNTER — Other Ambulatory Visit (HOSPITAL_COMMUNITY): Payer: Medicare HMO

## 2013-11-24 ENCOUNTER — Telehealth: Payer: Self-pay | Admitting: *Deleted

## 2013-11-24 ENCOUNTER — Encounter (HOSPITAL_BASED_OUTPATIENT_CLINIC_OR_DEPARTMENT_OTHER): Payer: Medicare HMO

## 2013-11-24 VITALS — BP 90/60 | HR 85 | Temp 98.1°F | Resp 18

## 2013-11-24 DIAGNOSIS — E8809 Other disorders of plasma-protein metabolism, not elsewhere classified: Secondary | ICD-10-CM

## 2013-11-24 DIAGNOSIS — D638 Anemia in other chronic diseases classified elsewhere: Secondary | ICD-10-CM

## 2013-11-24 DIAGNOSIS — D649 Anemia, unspecified: Secondary | ICD-10-CM

## 2013-11-24 LAB — CBC
HEMATOCRIT: 39.9 % (ref 36.0–46.0)
Hemoglobin: 12.7 g/dL (ref 12.0–15.0)
MCH: 28.9 pg (ref 26.0–34.0)
MCHC: 31.8 g/dL (ref 30.0–36.0)
MCV: 90.7 fL (ref 78.0–100.0)
Platelets: 411 10*3/uL — ABNORMAL HIGH (ref 150–400)
RBC: 4.4 MIL/uL (ref 3.87–5.11)
RDW: 17.7 % — AB (ref 11.5–15.5)
WBC: 12.4 10*3/uL — ABNORMAL HIGH (ref 4.0–10.5)

## 2013-11-24 MED ORDER — FERUMOXYTOL INJECTION 510 MG/17 ML
1020.0000 mg | Freq: Once | INTRAVENOUS | Status: AC
  Administered 2013-11-24: 1020 mg via INTRAVENOUS
  Filled 2013-11-24: qty 34

## 2013-11-24 MED ORDER — SODIUM CHLORIDE 0.9 % IV SOLN
Freq: Once | INTRAVENOUS | Status: AC
  Administered 2013-11-24: 10:00:00 via INTRAVENOUS

## 2013-11-24 MED ORDER — SODIUM CHLORIDE 0.9 % IV SOLN: 1020.0000 mg | Freq: Once | INTRAVENOUS | Status: DC

## 2013-11-24 MED ORDER — SODIUM CHLORIDE 0.9 % IJ SOLN
10.0000 mL | Freq: Once | INTRAMUSCULAR | Status: AC
  Administered 2013-11-24: 10 mL via INTRAVENOUS

## 2013-11-24 NOTE — Telephone Encounter (Signed)
Office notes were faxed to DR. At[place]luak office, so they can make the referral to [facility] for second opinion. The referral has to come from the PCP, due to patient has the new [employer]. A call was made by our front end staff, Ihor Austin, on 11/20/13 to verify that Dr. Juel Burrow office received our fax. Patient's daughter aware of status.

## 2013-11-24 NOTE — Progress Notes (Signed)
Tolerated iron with out problems 

## 2013-11-27 ENCOUNTER — Ambulatory Visit: Payer: Commercial Managed Care - HMO | Admitting: Orthopedic Surgery

## 2013-12-01 ENCOUNTER — Other Ambulatory Visit (HOSPITAL_COMMUNITY): Payer: Medicare HMO

## 2013-12-01 ENCOUNTER — Ambulatory Visit (HOSPITAL_COMMUNITY): Payer: Medicare HMO

## 2013-12-08 ENCOUNTER — Other Ambulatory Visit (HOSPITAL_COMMUNITY): Payer: Medicare HMO

## 2013-12-08 ENCOUNTER — Ambulatory Visit (HOSPITAL_COMMUNITY): Payer: Medicare HMO

## 2013-12-08 ENCOUNTER — Telehealth: Payer: Self-pay | Admitting: Orthopedic Surgery

## 2013-12-08 NOTE — Telephone Encounter (Signed)
On Friday, 12/05/13, approximately 9:15am, received call via voice message from patient's daughter and contact person Tonna Boehringer, ph# [phone], asking our office about the referral to [facility], per last visit notes, for main problem of hip, and secondary, wrist.   I called Dr. Juel Burrow office to follow up - I spoke with Cybil, who assured that she will get the referral out today (12/05/13); states they had been working on it.  She voiced understanding about the process of why we had to fax the 2nd orthopaedic opinion referral notes  to primary care for them to make the referral.  I called back to Ms. Tye Savoy, reached her voice mail; left message with this information. *  ( Prior Phone notes indicated referral was faxed to Dr Josue Hector office on 11/24/13. As noted, due to patient's insurance, primary care to make the referral [facility] Gold Medicare). [facility]' referral department confirmed that they need referral to be generated by primary care for this insurance.    Follow up calls have been made to Dr Juel Burrow office in the interim period to follow up, and they assured our office they were working on it.  I have also, in the interim, notified Ms. Tye Savoy of status of Ms. Coutts's referral, in regard to Dr. Juel Burrow office working through the process of sending the referral. )

## 2013-12-10 NOTE — Telephone Encounter (Signed)
On 12/08/13 the patient's daughter came in to ask about the referral status, as she states no appointment has been scheduled. for her mother to [facility]. I advised her that the referral had to come from PCP, due to patient's [facility] Gold requirements. She stated she had talk to Dr. Juel Burrow office, and they stated we would receive something from [facility].  I called Dr. Juel Burrow office, and spoke with [name], and she advised me that Holly Sloan was working on this, and that [employer] would contact us. I advised her that they normally would contact the patient, not our office because we were not directly doing the referral.   The patient's daughter then was telling me that her mother was having increased swelling in her leg, and I advised her  to contact Dr. Juel Burrow office to follow up with him especially since the patient had recently been treated for a blood clot. I advised her that I would go ahead and send the referral and our office notes to [place] to try to expedite the process, and that her PCP would still need to generate the proper referral.Fax was sent on 12/09/13.   Today, on 12/10/13 I received call from patient's daughter stating that the facility [facility], where her mother is residing, is telling her that the patient may have to be moved to [facility] term care, due to they did not have orders to move forward with therapy.She stated she did hear from [facility] today, and DR, Alvan Dame is reviewing her notes. She is wanting to know since there has been such a delay in the referral process to [facility], do you want to see her mother one more time for the wrist?

## 2013-12-11 NOTE — Telephone Encounter (Signed)
Wrist ???  Talk to me after lunch please

## 2013-12-12 HISTORY — PX: OTHER SURGICAL HISTORY: SHX169

## 2013-12-15 ENCOUNTER — Encounter (HOSPITAL_COMMUNITY): Payer: Medicare HMO | Attending: Hematology and Oncology

## 2013-12-15 ENCOUNTER — Ambulatory Visit (HOSPITAL_COMMUNITY): Payer: Medicare HMO

## 2013-12-15 ENCOUNTER — Other Ambulatory Visit (HOSPITAL_COMMUNITY): Payer: Medicare HMO

## 2013-12-15 ENCOUNTER — Encounter (HOSPITAL_COMMUNITY): Payer: Self-pay

## 2013-12-15 VITALS — BP 98/62 | HR 100 | Temp 98.1°F | Resp 20

## 2013-12-15 DIAGNOSIS — D649 Anemia, unspecified: Secondary | ICD-10-CM | POA: Insufficient documentation

## 2013-12-15 DIAGNOSIS — I82409 Acute embolism and thrombosis of unspecified deep veins of unspecified lower extremity: Secondary | ICD-10-CM

## 2013-12-15 DIAGNOSIS — E8809 Other disorders of plasma-protein metabolism, not elsewhere classified: Secondary | ICD-10-CM

## 2013-12-15 DIAGNOSIS — E039 Hypothyroidism, unspecified: Secondary | ICD-10-CM

## 2013-12-15 NOTE — Telephone Encounter (Signed)
Patient is scheduled for 12/16/13

## 2013-12-15 NOTE — Telephone Encounter (Signed)
See all notes entered in reference to this phone note.  Appointment for wrist follow/up scheduled for tomorrow, 12/16/13, 11:15am, and hip appointment is still in review at [facility], patient and daughter aware.

## 2013-12-15 NOTE — Patient Instructions (Signed)
Colp Discharge Instructions  RECOMMENDATIONS MADE BY THE CONSULTANT AND ANY TEST RESULTS WILL BE SENT TO YOUR REFERRING PHYSICIAN.  EXAM FINDINGS BY THE PHYSICIAN TODAY AND SIGNS OR SYMPTOMS TO REPORT TO CLINIC OR PRIMARY PHYSICIAN:   Return to the [facility] in 3 months for labs and for MD visit  Thank you for choosing [facility] to provide your oncology and hematology care.  To afford each patient quality time with our providers, please arrive at least 15 minutes before your scheduled appointment time.  With your help, our goal is to use those 15 minutes to complete the necessary work-up to ensure our physicians have the information they need to help with your evaluation and healthcare recommendations.    Effective January 1st, 2014, we ask that you re-schedule your appointment with our physicians should you arrive 10 or more minutes late for your appointment.  We strive to give you quality time with our providers, and arriving late affects you and other patients whose appointments are after yours.    Again, thank you for choosing [facility].  Our hope is that these requests will decrease the amount of time that you wait before being seen by our physicians.       _____________________________________________________________  Should you have questions after your visit to [facility], please contact our office at [phone] between the hours of 8:30 a.m. and 5:00 p.m.  Voicemails left after 4:30 p.m. will not be returned until the following business day.  For prescription refill requests, have your pharmacy contact our office with your prescription refill request.

## 2013-12-15 NOTE — Progress Notes (Signed)
Holly Sloan  OFFICE PROGRESS NOTE  Holly Sloan, [facility], MD Holly Sloan [place] [address]  DIAGNOSIS: Anemia, unspecified - Plan: CBC with Differential, Ferritin  Acute venous embolism and thrombosis of unspecified deep vessels of lower extremity  Hypoalbuminemia  Hypothyroidism  No chief complaint on file.   CURRENT THERAPY: Hypoalbuminemia plus iron deficiency, status post Feraheme on 11/24/2013 with ferritin of 36 on 11/17/2013. She also received Procrit 40,000 units on 11/17/2013  INTERVAL HISTORY: Holly Sloan 80 y.o. female returns for followup of hypoalbuminemia plus iron deficiency, status post intravenous Feraheme on 11/24/2013.  Since her last visit she has undergone upper and lower endoscopy. Biopsy for H. pylori gastritis was negative but antibody test was equivocal. She sustained a laceration to the right lower leg while being lifted with a moist. She denies a fever, night sweats, still has left hip pain for which she is eager to undergo surgery. Right upper extremity is in a soft cast. She denies any PND, orthopnea, palpitations, lower extremity swelling or redness, melena, hematochezia, hematuria, but she does bruise easily. She denies hemoptysis, headache, or focal weakness. j  MEDICAL HISTORY: Past Medical History  Diagnosis Date  . CHF (congestive heart failure)   . Hypertension   . Arthritis   . Kidney stone   . Pneumonia     20 years ago  . DVT (deep venous thrombosis) 10/14/13    left leg  . Fracture of wrist 10/18/13    right  . Hip fracture, left   . Deep vein blood clot of left lower extremity   . Hemorrhoids     large per colonoscopy 3/15  . Hiatal hernia     per endo 3/15  . Erosive gastritis     mild per endo 3/15    INTERIM HISTORY: has LIPOMA; HYPERTENSION, PULMONARY; DVT; EDEMA; DYSPNEA; UTI (lower urinary tract infection); Hip fracture; Fracture of left hip; Colles' fracture of right radius; Fracture of left radius and  ulna; Hypoalbuminemia; Hypothyroidism; Rectal bleeding; Chronic anemia; GIB (gastrointestinal bleeding); Acute blood loss anemia; Hemorrhoid; Erosive gastritis; Hiatal hernia; Failed orthopedic implant; and DVT (deep venous thrombosis) on her problem list.    ALLERGIES:  has No Known Allergies.  MEDICATIONS: has a current medication list which includes the following prescription(s): feeding supplement (pro-stat sugar free 64), amlodipine, ferrous sulfate, furosemide, gabapentin, hydrocortisone, levothyroxine, pantoprazole, probiotic product, rivaroxaban, rivaroxaban, senna, spironolactone, tramadol, and vitamin c.  SURGICAL HISTORY:  Past Surgical History  Procedure Laterality Date  . Appendectomy      as teenager  . Kidney stone surgery  jan 2012    stone remopved left side  . Hip pinning,cannulated  05/23/2011    Procedure: CANNULATED HIP PINNING;  Surgeon: Arther Abbott, MD;  Location: [facility] ORS;  Service: Orthopedics;  Laterality: Left;  late entry: updated foley documentation  . Hip open reduction Left 2012  . Esophagogastroduodenoscopy  April 2003    Dr. Gala Romney: 2 polyps proximal stomach, moderate sized hiatal hernia  . Colonoscopy  April 2003    Dr. Gala Romney: left-sided transverse diverticula  . Colonoscopy with esophagogastroduodenoscopy (egd) N/A 10/28/2013    Procedure: COLONOSCOPY WITH ESOPHAGOGASTRODUODENOSCOPY (EGD);  Surgeon: Danie Binder, MD;  Location: [facility] ENDO SUITE;  Service: Endoscopy;  Laterality: N/A;    FAMILY HISTORY: family history is negative for Anesthesia problems, Hypotension, Malignant hyperthermia, Pseudochol deficiency, and Colon cancer.  SOCIAL HISTORY:  reports that she has never smoked. She has never used smokeless tobacco.  She reports that she does not drink alcohol or use illicit drugs.  REVIEW OF SYSTEMS:  Other than that discussed above is noncontributory.  PHYSICAL EXAMINATION: ECOG PERFORMANCE STATUS: 2 - Symptomatic, <50% confined to bed  There  were no vitals taken for this visit.  GENERAL:alert, no distress and comfortable SKIN: skin color, texture, turgor are normal, no rashes or significant lesions EYES: PERLA; Conjunctiva are pink and non-injected, sclera clear SINUSES: No redness or tenderness over maxillary or ethmoid sinuses OROPHARYNX:no exudate, no erythema on lips, buccal mucosa, or tongue. NECK: supple, thyroid normal size, non-tender, without nodularity. No masses CHEST: Normal AP diameter with no breast masses. LYMPH:  no palpable lymphadenopathy in the cervical, axillary or inguinal LUNGS: clear to auscultation and percussion with normal breathing effort HEART: regular rate & rhythm and no murmurs. ABDOMEN:abdomen soft, non-tender and normal bowel sounds MUSCULOSKELETAL:no cyanosis of digits and no clubbing. Decreased range of motion of the left hip.. Mid right shin is bandaged. Right upper extremity is in a soft cast. NEURO: alert & oriented x 3 with fluent speech, no focal motor/sensory deficits   LABORATORY DATA:  12/11/2013 [place] labs:  WBC 8.0, hemoglobin 11.6, platelets 297,000.   Results for Holly Sloan (MRN [id number]) as of 12/15/2013 13:04  Ref. Range 10/28/2013 05:27 10/29/2013 05:08 10/30/2013 05:12 11/17/2013 14:11 11/24/2013 10:05  Hemoglobin Latest Range: 12.0-15.0 g/dL 9.0 (L) 8.3 (L) 8.6 (L) 11.3 (L) 12.7     Infusion on 11/24/2013  Component Date Value Ref Range Status  . WBC 11/24/2013 12.4* 4.0 - 10.5 K/uL Final  . RBC 11/24/2013 4.40  3.87 - 5.11 MIL/uL Final  . Hemoglobin 11/24/2013 12.7  12.0 - 15.0 g/dL Final  . HCT 11/24/2013 39.9  36.0 - 46.0 % Final  . MCV 11/24/2013 90.7  78.0 - 100.0 fL Final  . MCH 11/24/2013 28.9  26.0 - 34.0 pg Final  . MCHC 11/24/2013 31.8  30.0 - 36.0 g/dL Final  . RDW 11/24/2013 17.7* 11.5 - 15.5 % Final  . Platelets 11/24/2013 411* 150 - 400 K/uL Final  Office Visit on 11/17/2013  Component Date Value Ref Range Status  . WBC 11/17/2013 6.2  4.0 - 10.5  K/uL Final  . RBC 11/17/2013 3.96  3.87 - 5.11 MIL/uL Final  . Hemoglobin 11/17/2013 11.3* 12.0 - 15.0 g/dL Final  . HCT 11/17/2013 35.4* 36.0 - 46.0 % Final  . MCV 11/17/2013 89.4  78.0 - 100.0 fL Final  . MCH 11/17/2013 28.5  26.0 - 34.0 pg Final  . MCHC 11/17/2013 31.9  30.0 - 36.0 g/dL Final  . RDW 11/17/2013 16.0* 11.5 - 15.5 % Final  . Platelets 11/17/2013 367  150 - 400 K/uL Final  . Neutrophils Relative % 11/17/2013 56  43 - 77 % Final  . Neutro Abs 11/17/2013 3.5  1.7 - 7.7 K/uL Final  . Lymphocytes Relative 11/17/2013 28  12 - 46 % Final  . Lymphs Abs 11/17/2013 1.7  0.7 - 4.0 K/uL Final  . Monocytes Relative 11/17/2013 13* 3 - 12 % Final  . Monocytes Absolute 11/17/2013 0.8  0.1 - 1.0 K/uL Final  . Eosinophils Relative 11/17/2013 3  0 - 5 % Final  . Eosinophils Absolute 11/17/2013 0.2  0.0 - 0.7 K/uL Final  . Basophils Relative 11/17/2013 1  0 - 1 % Final  . Basophils Absolute 11/17/2013 0.0  0.0 - 0.1 K/uL Final  . Ferritin 11/17/2013 36  10 - 291 ng/mL Final   Performed at [facility]  . Erythropoietin 11/17/2013 18.6* 2.6 - 18.5 mIU/mL Final   Performed at [place]: No new pathology.  Urinalysis    Component Value Date/Time   COLORURINE RED* 10/26/2013 1811   APPEARANCEUR CLOUDY* 10/26/2013 1811   LABSPEC <1.005* 10/26/2013 1811   PHURINE 5.5 10/26/2013 1811   GLUCOSEU NEGATIVE 10/26/2013 1811   HGBUR LARGE* 10/26/2013 1811   BILIRUBINUR NEGATIVE 10/26/2013 1811   KETONESUR NEGATIVE 10/26/2013 1811   PROTEINUR NEGATIVE 10/26/2013 1811   UROBILINOGEN 0.2 10/26/2013 1811   NITRITE POSITIVE* 10/26/2013 1811   LEUKOCYTESUR SMALL* 10/26/2013 1811    RADIOGRAPHIC STUDIES:       ASSESSMENT:  #1. Iron deficiency anemia, corrected. #2. Deep venous thrombosis left lower 70, on Xarelto. #3. Hypothyroidism, on treatment. #4. Colles' fracture right upper extremity, now with a soft cast. #5. Avascular necrosis of the left hip with  misalignment of the previously placed screws. #6. Hypoalbuminemia from chronic disease with no evidence of liver dysfunction, malabsorption, inflammatory bowel disease, or celiac disease.   PLAN:  #1. Continue Xarelto 20 mg daily. #2. Followup in 3 months with CBC and ferritin. If the patient is to undergo left hip surgery, discontinue Xarelto for 2 days prior to anticipated surgery and resume the day after.   All questions were answered. The patient knows to call the clinic with any problems, questions or concerns. We can certainly see the patient much sooner if necessary.   I spent 25 minutes counseling the patient face to face. The total time spent in the appointment was 30 minutes.    Farrel Gobble, MD 12/15/2013 1:03 PM  DISCLAIMER:  This note was dictated with voice recognition software.  Similar sounding words can inadvertently be transcribed inaccurately and may not be corrected upon review.

## 2013-12-16 ENCOUNTER — Encounter: Payer: Self-pay | Admitting: Orthopedic Surgery

## 2013-12-16 ENCOUNTER — Ambulatory Visit (INDEPENDENT_AMBULATORY_CARE_PROVIDER_SITE_OTHER): Payer: Medicare HMO

## 2013-12-16 ENCOUNTER — Ambulatory Visit (INDEPENDENT_AMBULATORY_CARE_PROVIDER_SITE_OTHER): Payer: Commercial Managed Care - HMO | Admitting: Orthopedic Surgery

## 2013-12-16 VITALS — BP 103/65 | Ht 64.0 in | Wt 167.0 lb

## 2013-12-16 DIAGNOSIS — S62109A Fracture of unspecified carpal bone, unspecified wrist, initial encounter for closed fracture: Secondary | ICD-10-CM

## 2013-12-16 NOTE — Patient Instructions (Signed)
The fracture the wrist is stabilized and the cast can be removed.   Recommend splinting to assist with ambulation with a walker

## 2013-12-16 NOTE — Progress Notes (Signed)
Patient ID: Holly Sloan, female   DOB: 1929/03/04, 80 y.o.   MRN: [id number] Chief Complaint  Patient presents with  . Follow-up    Recheck Right Wrist fracture with xray DOI 10/18/13    Recheck right wrist fracture with x-ray out of plaster fracture healed. Recommend splint right wrist secondary to failure of cannulated screw fixation left hip, patient awaiting referral to tertiary care Center for definitive treatment  X-ray looks good followup as needed

## 2013-12-17 ENCOUNTER — Telehealth: Payer: Self-pay | Admitting: Orthopedic Surgery

## 2013-12-17 NOTE — Telephone Encounter (Signed)
Received call from physical therapist, Myriam Jacobson, at [facility].  Question regarding therapy, wrist, and also regarding weight-bearing status.  Ph# [phone].

## 2013-12-17 NOTE — Telephone Encounter (Signed)
Faxed office note from Dr. Aline Brochure to [facility], which indicated weight bearing status.

## 2013-12-19 ENCOUNTER — Telehealth: Payer: Self-pay | Admitting: *Deleted

## 2013-12-19 NOTE — Telephone Encounter (Signed)
Myriam Jacobson, PT, with [facility], called and requested weight bearing status for right upper extremity and Range of Motion for patient.Please advise.

## 2013-12-19 NOTE — Telephone Encounter (Signed)
UNNECESSARY

## 2013-12-22 ENCOUNTER — Telehealth: Payer: Self-pay | Admitting: Orthopedic Surgery

## 2013-12-22 NOTE — Telephone Encounter (Signed)
Call received from [facility]'s assistant director of nursing, [place], ph# [phone], to relay, first, that the facility has heard from Dr Alvan Dame directly, and states that the patient needs to go to the Emergency Room at [facility], then be admitted.  Secondly, the facility said they still have been unable to do therapy on the wrist without further information (per previous note 12/17/13-12/19/13).  I relayed that I will forward message to our nurse.  Please call back to facility and ask for Jarrett Soho, or for Maryelizabeth Kaufmann, Nurse practitioner, as Jenny Reichmann, Surveyor, quantity of nursing  states that she will be out of office most of day today.

## 2013-12-22 NOTE — Telephone Encounter (Signed)
agree

## 2013-12-22 NOTE — Telephone Encounter (Signed)
Spoke with Orvil Feil, and advised that we had referred patient to DR. Alvan Dame and if it was his recommendation to take patient to ED then that is what they needed to do.

## 2014-01-01 ENCOUNTER — Observation Stay (HOSPITAL_COMMUNITY): Payer: Medicare HMO

## 2014-01-01 ENCOUNTER — Observation Stay (HOSPITAL_COMMUNITY)
Admission: AD | Admit: 2014-01-01 | Discharge: 2014-01-02 | Disposition: A | Discharge status: Skilled Nursing Facility | Payer: Medicare HMO | Source: Ambulatory Visit | Attending: Internal Medicine | Admitting: Internal Medicine

## 2014-01-01 ENCOUNTER — Encounter (HOSPITAL_COMMUNITY): Payer: Self-pay | Admitting: *Deleted

## 2014-01-01 DIAGNOSIS — I10 Essential (primary) hypertension: Secondary | ICD-10-CM | POA: Insufficient documentation

## 2014-01-01 DIAGNOSIS — K219 Gastro-esophageal reflux disease without esophagitis: Secondary | ICD-10-CM | POA: Insufficient documentation

## 2014-01-01 DIAGNOSIS — Z87442 Personal history of urinary calculi: Secondary | ICD-10-CM | POA: Insufficient documentation

## 2014-01-01 DIAGNOSIS — M87059 Idiopathic aseptic necrosis of unspecified femur: Secondary | ICD-10-CM | POA: Insufficient documentation

## 2014-01-01 DIAGNOSIS — E039 Hypothyroidism, unspecified: Secondary | ICD-10-CM | POA: Insufficient documentation

## 2014-01-01 DIAGNOSIS — M25552 Pain in left hip: Secondary | ICD-10-CM

## 2014-01-01 DIAGNOSIS — I509 Heart failure, unspecified: Secondary | ICD-10-CM | POA: Insufficient documentation

## 2014-01-01 DIAGNOSIS — I82409 Acute embolism and thrombosis of unspecified deep veins of unspecified lower extremity: Secondary | ICD-10-CM

## 2014-01-01 DIAGNOSIS — M87052 Idiopathic aseptic necrosis of left femur: Secondary | ICD-10-CM | POA: Diagnosis present

## 2014-01-01 DIAGNOSIS — Z7901 Long term (current) use of anticoagulants: Secondary | ICD-10-CM | POA: Insufficient documentation

## 2014-01-01 DIAGNOSIS — Z86718 Personal history of other venous thrombosis and embolism: Principal | ICD-10-CM | POA: Insufficient documentation

## 2014-01-01 DIAGNOSIS — Z79899 Other long term (current) drug therapy: Secondary | ICD-10-CM | POA: Insufficient documentation

## 2014-01-01 LAB — COMPREHENSIVE METABOLIC PANEL
ALBUMIN: 1.5 g/dL — AB (ref 3.5–5.2)
ALT: 11 U/L (ref 0–35)
AST: 13 U/L (ref 0–37)
Alkaline Phosphatase: 131 U/L — ABNORMAL HIGH (ref 39–117)
BUN: 56 mg/dL — ABNORMAL HIGH (ref 6–23)
CO2: 25 mEq/L (ref 19–32)
CREATININE: 0.95 mg/dL (ref 0.50–1.10)
Calcium: 10 mg/dL (ref 8.4–10.5)
Chloride: 101 mEq/L (ref 96–112)
GFR calc Af Amer: 63 mL/min — ABNORMAL LOW (ref >90)
GFR, EST NON AFRICAN AMERICAN: 54 mL/min — AB (ref >90)
Glucose, Bld: 70 mg/dL (ref 70–99)
Potassium: 3.9 mEq/L (ref 3.7–5.3)
SODIUM: 142 meq/L (ref 137–147)
TOTAL PROTEIN: 4.3 g/dL — AB (ref 6.0–8.3)
Total Bilirubin: <0.2 mg/dL — ABNORMAL LOW (ref 0.3–1.2)

## 2014-01-01 LAB — APTT: APTT: 26 s (ref 24–37)

## 2014-01-01 LAB — TSH: TSH: 3.68 u[IU]/mL (ref 0.350–4.500)

## 2014-01-01 LAB — CBC WITH DIFFERENTIAL/PLATELET
Basophils Absolute: 0.1 10*3/uL (ref 0.0–0.1)
Basophils Relative: 1 % (ref 0–1)
EOS PCT: 1 % (ref 0–5)
Eosinophils Absolute: 0.1 10*3/uL (ref 0.0–0.7)
HEMATOCRIT: 42.2 % (ref 36.0–46.0)
HEMOGLOBIN: 13.9 g/dL (ref 12.0–15.0)
LYMPHS PCT: 27 % (ref 12–46)
Lymphs Abs: 2.2 10*3/uL (ref 0.7–4.0)
MCH: 29.2 pg (ref 26.0–34.0)
MCHC: 32.9 g/dL (ref 30.0–36.0)
MCV: 88.7 fL (ref 78.0–100.0)
MONOS PCT: 10 % (ref 3–12)
Monocytes Absolute: 0.8 10*3/uL (ref 0.1–1.0)
Neutro Abs: 4.9 10*3/uL (ref 1.7–7.7)
Neutrophils Relative %: 61 % (ref 43–77)
PLATELETS: 369 10*3/uL (ref 150–400)
RBC: 4.76 MIL/uL (ref 3.87–5.11)
RDW: 17.1 % — ABNORMAL HIGH (ref 11.5–15.5)
WBC: 8 10*3/uL (ref 4.0–10.5)

## 2014-01-01 LAB — PROTIME-INR
INR: 0.96 (ref 0.00–1.49)
Prothrombin Time: 12.6 seconds (ref 11.6–15.2)

## 2014-01-01 MED ORDER — POTASSIUM CHLORIDE CRYS ER 20 MEQ PO TBCR
20.0000 meq | EXTENDED_RELEASE_TABLET | Freq: Every day | ORAL | Status: DC
  Filled 2014-01-01: qty 1

## 2014-01-01 MED ORDER — SODIUM CHLORIDE 0.9 % IV SOLN: 250.0000 mL | INTRAVENOUS | Status: DC | PRN

## 2014-01-01 MED ORDER — PRO-STAT SUGAR FREE PO LIQD
30.0000 mL | Freq: Four times a day (QID) | ORAL | Status: DC
  Filled 2014-01-01 (×3): qty 30

## 2014-01-01 MED ORDER — SENNA-DOCUSATE SODIUM 8.6-50 MG PO TABS
1.0000 | ORAL_TABLET | Freq: Two times a day (BID) | ORAL | Status: DC
  Filled 2014-01-01 (×2): qty 1

## 2014-01-01 MED ORDER — GABAPENTIN 300 MG PO CAPS
300.0000 mg | ORAL_CAPSULE | Freq: Three times a day (TID) | ORAL | Status: DC
  Administered 2014-01-01 – 2014-01-02 (×3): 300 mg via ORAL
  Filled 2014-01-01 (×5): qty 1

## 2014-01-01 MED ORDER — TRAMADOL HCL 50 MG PO TABS: 50.0000 mg | ORAL_TABLET | Freq: Four times a day (QID) | ORAL | Status: DC | PRN

## 2014-01-01 MED ORDER — MIDAZOLAM HCL 2 MG/2ML IJ SOLN
INTRAMUSCULAR | Status: AC
  Filled 2014-01-01: qty 4

## 2014-01-01 MED ORDER — SODIUM CHLORIDE 0.9 % IJ SOLN: 3.0000 mL | INTRAMUSCULAR | Status: DC | PRN

## 2014-01-01 MED ORDER — SPIRONOLACTONE 25 MG PO TABS
25.0000 mg | ORAL_TABLET | Freq: Every day | ORAL | Status: DC
  Filled 2014-01-01: qty 1

## 2014-01-01 MED ORDER — AMLODIPINE BESYLATE 2.5 MG PO TABS
2.5000 mg | ORAL_TABLET | Freq: Every day | ORAL | Status: DC
  Filled 2014-01-01: qty 1

## 2014-01-01 MED ORDER — MIDAZOLAM HCL 2 MG/2ML IJ SOLN
INTRAMUSCULAR | Status: AC | PRN
  Administered 2014-01-01: 0.5 mg via INTRAVENOUS

## 2014-01-01 MED ORDER — SODIUM CHLORIDE 0.9 % IJ SOLN
3.0000 mL | Freq: Two times a day (BID) | INTRAMUSCULAR | Status: DC
  Administered 2014-01-01 – 2014-01-02 (×2): 3 mL via INTRAVENOUS

## 2014-01-01 MED ORDER — CYANOCOBALAMIN 500 MCG PO TABS
500.0000 ug | ORAL_TABLET | Freq: Every day | ORAL | Status: DC
  Administered 2014-01-01 – 2014-01-02 (×2): 500 ug via ORAL
  Filled 2014-01-01 (×2): qty 1

## 2014-01-01 MED ORDER — FENTANYL CITRATE 0.05 MG/ML IJ SOLN
INTRAMUSCULAR | Status: AC | PRN
  Administered 2014-01-01: 25 ug via INTRAVENOUS

## 2014-01-01 MED ORDER — SENNA-DOCUSATE SODIUM 8.6-50 MG PO TABS
1.0000 | ORAL_TABLET | Freq: Two times a day (BID) | ORAL | Status: DC
  Filled 2014-01-01 (×3): qty 1

## 2014-01-01 MED ORDER — RISAQUAD PO CAPS
1.0000 | ORAL_CAPSULE | Freq: Every day | ORAL | Status: DC
  Filled 2014-01-01: qty 1

## 2014-01-01 MED ORDER — FUROSEMIDE 40 MG PO TABS
40.0000 mg | ORAL_TABLET | Freq: Every day | ORAL | Status: DC
  Administered 2014-01-01: 40 mg via ORAL
  Filled 2014-01-01 (×2): qty 1

## 2014-01-01 MED ORDER — GABAPENTIN 300 MG PO CAPS
300.0000 mg | ORAL_CAPSULE | Freq: Three times a day (TID) | ORAL | Status: DC
  Filled 2014-01-01 (×3): qty 1

## 2014-01-01 MED ORDER — VITAMIN C 500 MG PO TABS
500.0000 mg | ORAL_TABLET | Freq: Two times a day (BID) | ORAL | Status: DC
  Filled 2014-01-01 (×2): qty 1

## 2014-01-01 MED ORDER — AMLODIPINE BESYLATE 2.5 MG PO TABS
2.5000 mg | ORAL_TABLET | Freq: Every day | ORAL | Status: DC
  Administered 2014-01-01: 2.5 mg via ORAL
  Filled 2014-01-01 (×2): qty 1

## 2014-01-01 MED ORDER — CYANOCOBALAMIN 500 MCG PO TABS
500.0000 ug | ORAL_TABLET | Freq: Every day | ORAL | Status: DC
  Filled 2014-01-01: qty 1

## 2014-01-01 MED ORDER — LIDOCAINE HCL 1 % IJ SOLN
INTRAMUSCULAR | Status: AC
  Filled 2014-01-01: qty 20

## 2014-01-01 MED ORDER — SPIRONOLACTONE 25 MG PO TABS
25.0000 mg | ORAL_TABLET | Freq: Every day | ORAL | Status: DC
  Administered 2014-01-01: 25 mg via ORAL
  Filled 2014-01-01 (×2): qty 1

## 2014-01-01 MED ORDER — VITAMIN D3 25 MCG (1000 UNIT) PO TABS
1000.0000 [IU] | ORAL_TABLET | Freq: Every day | ORAL | Status: DC
  Filled 2014-01-01: qty 1

## 2014-01-01 MED ORDER — POTASSIUM CHLORIDE CRYS ER 20 MEQ PO TBCR
20.0000 meq | EXTENDED_RELEASE_TABLET | Freq: Every day | ORAL | Status: DC
  Administered 2014-01-01 – 2014-01-02 (×2): 20 meq via ORAL
  Filled 2014-01-01 (×2): qty 1

## 2014-01-01 MED ORDER — VITAMIN C 500 MG PO TABS
500.0000 mg | ORAL_TABLET | Freq: Two times a day (BID) | ORAL | Status: DC
  Administered 2014-01-01 – 2014-01-02 (×2): 500 mg via ORAL
  Filled 2014-01-01 (×3): qty 1

## 2014-01-01 MED ORDER — FUROSEMIDE 80 MG PO TABS
80.0000 mg | ORAL_TABLET | Freq: Every day | ORAL | Status: DC
  Administered 2014-01-02: 80 mg via ORAL
  Filled 2014-01-01 (×3): qty 1

## 2014-01-01 MED ORDER — RISAQUAD PO CAPS
1.0000 | ORAL_CAPSULE | Freq: Every day | ORAL | Status: DC
  Administered 2014-01-01 – 2014-01-02 (×2): 1 via ORAL
  Filled 2014-01-01 (×2): qty 1

## 2014-01-01 MED ORDER — VITAMIN D3 25 MCG (1000 UNIT) PO TABS
1000.0000 [IU] | ORAL_TABLET | Freq: Every day | ORAL | Status: DC
Start: 2014-01-01 — End: 2014-01-02
  Administered 2014-01-01 – 2014-01-02 (×2): 1000 [IU] via ORAL
  Filled 2014-01-01 (×2): qty 1

## 2014-01-01 MED ORDER — PANTOPRAZOLE SODIUM 40 MG PO TBEC
40.0000 mg | DELAYED_RELEASE_TABLET | Freq: Two times a day (BID) | ORAL | Status: DC
  Administered 2014-01-01 – 2014-01-02 (×2): 40 mg via ORAL
  Filled 2014-01-01 (×5): qty 1

## 2014-01-01 MED ORDER — FENTANYL CITRATE 0.05 MG/ML IJ SOLN
INTRAMUSCULAR | Status: AC
  Filled 2014-01-01: qty 2

## 2014-01-01 MED ORDER — LEVOTHYROXINE SODIUM 25 MCG PO TABS
25.0000 ug | ORAL_TABLET | Freq: Every morning | ORAL | Status: DC
  Administered 2014-01-02: 25 ug via ORAL
  Filled 2014-01-01 (×2): qty 1

## 2014-01-01 MED ORDER — FERROUS SULFATE 325 (65 FE) MG PO TABS
325.0000 mg | ORAL_TABLET | Freq: Two times a day (BID) | ORAL | Status: DC
  Administered 2014-01-01 – 2014-01-02 (×2): 325 mg via ORAL
  Filled 2014-01-01 (×4): qty 1

## 2014-01-01 MED ORDER — PRO-STAT SUGAR FREE PO LIQD
30.0000 mL | Freq: Four times a day (QID) | ORAL | Status: DC
  Administered 2014-01-01 – 2014-01-02 (×4): 30 mL via ORAL
  Filled 2014-01-01 (×6): qty 30

## 2014-01-01 MED ORDER — IOHEXOL 300 MG/ML  SOLN: 50.0000 mL | Freq: Once | INTRAMUSCULAR | Status: AC | PRN

## 2014-01-01 MED ORDER — FUROSEMIDE 40 MG PO TABS
40.0000 mg | ORAL_TABLET | Freq: Two times a day (BID) | ORAL | Status: DC
Start: 2014-01-01 — End: 2014-01-01

## 2014-01-01 NOTE — Discharge Instructions (Signed)
Inferior Vena Cava Filter Insertion Insertion of an inferior vena cava (IVC) filter is a procedure in which a filter is placed into the large vein in your abdomen that carries blood from the lower part of your body to your heart (inferior vena cava). Placement of the filter here helps prevent blood clots in the legs or pelvis from traveling to your lungs. A large blood clot in the lungs can cause death.  The filter is a small, metal device about an inch long. It is shaped like the spokes of an umbrella and is inserted through a pathway created in your neck or groin. The risks of this procedure are usually small and easily managed. Inferior vena cava filters are only used when blood thinners (anticoagulants) cannot be used to prevent blood clots from forming. This may occur because of:  You have severe platelet problems or shortage.  You have had recent or current major bleeding that cannot be treated.  You have bleeding associated with anticoagulants.  You have recurrence of blood clots while on anticoagulants.  You have a need for surgery in the near future.  You have bleeding in your head. EXPECTATIONS OF A FILTER  An IVC filter will reduce the risk of a large blood clot making its way to your lungs (pulmonary embolus, or PE). It cannot eliminate the risk completely, or prevent small PEs from occurring.  It does not stop blood clots from growing, recurring, or developing into postphlebitic syndrome. This is a condition that can occur after there is inflammation in a vein (phlebitis). LET [facility] CARE PROVIDER KNOW ABOUT:  Any allergies you have. This includes an allergy to iodine or contrast dye.  All medicines you are taking, including vitamins, herbs, eye drops, creams and over-the-counter medicines.  Previous problems you or members of your family have had with the use of anesthetics.  Any blood disorders you have.  Previous surgeries you had had.  Medical conditions you  have.  Possibility of pregnancy, if this applies. RISKS AND COMPLICATIONS Generally, this is a safe procedure. However, as with any procedure, problems can occur. Possible problems include:  A small bruise around the needle insertion site. A larger pooling of blood called a hematoma may form. This is usually of no concern.  The filter can block the vena cava. This can cause some swelling of the legs.  The filter may eventually fail and not work properly.  Continued bleeding or infections (uncommon).  Damage to the vein by the catheter (rare). BEFORE THE PROCEDURE  Your health care provider may want you to have blood tests. These tests can help tell how well your kidneys and liver are working. They can also show how well your blood clots.  If you take blood thinners, ask your health care provider if and when you should stop taking them.  Do not eat or drink for 4 hours before the procedure or as directed by your health care provider.  Make arrangements for someone to drive you home. Depending on the procedure, you may be able to go home the same day.  PROCEDURE   The procedure usually takes about 30 minutes to 1 hour. This can vary.  An IV needle will be inserted into one of your veins. Medicine will be able to flow directly into your body through this needle.  Medicines may given to help you relax and relieve anxiety (sedative).  The procedure is done through a large vein either in your neck or groin. The  skin around this area is cleaned and shaved, as necessary.  The skin and deeper tissues over the vein will be made numb with a local anesthetic. You are awake during the procedure and can let your health care providers know if you have discomfort.  A needle is then put into the vein. A guide wire is placed through the needle and into the vein. This is used to help insert a catheter and the IVC filter into your vein.  Contrast dye may be injected into the inferior vena cava to  help guide the catheter and verify precise placement of the IVC filter. X-ray equipment may also be used to verify that the catheter and the wire are in the correct position.  The wire is then withdrawn.  The IVC filter is then passed over the catheter into the vein, and inserted into the correct location in the vena cava.  The catheter is then removed. Pressure will be kept on the needle insertion point for several minutes or until it is unlikely to bleed. AFTER THE PROCEDURE  You will stay in a recovery area until any sedation medicine you were given has worn off. Your blood pressure and pulse will be checked.  If there are no problems, you should be able to go home after the procedure.  You may feel sore at the area of the needle insertion for a few days. Document Released: 09/20/2005 Document Revised: 05/21/2013 Document Reviewed: 04/07/2013 [facility] Patient Information 2014 [place].

## 2014-01-01 NOTE — Consult Note (Signed)
Reason for Consult: Left Hip Pain  Referring Physician: Dr. [name]  Holly Sloan is an 80 y.o. female.  HPI: Holly Sloan admitted to [facility] today for continued left hip pain that has been ongoing for many months and placement of an IVC filter.  She had a fall and fracture of the left femoral neck back in the fall of 2012 requiring left hip pinning utilizing three cannulated screws performed by Dr. Arther Abbott.  She initially did well following the fixation of the fracture but developed pain this past summer that continued to progress over time.  She had been living her on of her daughter.  She unfortunately was admitted to [facility] earlier this year in March for rectal bleeding.  Near the end of the hospitalization, she told the staff about her ongoing hip pain and an x-ray was taken on 10/30/2013.  It showed findings consistent with AVN of the left femoral head and collapse. She followed up with Dr. Aline Brochure concerning the AVN.  It was felt at that time she would require hardware removal and conversion to a bipolar or total hip replacement.  It was also recommended that she be referred to a tertiary center due to her complex medical history. She was referred to Dr. Adriana Mccallum and had been awaiting an office visit to discuss further care.  She recently had been recovering at [facility] for the past seven weeks following her previous admission to [facility].  Her care and mobility have also been compounded by a right wrist fracture during this time. She was brought into [facility] hospital due to the recommendation by Dr. Alvan Dame that she would need an IVC filter placed prior to any surgery that could be performed.  She is scheduled to receive the filter hopefully some time this afternoon or tomorrow. The office was notified of her admission and asked for consult.  Dr. Wynelle Link is on call today covering for [place].  The patient is seen today accompanied her two daughters, Holly Sloan and Holly Sloan.  Past  Medical History  Diagnosis Date  . CHF (congestive heart failure)   . Hypertension   . Arthritis   . Kidney stone   . Pneumonia     20 years ago  . DVT (deep venous thrombosis) 10/14/13    left leg  . Fracture of wrist 10/18/13    right  . Hip fracture, left   . Deep vein blood clot of left lower extremity   . Hemorrhoids     large per colonoscopy 3/15  . Hiatal hernia     per endo 3/15  . Erosive gastritis     mild per endo 3/15    Past Surgical History  Procedure Laterality Date  . Appendectomy      as teenager  . Kidney stone surgery  jan 2012    stone remopved left side  . Hip pinning,cannulated  05/23/2011    Procedure: CANNULATED HIP PINNING;  Surgeon: Arther Abbott, MD;  Location: [facility];  Service: Orthopedics;  Laterality: Left;  late entry: updated foley documentation  . Hip open reduction Left 2012  . Esophagogastroduodenoscopy  April 2003    Dr. Gala Romney: 2 polyps proximal stomach, moderate sized hiatal hernia  . Colonoscopy  April 2003    Dr. Gala Romney: left-sided transverse diverticula  . Colonoscopy with esophagogastroduodenoscopy (egd) N/A 10/28/2013    Procedure: COLONOSCOPY WITH ESOPHAGOGASTRODUODENOSCOPY (EGD);  Surgeon: Danie Binder, MD;  Location: [facility];  Service: Endoscopy;  Laterality:  N/A;    Family History  Problem Relation Age of Onset  . Anesthesia problems Neg Hx   . Hypotension Neg Hx   . Malignant hyperthermia Neg Hx   . Pseudochol deficiency Neg Hx   . Colon cancer Neg Hx     Social History:  reports that she has never smoked. She has never used smokeless tobacco. She reports that she does not drink alcohol or use illicit drugs.  Allergies: No Known Allergies  Home Medications:  Acidophilus Prostat Feeding Supplement Norvasc Vitamin C Vitamin D Cyanocobalamin Ferrous Sulfate Lasix Neurontin Levothyroxine Protonix Potassium Senokot Aldactone Ultram Xarelto 20 mg  Results for orders placed during the hospital encounter  of 01/01/14 (from the past 48 hour(s))  COMPREHENSIVE METABOLIC PANEL     Status: Abnormal   Collection Time    01/01/14 10:50 AM      Result Value Ref Range   Sodium 142  137 - 147 mEq/L   Potassium 3.9  3.7 - 5.3 mEq/L   Chloride 101  96 - 112 mEq/L   CO2 25  19 - 32 mEq/L   Glucose, Bld 70  70 - 99 mg/dL   BUN 56 (*) 6 - 23 mg/dL   Creatinine, Ser 1.61  0.50 - 1.10 mg/dL   Calcium 09.6  8.4 - 04.5 mg/dL   Total Protein 4.3 (*) 6.0 - 8.3 g/dL   Albumin 1.5 (*) 3.5 - 5.2 g/dL   AST 13  0 - 37 U/L   ALT 11  0 - 35 U/L   Alkaline Phosphatase 131 (*) 39 - 117 U/L   Total Bilirubin <0.2 (*) 0.3 - 1.2 mg/dL   GFR calc non Af Amer 54 (*) >90 mL/min   GFR calc Af Amer 63 (*) >90 mL/min   Comment: (NOTE)     The eGFR has been calculated using the CKD EPI equation.     This calculation has not been validated in all clinical situations.     eGFR's persistently <90 mL/min signify possible Chronic Kidney     Disease.  CBC WITH DIFFERENTIAL     Status: Abnormal   Collection Time    01/01/14 10:50 AM      Result Value Ref Range   WBC 8.0  4.0 - 10.5 K/uL   RBC 4.76  3.87 - 5.11 MIL/uL   Hemoglobin 13.9  12.0 - 15.0 g/dL   HCT 40.9  81.1 - 91.4 %   MCV 88.7  78.0 - 100.0 fL   MCH 29.2  26.0 - 34.0 pg   MCHC 32.9  30.0 - 36.0 g/dL   RDW 78.2 (*) 95.6 - 21.3 %   Platelets 369  150 - 400 K/uL   Neutrophils Relative % 61  43 - 77 %   Neutro Abs 4.9  1.7 - 7.7 K/uL   Lymphocytes Relative 27  12 - 46 %   Lymphs Abs 2.2  0.7 - 4.0 K/uL   Monocytes Relative 10  3 - 12 %   Monocytes Absolute 0.8  0.1 - 1.0 K/uL   Eosinophils Relative 1  0 - 5 %   Eosinophils Absolute 0.1  0.0 - 0.7 K/uL   Basophils Relative 1  0 - 1 %   Basophils Absolute 0.1  0.0 - 0.1 K/uL  APTT     Status: None   Collection Time    01/01/14 10:50 AM      Result Value Ref Range   aPTT 26  24 -  37 seconds  PROTIME-INR     Status: None   Collection Time    01/01/14 10:50 AM      Result Value Ref Range   Prothrombin  Time 12.6  11.6 - 15.2 seconds   INR 0.96  0.00 - 1.49    No results found.  Review of Systems  Constitutional: Negative.   Respiratory: Negative.   Cardiovascular: Negative.   Genitourinary: Negative.   Musculoskeletal: Positive for joint pain (Left hip and groin pain).   Blood pressure 100/60, pulse 104, temperature 98.1 F (36.7 C), temperature source Oral, resp. rate 20, height $RemoveBe'5\' 4"'HYlFhCTkB$  (1.626 m), weight 75.751 kg (167 lb). Physical Exam  Musculoskeletal:       Left hip: She exhibits decreased range of motion.       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  Plan - Patient is for placement of an IVC filter today or tomorrow.  I have discussed the findings noted on her x-rays which were taken on 10/30/2013 at [facility].  She has gone on to develop AVN of the left hip.  She understands that she will likely require hip arthroplasty following hardware removal.  She unfortunately has significant medical history that increases her risk with the probable surgery.  I briefly discussed some of these risks with the patient and her two daughters at the bedside.  She was originally awaiting an office visit with Dr. Alvan Dame which was pending at the time of her admission.  The patient and family seem very appreciative of the visit today due to the pain and lack of mobility of Holly Sloan.  She is looking forward to getting something done and getting her function back.  Arlee Muslim 01/01/2014, 12:18 PM     I have reviewed her history and saw and examined the patient. 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 She was to see Dr. Alvan Dame in the office for consultation on 6/12. Since she is in the hospital today for her IVC filter we were consulted to try and facilitate the process. She will need a total hip arthroplasty to treat this problem. I am fully scheduled for hip replacements until early September. I will check with Dr.olin tomorrow to see if he can see the patient prior to her scheduled appointment on 6/12 and if surgery can be arranged in near future.

## 2014-01-01 NOTE — H&P (Signed)
Holly Sloan is an 80 y.o. female.   Chief Complaint: L hip pain HPI: 80 yo female with hx of L hip fracture 2012 s/p hardware, and now has c/o L hip pain,  Xray by Arther Abbott => avascular necrosis of the left hip and impingement of hardware.  Dr. Aline Brochure thought that she would be better serve by referal to tertiary care center.  Pt has been referred to [facility].  Dr. Alvan Dame apparently recommended IVC filter prior to any operation due to hx of blood clot  (LE DVT) x2.  Pt is on xarelto.  Last dose about 6pm last nite.  Pt was unable to get to her appt at [facility] so has not yet seen anyone over there.  Pt admitted for IVC filter placement to reduce risk of PE while undergoing surgery for the left hip AVN.     Past Medical History  Diagnosis Date  . CHF (congestive heart failure)   . Hypertension   . Arthritis   . Kidney stone   . Pneumonia     20 years ago  . DVT (deep venous thrombosis) 10/14/13    left leg  . Fracture of wrist 10/18/13    right  . Hip fracture, left   . Deep vein blood clot of left lower extremity   . Hemorrhoids     large per colonoscopy 3/15  . Hiatal hernia     per endo 3/15  . Erosive gastritis     mild per endo 3/15    Past Surgical History  Procedure Laterality Date  . Appendectomy      as teenager  . Kidney stone surgery  jan 2012    stone remopved left side  . Hip pinning,cannulated  05/23/2011    Procedure: CANNULATED HIP PINNING;  Surgeon: Arther Abbott, MD;  Location: [facility] ORS;  Service: Orthopedics;  Laterality: Left;  late entry: updated foley documentation  . Hip open reduction Left 2012  . Esophagogastroduodenoscopy  April 2003    Dr. Gala Romney: 2 polyps proximal stomach, moderate sized hiatal hernia  . Colonoscopy  April 2003    Dr. Gala Romney: left-sided transverse diverticula  . Colonoscopy with esophagogastroduodenoscopy (egd) N/A 10/28/2013    Procedure: COLONOSCOPY WITH ESOPHAGOGASTRODUODENOSCOPY (EGD);   Surgeon: Danie Binder, MD;  Location: [facility];  Service: Endoscopy;  Laterality: N/A;    Family History  Problem Relation Age of Onset  . Anesthesia problems Neg Hx   . Hypotension Neg Hx   . Malignant hyperthermia Neg Hx   . Pseudochol deficiency Neg Hx   . Colon cancer Neg Hx    Social History:  reports that she has never smoked. She has never used smokeless tobacco. She reports that she does not drink alcohol or use illicit drugs.  Allergies: No Known Allergies  Medications Prior to Admission  Medication Sig Dispense Refill  . acidophilus (RISAQUAD) CAPS capsule Take 1 capsule by mouth daily.      . Amino Acids-Protein Hydrolys (FEEDING SUPPLEMENT, PRO-STAT SUGAR FREE 64,) LIQD Take 30 mLs by mouth 4 (four) times daily.      Marland Kitchen amLODipine (NORVASC) 2.5 MG tablet Take 2.5 mg by mouth daily.       Marland Kitchen ascorbic acid (VITAMIN C) 500 MG tablet Take 500 mg by mouth 2 (two) times daily.      . cholecalciferol (VITAMIN D) 1000 UNITS tablet Take 1,000 Units by mouth daily.      . cyanocobalamin 500 MCG tablet Take 500  mcg by mouth daily.      . ferrous sulfate 325 (65 FE) MG tablet Take 325 mg by mouth 2 (two) times daily with a meal.      . furosemide (LASIX) 40 MG tablet Take 40-80 mg by mouth 2 (two) times daily. 80mg  in the am and 40mg  in the evening      . gabapentin (NEURONTIN) 300 MG capsule Take 300 mg by mouth 3 (three) times daily.      Marland Kitchen levothyroxine (SYNTHROID, LEVOTHROID) 25 MCG tablet Take 25 mcg by mouth every morning.       . pantoprazole (PROTONIX) 40 MG tablet Take 1 tablet (40 mg total) by mouth 2 (two) times daily before a meal.      . potassium chloride SA (K-DUR,KLOR-CON) 20 MEQ tablet Take 20 mEq by mouth daily.      . Rivaroxaban (XARELTO) 20 MG TABS tablet Take one tab daily starting 11/05/13.  30 tablet    . sennosides-docusate sodium (SENOKOT-S) 8.6-50 MG tablet Take 1 tablet by mouth 2 (two) times daily.      Marland Kitchen spironolactone (ALDACTONE) 25 MG tablet Take 25  mg by mouth daily.      . traMADol (ULTRAM) 50 MG tablet Take 1 tablet (50 mg total) by mouth every 6 (six) hours as needed.  15 tablet  0    No results found for this or any previous visit (from the past 48 hour(s)). No results found.  ROS:  Negative for all organ systems except for + above,  Particularly no cp, no palp, no sob.   Blood pressure 100/60, pulse 104, temperature 98.1 F (36.7 C), temperature source Oral, resp. rate 20, height 5\' 4"  (1.626 m), weight 167 lb (75.751 kg). Physical Exam   Heent: anicteric Neck: no jvd, no bruit, no tm, Heart: rrr s1, s2 Lung: ctab Abd: soft, nt, nd, +bs Ext: no c/c/e Skin: no rash Lymph: no adenoapthy Neuro: nonfocal   Assessment/Plan L hip pain:  Secondary to AVN,  We have consulted [facility] to come and evaluate the patient for AVN as well as hardware impingement IVC filter placement by IR today or tomorrow.   Hold xarelto for now.  Can restart after IVC filter placement.   DVT x2 (prior miscarriages):  Pt will need anticoagulation for life  Hypothryoidism:  Cont levothyroxine  Gerd:  Cont protonix  CHF:  Continue spironolactone, and furosemide.    Hypertension:  Continue on amlodipine.    Dispo: check cbc, cmp, pt, ptt today, referal to IR of IVC filter,  Appreciate orthopedic input.  Thanks   [name] 01/01/2014, 9:17 AM

## 2014-01-01 NOTE — Progress Notes (Signed)
Patient ID: Holly Sloan, female   DOB: 1929/04/10, 80 y.o.   MRN: [id number] Request received for placement of an IVC filter in pt with prior hx of LLE DVT, most recently in 10/2013 currently on xarelto(held for this admission) and plans for upcoming left hip surgery for AVN. Additional PMH as below. Exam: pt awake/alert; chest- sl dim BS bases; heart- RRR; abd- soft,+BS,NT; ext- rt wrist fx; trace LE edema, decreased ROM left hip.   Filed Vitals:   01/01/14 0817  BP: 100/60  Pulse: 104  Temp: 98.1 F (36.7 C)  TempSrc: Oral  Resp: 20  Height: 5\' 4"  (1.626 m)  Weight: 167 lb (75.751 kg)   Past Medical History  Diagnosis Date  . CHF (congestive heart failure)   . Hypertension   . Arthritis   . Kidney stone   . Pneumonia     20 years ago  . DVT (deep venous thrombosis) 10/14/13    left leg  . Fracture of wrist 10/18/13    right  . Hip fracture, left   . Deep vein blood clot of left lower extremity   . Hemorrhoids     large per colonoscopy 3/15  . Hiatal hernia     per endo 3/15  . Erosive gastritis     mild per endo 3/15   Past Surgical History  Procedure Laterality Date  . Appendectomy      as teenager  . Kidney stone surgery  jan 2012    stone remopved left side  . Hip pinning,cannulated  05/23/2011    Procedure: CANNULATED HIP PINNING;  Surgeon: Arther Abbott, MD;  Location: [facility];  Service: Orthopedics;  Laterality: Left;  late entry: updated foley documentation  . Hip open reduction Left 2012  . Esophagogastroduodenoscopy  April 2003    Dr. Gala Romney: 2 polyps proximal stomach, moderate sized hiatal hernia  . Colonoscopy  April 2003    Dr. Gala Romney: left-sided transverse diverticula  . Colonoscopy with esophagogastroduodenoscopy (egd) N/A 10/28/2013    Procedure: COLONOSCOPY WITH ESOPHAGOGASTRODUODENOSCOPY (EGD);  Surgeon: Danie Binder, MD;  Location: [facility];  Service: Endoscopy;  Laterality: N/A;   Dg Wrist Complete Right  12/16/2013   Right distal radius  fracture treated with cast  Followup x-rays  The fracture has slight dorsal angulation stable approximately 5. There  is usual settling with slight ulnar prominence. Overall radial angulation  is flattened. Fracture appears to be stable.  Impression healed right distal radius fracture with slight dorsal  angulation. Results for orders placed during the hospital encounter of 01/01/14  COMPREHENSIVE METABOLIC PANEL      Result Value Ref Range   Sodium 142  137 - 147 mEq/L   Potassium 3.9  3.7 - 5.3 mEq/L   Chloride 101  96 - 112 mEq/L   CO2 25  19 - 32 mEq/L   Glucose, Bld 70  70 - 99 mg/dL   BUN 56 (*) 6 - 23 mg/dL   Creatinine, Ser 0.95  0.50 - 1.10 mg/dL   Calcium 10.0  8.4 - 10.5 mg/dL   Total Protein 4.3 (*) 6.0 - 8.3 g/dL   Albumin 1.5 (*) 3.5 - 5.2 g/dL   AST 13  0 - 37 U/L   ALT 11  0 - 35 U/L   Alkaline Phosphatase 131 (*) 39 - 117 U/L   Total Bilirubin <0.2 (*) 0.3 - 1.2 mg/dL   GFR calc non Af Amer 54 (*) >90 mL/min   GFR calc  Af Amer 63 (*) >90 mL/min  CBC WITH DIFFERENTIAL      Result Value Ref Range   WBC 8.0  4.0 - 10.5 K/uL   RBC 4.76  3.87 - 5.11 MIL/uL   Hemoglobin 13.9  12.0 - 15.0 g/dL   HCT 42.2  36.0 - 46.0 %   MCV 88.7  78.0 - 100.0 fL   MCH 29.2  26.0 - 34.0 pg   MCHC 32.9  30.0 - 36.0 g/dL   RDW 17.1 (*) 11.5 - 15.5 %   Platelets 369  150 - 400 K/uL   Neutrophils Relative % 61  43 - 77 %   Neutro Abs 4.9  1.7 - 7.7 K/uL   Lymphocytes Relative 27  12 - 46 %   Lymphs Abs 2.2  0.7 - 4.0 K/uL   Monocytes Relative 10  3 - 12 %   Monocytes Absolute 0.8  0.1 - 1.0 K/uL   Eosinophils Relative 1  0 - 5 %   Eosinophils Absolute 0.1  0.0 - 0.7 K/uL   Basophils Relative 1  0 - 1 %   Basophils Absolute 0.1  0.0 - 0.1 K/uL  APTT      Result Value Ref Range   aPTT 26  24 - 37 seconds  PROTIME-INR      Result Value Ref Range   Prothrombin Time 12.6  11.6 - 15.2 seconds   INR 0.96  0.00 - 1.49   A/P:Pt with prior hx of LLE DVT (x2), most recently in 10/2013  currently on xarelto(held for this admission) and plans for upcoming left hip surgery for AVN. Tent plan is for placement of an IVC filter today . Details/risks of procedure d/w pt/family with their understanding and consent.

## 2014-01-01 NOTE — Progress Notes (Signed)
Patient arrived to unit at this time accompanied by daughters via wheelchair. Alert and orientedx3. Pleasant.Placed comfortably in bed. Will continue to monitor patient.- Sandie Ano RN

## 2014-01-01 NOTE — Progress Notes (Signed)
UR completed 

## 2014-01-01 NOTE — Progress Notes (Signed)
CSW received referral that pt admitted from [facility] at [facility].  CSW visited pt room and pt currently out of room in a procedure.  CSW to follow up at a later time and complete full psychosocial assessment at that time.  Alison Murray, MSW, [facility] [employer] [phone]

## 2014-01-02 LAB — MRSA PCR SCREENING: MRSA by PCR: POSITIVE — AB

## 2014-01-02 MED ORDER — CHLORHEXIDINE GLUCONATE CLOTH 2 % EX PADS
6.0000 | MEDICATED_PAD | Freq: Every day | CUTANEOUS | Status: DC
Start: 2014-01-03 — End: 2014-01-02
  Administered 2014-01-02: 6 via TOPICAL

## 2014-01-02 MED ORDER — MUPIROCIN 2 % EX OINT
1.0000 "application " | TOPICAL_OINTMENT | Freq: Two times a day (BID) | CUTANEOUS | Status: DC
  Administered 2014-01-02: 1 via NASAL
  Filled 2014-01-02: qty 22

## 2014-01-02 NOTE — Discharge Summary (Signed)
Physician Discharge Summary  Patient ID: Holly Sloan MRN: [id number] DOB/AGE: 03-07-1929 80 y.o.  Admit date: 01/01/2014 Discharge date: 01/02/2014  Admission Diagnoses:  DVT L hip pain, L hip AVN  Discharge Diagnoses:  Active Problems:   Left hip pain   Avascular necrosis of bone of left hip DVT  Discharged Condition: stable  Hospital Course: 80 yo female with L hip pain, L hip AVN with impingement of hardware from operation in 2012, DVT,  Was c/o increased hip pain as well as needing IVC filter for prevention of PE, admitted for evaluation of L hip pain by orthopedics as well as placement of IVC filter per orthopedic request. Pt was seen by orthopedics who recommended outpatient consultation and then surgery.  DVT filter was placed by IR.  Pt 's bp appears slightly on the lower normal and therefore amlodipine held.   Pt appears stable and is ready for discharge to SNF for further rehab.   Consults: orthopedic surgery  Significant Diagnostic Studies: labs:   Treatments: IVC filter placement on 01/01/2014  Discharge Exam: Blood pressure 91/50, pulse 81, temperature 97.9 F (36.6 C), temperature source Oral, resp. rate 18, height 5\' 4"  (1.626 m), weight 167 lb (75.751 kg), SpO2 98.00%. Heent: anicteric Neck: no jvd Heart: rrr s1, s2,  Lung: ctab Abd: soft, nt, nd, +bs Ext: no c/c/e Skin: no rash, evidence of ivc filter placement from r ij Lymph: no adneopathy   Disposition: 03-Skilled Nursing Facility     Medication List    STOP taking these medications       amLODipine 2.5 MG tablet  Commonly known as:  NORVASC      TAKE these medications       acidophilus Caps capsule  Take 1 capsule by mouth daily.     ascorbic acid 500 MG tablet  Commonly known as:  VITAMIN C  Take 500 mg by mouth 2 (two) times daily.     cholecalciferol 1000 UNITS tablet  Commonly known as:  VITAMIN D  Take 1,000 Units by mouth daily.     cyanocobalamin 500 MCG tablet  Take 500  mcg by mouth daily.     feeding supplement (PRO-STAT SUGAR FREE 64) Liqd  Take 30 mLs by mouth 4 (four) times daily.     ferrous sulfate 325 (65 FE) MG tablet  Take 325 mg by mouth 2 (two) times daily with a meal.     furosemide 40 MG tablet  Commonly known as:  LASIX  Take 40-80 mg by mouth 2 (two) times daily. 80mg  in the am and 40mg  in the evening     gabapentin 300 MG capsule  Commonly known as:  NEURONTIN  Take 300 mg by mouth 3 (three) times daily.     levothyroxine 25 MCG tablet  Commonly known as:  SYNTHROID, LEVOTHROID  Take 25 mcg by mouth every morning.     pantoprazole 40 MG tablet  Commonly known as:  PROTONIX  Take 1 tablet (40 mg total) by mouth 2 (two) times daily before a meal.     potassium chloride SA 20 MEQ tablet  Commonly known as:  K-DUR,KLOR-CON  Take 20 mEq by mouth daily.     sennosides-docusate sodium 8.6-50 MG tablet  Commonly known as:  SENOKOT-S  Take 1 tablet by mouth 2 (two) times daily.     spironolactone 25 MG tablet  Commonly known as:  ALDACTONE  Take 25 mg by mouth daily.     traMADol 50 MG  tablet  Commonly known as:  ULTRAM  Take 1 tablet (50 mg total) by mouth every 6 (six) hours as needed.      ASK your doctor about these medications       rivaroxaban 20 MG Tabs tablet  Commonly known as:  XARELTO  Take one tab daily starting 11/05/13.           Follow-up Information   Follow up with [name], MD In 3 days. (pt will be seen at SNF doesn't need to come to office)    Specialty:  Internal Medicine   Contact information:   [address] [place] [address] [phone]       A/p: L hip pain/AVN:  Please call Dr. Honor Loh office to see if they can move up her appt,  DVT:  Pt s/p IVC filter placement and will be discharge to home,  Just keep R ij site clean and covered with dry dressing for the next 3 days.  Appreciate IR input, restart Xarelto today 01/02/2014, please give first dose at SNF today.   Hypertension:  Amlodipine has been discontinued due to fact that her sbp 91 this am.  Please check bp qshift and restart if bp increases.  CHF:  Pt is on lasix.  Appears slightly dehydrated,  Cont lasix and spironolactone for now,  Check cbc. cmp on 01/05/2014 Hypothyroidism:  Cont levothyroxine.  Gerd: cont protonix 40mg  poq day    Signed: [name] 01/02/2014, 11:30 AM

## 2014-01-02 NOTE — Progress Notes (Signed)
CSW received return phone call from pt daughter. Pt daughter agreeable to pt return to [facility] and CSW notified pt daughter that pt for d/c today. Pt daughter requested CSW to call and leave message upon pt discharge as she is a Education officer, museum and unable to answer her phone during school hours.   CSW spoke with pt insurance, [facility] Medicare who stated that pt is authorized to return to SNF given pt was observation status at hospital only and pt will return under same benefit.  CSW to facilitate pt discharge needs this afternoon.  Alison Murray, MSW, [place] [employer] [phone]

## 2014-01-02 NOTE — Progress Notes (Signed)
I spoke to the Director at [facility] re: reults of positive MRSA PCR screen,verbalized understanding.CHG wipes and bactroban started before patient was d/c.- Sandie Ano RN

## 2014-01-02 NOTE — Progress Notes (Signed)
Report given to the nurse at [facility] spoke with Marjorie Smolder LPN,important info.re patient discussed.She verbalized understanding.- Sandie Ano RN

## 2014-01-02 NOTE — Progress Notes (Addendum)
Pt returning to [facility] [place] today. [employer] authorization is not required since pt is OBS status and surgery was not needed. D/C Summary, AVS and scripts reviewed by nsg and included in packet.  Nsg consulted and found pt to be appropriate for w/c Lucianne Lei transport back to SNF.  [facility] transport arrangements made by [facility] . Results from MRSA by pcr were postive. Nsg spoke with DON at SNF to review results and orders. Updated d/c summary sent to SNF. CSW attempted to contact pt's daughter to inform her of pt d/c back to [facility]. A message was left on VM with CSW cell # . Pt was in agreement with plan for d/c back to SNF.  Werner Lean LCSW [phone]

## 2014-01-02 NOTE — Progress Notes (Signed)
Clinical Social Work Department BRIEF PSYCHOSOCIAL ASSESSMENT 01/02/2014  Patient:  Holly Sloan, Holly Sloan     Account Number:  [id number]     Admit date:  01/01/2014  Clinical Social Worker:  Ulyess Blossom  Date/Time:  01/02/2014 09:30 AM  Referred by:  Physician  Date Referred:  01/02/2014 Referred for  SNF Placement   Other Referral:   Interview type:  Patient Other interview type:    PSYCHOSOCIAL DATA Living Status:  FACILITY Admitted from facility:  [facility] Level of care:  [place] Primary support name:  Fraser Din Saunders/[phone] Primary support relationship to patient:  CHILD, ADULT Degree of support available:   adequate    CURRENT CONCERNS Current Concerns  Post-Acute Placement   Other Concerns:    SOCIAL WORK ASSESSMENT / PLAN CSW received referral that pt admitted from [place] at [facility].    CSW met with pt at bedside. CSW introduced self and explained role. Pt confirmed that she a resident at [facility]. Pt discussed that she has been at facility for seven weeks. Pt states that she plans to return to [facility] upon medical stability from the hospital. CSW provided supportive listening as pt eager to find out if she will have hip replacement in the hospital. Pt agreeable to CSW contacting pt daughter, Tonna Boehringer.    CSW contacted pt daughter, Fraser Din via telephone and left message.    CSW completed FL2 and sent clinicals to [facility] [place]. Pt has [employer] Medicare which requires authorization prior to pt return to SNF and CSW placed sticky note to MD requesting PT/OT evals when appropriate to assist with insurance authorization.    CSW to continue to follow and facilitate pt discharge needs when pt medically ready for discharge and insurance authorization received.   Assessment/plan status:  Psychosocial Support/Ongoing Assessment of Needs Other assessment/ plan:   discharge planning   Information/referral to  community resources:   Referral back to [facility]    PATIENT'S/FAMILY'S RESPONSE TO PLAN OF CARE: Pt alert and oriented x 3. Pt expresses agreement for return to [facility] [place]. CSW left message with pt daughter in order to also discuss with pt daughter plan at discharge. Pt eager to find out if she will have surgery on her hip during this stay in the hospital.    Alison Murray, MSW, [place] [employer] [phone]

## 2014-01-02 NOTE — Progress Notes (Signed)
Patient d/c to SNF via transportation of [facility] [place],. Patient is alert and orientedx3,pleasant. Stable on d/c.-Asti Mackley Rn

## 2014-01-06 ENCOUNTER — Other Ambulatory Visit: Payer: Self-pay | Admitting: Interventional Radiology

## 2014-01-08 ENCOUNTER — Emergency Department (HOSPITAL_COMMUNITY): Payer: Medicare HMO

## 2014-01-08 ENCOUNTER — Inpatient Hospital Stay (HOSPITAL_COMMUNITY)
Admission: EM | Admit: 2014-01-08 | Discharge: 2014-01-20 | DRG: 871 | Disposition: A | Discharge status: Hospice | Payer: Medicare HMO | Attending: Internal Medicine | Admitting: Internal Medicine

## 2014-01-08 ENCOUNTER — Encounter (HOSPITAL_COMMUNITY): Payer: Self-pay | Admitting: Emergency Medicine

## 2014-01-08 DIAGNOSIS — I82409 Acute embolism and thrombosis of unspecified deep veins of unspecified lower extremity: Secondary | ICD-10-CM | POA: Diagnosis present

## 2014-01-08 DIAGNOSIS — K921 Melena: Secondary | ICD-10-CM | POA: Diagnosis present

## 2014-01-08 DIAGNOSIS — E43 Unspecified severe protein-calorie malnutrition: Secondary | ICD-10-CM | POA: Diagnosis present

## 2014-01-08 DIAGNOSIS — E872 Acidosis, unspecified: Secondary | ICD-10-CM | POA: Diagnosis present

## 2014-01-08 DIAGNOSIS — Z86718 Personal history of other venous thrombosis and embolism: Secondary | ICD-10-CM

## 2014-01-08 DIAGNOSIS — I2789 Other specified pulmonary heart diseases: Secondary | ICD-10-CM | POA: Diagnosis present

## 2014-01-08 DIAGNOSIS — N179 Acute kidney failure, unspecified: Secondary | ICD-10-CM | POA: Diagnosis present

## 2014-01-08 DIAGNOSIS — E039 Hypothyroidism, unspecified: Secondary | ICD-10-CM | POA: Diagnosis present

## 2014-01-08 DIAGNOSIS — I4891 Unspecified atrial fibrillation: Secondary | ICD-10-CM | POA: Diagnosis not present

## 2014-01-08 DIAGNOSIS — M87052 Idiopathic aseptic necrosis of left femur: Secondary | ICD-10-CM | POA: Diagnosis present

## 2014-01-08 DIAGNOSIS — I5030 Unspecified diastolic (congestive) heart failure: Secondary | ICD-10-CM | POA: Diagnosis present

## 2014-01-08 DIAGNOSIS — Z515 Encounter for palliative care: Secondary | ICD-10-CM

## 2014-01-08 DIAGNOSIS — D649 Anemia, unspecified: Secondary | ICD-10-CM | POA: Diagnosis present

## 2014-01-08 DIAGNOSIS — Z87440 Personal history of urinary (tract) infections: Secondary | ICD-10-CM

## 2014-01-08 DIAGNOSIS — K625 Hemorrhage of anus and rectum: Secondary | ICD-10-CM

## 2014-01-08 DIAGNOSIS — Z96649 Presence of unspecified artificial hip joint: Secondary | ICD-10-CM

## 2014-01-08 DIAGNOSIS — IMO0002 Reserved for concepts with insufficient information to code with codable children: Secondary | ICD-10-CM | POA: Diagnosis present

## 2014-01-08 DIAGNOSIS — A419 Sepsis, unspecified organism: Principal | ICD-10-CM | POA: Diagnosis present

## 2014-01-08 DIAGNOSIS — E8729 Other acidosis: Secondary | ICD-10-CM | POA: Diagnosis present

## 2014-01-08 DIAGNOSIS — E669 Obesity, unspecified: Secondary | ICD-10-CM | POA: Diagnosis present

## 2014-01-08 DIAGNOSIS — R627 Adult failure to thrive: Secondary | ICD-10-CM | POA: Diagnosis present

## 2014-01-08 DIAGNOSIS — Z87442 Personal history of urinary calculi: Secondary | ICD-10-CM

## 2014-01-08 DIAGNOSIS — E46 Unspecified protein-calorie malnutrition: Secondary | ICD-10-CM | POA: Diagnosis present

## 2014-01-08 DIAGNOSIS — M1610 Unilateral primary osteoarthritis, unspecified hip: Secondary | ICD-10-CM | POA: Diagnosis present

## 2014-01-08 DIAGNOSIS — A048 Other specified bacterial intestinal infections: Secondary | ICD-10-CM | POA: Diagnosis present

## 2014-01-08 DIAGNOSIS — I5032 Chronic diastolic (congestive) heart failure: Secondary | ICD-10-CM | POA: Diagnosis present

## 2014-01-08 DIAGNOSIS — N390 Urinary tract infection, site not specified: Secondary | ICD-10-CM | POA: Diagnosis present

## 2014-01-08 DIAGNOSIS — D62 Acute posthemorrhagic anemia: Secondary | ICD-10-CM

## 2014-01-08 DIAGNOSIS — R1310 Dysphagia, unspecified: Secondary | ICD-10-CM | POA: Diagnosis present

## 2014-01-08 DIAGNOSIS — I10 Essential (primary) hypertension: Secondary | ICD-10-CM | POA: Diagnosis present

## 2014-01-08 DIAGNOSIS — K766 Portal hypertension: Secondary | ICD-10-CM | POA: Diagnosis present

## 2014-01-08 DIAGNOSIS — K649 Unspecified hemorrhoids: Secondary | ICD-10-CM

## 2014-01-08 DIAGNOSIS — Z66 Do not resuscitate: Secondary | ICD-10-CM | POA: Diagnosis present

## 2014-01-08 DIAGNOSIS — E8809 Other disorders of plasma-protein metabolism, not elsewhere classified: Secondary | ICD-10-CM

## 2014-01-08 DIAGNOSIS — K922 Gastrointestinal hemorrhage, unspecified: Secondary | ICD-10-CM | POA: Diagnosis present

## 2014-01-08 DIAGNOSIS — I509 Heart failure, unspecified: Secondary | ICD-10-CM | POA: Diagnosis present

## 2014-01-08 DIAGNOSIS — M169 Osteoarthritis of hip, unspecified: Secondary | ICD-10-CM | POA: Diagnosis present

## 2014-01-08 DIAGNOSIS — K644 Residual hemorrhoidal skin tags: Secondary | ICD-10-CM | POA: Diagnosis present

## 2014-01-08 DIAGNOSIS — M87059 Idiopathic aseptic necrosis of unspecified femur: Secondary | ICD-10-CM | POA: Diagnosis present

## 2014-01-08 LAB — LIPASE, BLOOD: Lipase: 8 U/L — ABNORMAL LOW (ref 11–59)

## 2014-01-08 LAB — CBC WITH DIFFERENTIAL/PLATELET
Basophils Absolute: 0.1 10*3/uL (ref 0.0–0.1)
Basophils Relative: 1 % (ref 0–1)
EOS PCT: 2 % (ref 0–5)
Eosinophils Absolute: 0.2 10*3/uL (ref 0.0–0.7)
HCT: 40.1 % (ref 36.0–46.0)
Hemoglobin: 13 g/dL (ref 12.0–15.0)
Lymphocytes Relative: 36 % (ref 12–46)
Lymphs Abs: 3.5 10*3/uL (ref 0.7–4.0)
MCH: 29 pg (ref 26.0–34.0)
MCHC: 32.4 g/dL (ref 30.0–36.0)
MCV: 89.5 fL (ref 78.0–100.0)
MONOS PCT: 14 % — AB (ref 3–12)
Monocytes Absolute: 1.4 10*3/uL — ABNORMAL HIGH (ref 0.1–1.0)
NEUTROS PCT: 47 % (ref 43–77)
Neutro Abs: 4.5 10*3/uL (ref 1.7–7.7)
PLATELETS: 392 10*3/uL (ref 150–400)
RBC: 4.48 MIL/uL (ref 3.87–5.11)
RDW: 17.2 % — ABNORMAL HIGH (ref 11.5–15.5)
WBC: 9.7 10*3/uL (ref 4.0–10.5)

## 2014-01-08 LAB — URINALYSIS, ROUTINE W REFLEX MICROSCOPIC
BILIRUBIN URINE: NEGATIVE
Glucose, UA: NEGATIVE mg/dL
Ketones, ur: NEGATIVE mg/dL
Nitrite: POSITIVE — AB
PROTEIN: NEGATIVE mg/dL
SPECIFIC GRAVITY, URINE: 1.01 (ref 1.005–1.030)
UROBILINOGEN UA: 0.2 mg/dL (ref 0.0–1.0)
pH: 5.5 (ref 5.0–8.0)

## 2014-01-08 LAB — COMPREHENSIVE METABOLIC PANEL
ALT: 8 U/L (ref 0–35)
AST: 12 U/L (ref 0–37)
Albumin: 1.2 g/dL — ABNORMAL LOW (ref 3.5–5.2)
Alkaline Phosphatase: 117 U/L (ref 39–117)
BILIRUBIN TOTAL: 0.2 mg/dL — AB (ref 0.3–1.2)
BUN: 76 mg/dL — ABNORMAL HIGH (ref 6–23)
CHLORIDE: 101 meq/L (ref 96–112)
CO2: 19 meq/L (ref 19–32)
CREATININE: 1.48 mg/dL — AB (ref 0.50–1.10)
Calcium: 8.7 mg/dL (ref 8.4–10.5)
GFR calc Af Amer: 37 mL/min — ABNORMAL LOW (ref >90)
GFR, EST NON AFRICAN AMERICAN: 32 mL/min — AB (ref >90)
Glucose, Bld: 106 mg/dL — ABNORMAL HIGH (ref 70–99)
Potassium: 5.3 mEq/L (ref 3.7–5.3)
Sodium: 135 mEq/L — ABNORMAL LOW (ref 137–147)
Total Protein: 3.5 g/dL — ABNORMAL LOW (ref 6.0–8.3)

## 2014-01-08 LAB — URINE MICROSCOPIC-ADD ON

## 2014-01-08 LAB — TROPONIN I: Troponin I: <0.3 ng/mL (ref <0.30)

## 2014-01-08 LAB — LACTIC ACID, PLASMA: Lactic Acid, Venous: 3.3 mmol/L — ABNORMAL HIGH (ref 0.5–2.2)

## 2014-01-08 MED ORDER — SODIUM CHLORIDE 0.9 % IV SOLN
INTRAVENOUS | Status: DC
Start: 2014-01-08 — End: 2014-01-09

## 2014-01-08 MED ORDER — DEXTROSE 5 % IV SOLN
2.0000 g | INTRAVENOUS | Status: DC
  Administered 2014-01-08 – 2014-01-12 (×5): 2 g via INTRAVENOUS
  Filled 2014-01-08 (×5): qty 2

## 2014-01-08 MED ORDER — SODIUM CHLORIDE 0.9 % IV SOLN
INTRAVENOUS | Status: DC
  Administered 2014-01-08 – 2014-01-09 (×3): via INTRAVENOUS

## 2014-01-08 MED ORDER — DEXTROSE 5 % IV SOLN
1.0000 g | Freq: Once | INTRAVENOUS | Status: AC
  Administered 2014-01-08: 1 g via INTRAVENOUS
  Filled 2014-01-08: qty 10

## 2014-01-08 MED ORDER — SODIUM CHLORIDE 0.9 % IJ SOLN
3.0000 mL | Freq: Two times a day (BID) | INTRAMUSCULAR | Status: DC
  Administered 2014-01-08 – 2014-01-20 (×25): 3 mL via INTRAVENOUS

## 2014-01-08 MED ORDER — SODIUM CHLORIDE 0.9 % IV BOLUS (SEPSIS)
500.0000 mL | Freq: Once | INTRAVENOUS | Status: AC
  Administered 2014-01-08: 500 mL via INTRAVENOUS

## 2014-01-08 MED ORDER — ONDANSETRON HCL 4 MG PO TABS: 4.0000 mg | ORAL_TABLET | Freq: Four times a day (QID) | ORAL | Status: DC | PRN

## 2014-01-08 MED ORDER — VITAMIN C 500 MG PO TABS
500.0000 mg | ORAL_TABLET | Freq: Two times a day (BID) | ORAL | Status: DC
  Administered 2014-01-09 – 2014-01-19 (×21): 500 mg via ORAL
  Filled 2014-01-08 (×22): qty 1

## 2014-01-08 MED ORDER — LEVOTHYROXINE SODIUM 25 MCG PO TABS
25.0000 ug | ORAL_TABLET | Freq: Every day | ORAL | Status: DC
  Administered 2014-01-09 – 2014-01-20 (×11): 25 ug via ORAL
  Filled 2014-01-08 (×11): qty 1

## 2014-01-08 MED ORDER — ACETAMINOPHEN 325 MG PO TABS
650.0000 mg | ORAL_TABLET | Freq: Four times a day (QID) | ORAL | Status: DC | PRN
  Administered 2014-01-10 – 2014-01-14 (×4): 650 mg via ORAL
  Filled 2014-01-08 (×4): qty 2

## 2014-01-08 MED ORDER — VITAMIN D 1000 UNITS PO TABS
1000.0000 [IU] | ORAL_TABLET | Freq: Every day | ORAL | Status: DC
  Administered 2014-01-09 – 2014-01-20 (×12): 1000 [IU] via ORAL
  Filled 2014-01-08 (×13): qty 1

## 2014-01-08 MED ORDER — IOHEXOL 300 MG/ML  SOLN
50.0000 mL | Freq: Once | INTRAMUSCULAR | Status: AC | PRN
  Administered 2014-01-08: 50 mL via ORAL

## 2014-01-08 MED ORDER — PRO-STAT SUGAR FREE PO LIQD
30.0000 mL | Freq: Four times a day (QID) | ORAL | Status: DC
  Administered 2014-01-09 – 2014-01-20 (×44): 30 mL via ORAL
  Filled 2014-01-08 (×44): qty 30

## 2014-01-08 MED ORDER — VITAMIN B-12 1000 MCG PO TABS
500.0000 ug | ORAL_TABLET | Freq: Every day | ORAL | Status: DC
  Administered 2014-01-09 – 2014-01-19 (×11): 500 ug via ORAL
  Filled 2014-01-08 (×11): qty 1

## 2014-01-08 MED ORDER — FERROUS SULFATE 325 (65 FE) MG PO TABS
325.0000 mg | ORAL_TABLET | Freq: Two times a day (BID) | ORAL | Status: DC
  Administered 2014-01-09 – 2014-01-16 (×14): 325 mg via ORAL
  Filled 2014-01-08 (×14): qty 1

## 2014-01-08 MED ORDER — GUAIFENESIN-DM 100-10 MG/5ML PO SYRP: 5.0000 mL | ORAL_SOLUTION | ORAL | Status: DC | PRN

## 2014-01-08 MED ORDER — RIVAROXABAN 20 MG PO TABS
20.0000 mg | ORAL_TABLET | Freq: Every day | ORAL | Status: DC
  Administered 2014-01-09 – 2014-01-14 (×6): 20 mg via ORAL
  Filled 2014-01-08 (×6): qty 1

## 2014-01-08 MED ORDER — ONDANSETRON HCL 4 MG/2ML IJ SOLN
4.0000 mg | Freq: Four times a day (QID) | INTRAMUSCULAR | Status: DC | PRN
  Administered 2014-01-13: 4 mg via INTRAVENOUS
  Filled 2014-01-08: qty 2

## 2014-01-08 MED ORDER — ACETAMINOPHEN 650 MG RE SUPP: 650.0000 mg | Freq: Four times a day (QID) | RECTAL | Status: DC | PRN

## 2014-01-08 MED ORDER — ALBUTEROL SULFATE (2.5 MG/3ML) 0.083% IN NEBU
2.5000 mg | INHALATION_SOLUTION | RESPIRATORY_TRACT | Status: DC | PRN
  Administered 2014-01-17: 2.5 mg via RESPIRATORY_TRACT
  Filled 2014-01-08: qty 3

## 2014-01-08 MED ORDER — RISAQUAD PO CAPS
1.0000 | ORAL_CAPSULE | Freq: Every day | ORAL | Status: DC
  Administered 2014-01-09 – 2014-01-20 (×11): 1 via ORAL
  Filled 2014-01-08 (×13): qty 1

## 2014-01-08 MED ORDER — PANTOPRAZOLE SODIUM 40 MG IV SOLR
40.0000 mg | Freq: Two times a day (BID) | INTRAVENOUS | Status: DC
  Administered 2014-01-09 – 2014-01-10 (×5): 40 mg via INTRAVENOUS
  Filled 2014-01-08 (×5): qty 40

## 2014-01-08 MED ORDER — IOHEXOL 300 MG/ML  SOLN
100.0000 mL | Freq: Once | INTRAMUSCULAR | Status: AC | PRN
  Administered 2014-01-08: 80 mL via INTRAVENOUS

## 2014-01-08 NOTE — ED Notes (Signed)
Pt and pt's family refused for pt to be stuck anymore for [facility], EDP Cook and Dr. Sloan Leiter are aware of pt's refusal for anymore sticks for blood

## 2014-01-08 NOTE — ED Notes (Signed)
Sent from [facility] due to abnormal labs, pt denies any pain

## 2014-01-08 NOTE — ED Provider Notes (Addendum)
CSN: [id number]     Arrival date & time 01/08/14  1352 History  This chart was scribed for Nat Christen, MD by Zettie Pho, ED Scribe. This patient was seen in room APA12/APA12 and the patient's care was started at 2:53 PM.    Chief Complaint  Patient presents with  . Abnormal Lab   The history is provided by the patient and a relative (daughter). No language interpreter was used.   Level 5 Caveat- Failure to thrive  HPI Comments: Holly Sloan is a 80 y.o. female who presents to the Emergency Department from [place] facility complaining of pain to the lower abdomen with multiple episodes of diarrhea and vomiting onset last night. Her daughter reports that the patient has been more fatigued and confused for the past week, which was has been progressively worsening. She reports that the patient was evaluated at [facility] one week ago and had a filter placed for a DVT prior to having a left hip fracture surgically repaired. Patient has a surgical history of appendectomy, esophagogastroduodenoscopy, and colonoscopy. Patient has a history of CHF, HTN, DVT.   Past Medical History  Diagnosis Date  . CHF (congestive heart failure)   . Hypertension   . Arthritis   . Kidney stone   . Pneumonia     20 years ago  . DVT (deep venous thrombosis) 10/14/13    left leg  . Fracture of wrist 10/18/13    right  . Hip fracture, left   . Deep vein blood clot of left lower extremity   . Hemorrhoids     large per colonoscopy 3/15  . Hiatal hernia     per endo 3/15  . Erosive gastritis     mild per endo 3/15   Past Surgical History  Procedure Laterality Date  . Appendectomy      as teenager  . Kidney stone surgery  jan 2012    stone remopved left side  . Hip pinning,cannulated  05/23/2011    Procedure: CANNULATED HIP PINNING;  Surgeon: Arther Abbott, MD;  Location: [facility] ORS;  Service: Orthopedics;  Laterality: Left;  late entry: updated foley documentation  . Hip open reduction Left 2012  .  Esophagogastroduodenoscopy  April 2003    Dr. Gala Romney: 2 polyps proximal stomach, moderate sized hiatal hernia  . Colonoscopy  April 2003    Dr. Gala Romney: left-sided transverse diverticula  . Colonoscopy with esophagogastroduodenoscopy (egd) N/A 10/28/2013    Procedure: COLONOSCOPY WITH ESOPHAGOGASTRODUODENOSCOPY (EGD);  Surgeon: Danie Binder, MD;  Location: [facility] ENDO SUITE;  Service: Endoscopy;  Laterality: N/A;   Family History  Problem Relation Age of Onset  . Anesthesia problems Neg Hx   . Hypotension Neg Hx   . Malignant hyperthermia Neg Hx   . Pseudochol deficiency Neg Hx   . Colon cancer Neg Hx    History  Substance Use Topics  . Smoking status: Never Smoker   . Smokeless tobacco: Never Used  . Alcohol Use: No   OB History   Grav Para Term Preterm Abortions TAB SAB Ect Mult Living                 Review of Systems  Unable to perform ROS: Other   Allergies  Review of patient's allergies indicates no known allergies.  Home Medications    Triage Vitals: BP 77/45  Pulse 92  Temp(Src) 97.5 F (36.4 C) (Oral)  Resp 18  Ht 5\' 6"  (1.676 m)  Wt 165 lb (74.844  kg)  BMI 26.64 kg/m2  SpO2 95%  Physical Exam  Nursing note and vitals reviewed. Constitutional: She is oriented to person, place, and time. She appears well-developed and well-nourished.  HENT:  Head: Normocephalic and atraumatic.  Eyes: Conjunctivae and EOM are normal. Pupils are equal, round, and reactive to light.  Neck: Normal range of motion. Neck supple.  Cardiovascular: Regular rhythm and normal heart sounds.   Hypotensive.   Pulmonary/Chest: Effort normal and breath sounds normal.  Abdominal: Soft. Bowel sounds are normal. There is tenderness.  Tenderness to lower abdomen.   Musculoskeletal: Normal range of motion.  Able to move all extremities.   Neurological: She is alert and oriented to person, place, and time.  Patient is able to follow simple commands. Patient is alert and oriented to person,  place, and time.   Skin: Skin is warm and dry.  Psychiatric: She has a normal mood and affect. Her behavior is normal.    ED Course  Procedures (including critical care time)  DIAGNOSTIC STUDIES: Oxygen Saturation is 95% on room air, normal by my interpretation.    COORDINATION OF CARE: 2:59 PM- Discussed that patient will likely need to be admitted to the hospital. Will order an abdominal CT, chest x-ray, troponin, CMP, CBC, lipase, and UA. Will order IV fluids to manage symptoms. Discussed treatment plan with patient at bedside and patient verbalized agreement.    EKG Interpretation   Date/Time:  Thursday Jan 08 2014 15:25:55 EDT Ventricular Rate:  88 PR Interval:  176 QRS Duration: 97 QT Interval:  374 QTC Calculation: 452 R Axis:   -59 Text Interpretation:  Uncertain rhythm: review Anterior infarct, old  Confirmed by Gwin Eagon  MD, Hermenegildo Clausen ([id number]) on 01/08/2014 5:35:21 PM     CRITICAL CARE Performed by: Nat Christen  ?  Total critical care time: 30  Critical care time was exclusive of separately billable procedures and treating other patients.  Critical care was necessary to treat or prevent imminent or life-threatening deterioration.  Critical care was time spent personally by me on the following activities: development of treatment plan with patient and/or surrogate as well as nursing, discussions with consultants, evaluation of patient's response to treatment, examination of patient, obtaining history from patient or surrogate, ordering and performing treatments and interventions, ordering and review of laboratory studies, ordering and review of radiographic studies, pulse oximetry and re-evaluation of patient's condition. MDM   Final diagnoses:  Failure to thrive    Family reports patient has been doing poorly for several weeks.  She is status post IVC filter placement on 01/01/2013. Urinalysis shows 21-50 white blood cells. Rx Rocephin 1 g IV. CT abdomen and pelvis  reveals third of fluid was subcutaneous and mesenteric edema with bilateral pleural effusions and a small amount of ascites. Will hydrate as vigorously as her system can handle it.  Admit to general medicine  I personally performed the services described in this documentation, which was scribed in my presence. The recorded information has been reviewed and is accurate.     Nat Christen, MD 01/08/14 Max Meadows, MD 01/09/14 [phone]

## 2014-01-08 NOTE — ED Notes (Signed)
Pt with Velcro splint to left forearm and states pt is to have surgery on left hip soon, unable to ambulate

## 2014-01-08 NOTE — Progress Notes (Signed)
ANTIBIOTIC CONSULT NOTE - INITIAL  Pharmacy Consult for Cefepime Indication: Sepsis  No Known Allergies  Patient Measurements: Height: 5\' 6"  (167.6 cm) Weight: 165 lb (74.844 kg) IBW/kg (Calculated) : 59.3 Adjusted Body Weight:   Vital Signs: Temp: 97.5 F (36.4 C) (05/28 1354) Temp src: Oral (05/28 1354) BP: 82/59 mmHg (05/28 2030) Pulse Rate: 91 (05/28 2000) Intake/Output from previous day:   Intake/Output from this shift:    Labs:  Recent Labs  01/08/14 1617  WBC 9.7  HGB 13.0  PLT 392  CREATININE 1.48*   Estimated Creatinine Clearance: 30.3 ml/min (by C-G formula based on Cr of 1.48). No results found for this basename: VANCOTROUGH, Corlis Leak, VANCORANDOM, [place], GENTPEAK, GENTRANDOM, TOBRATROUGH, TOBRAPEAK, TOBRARND, AMIKACINPEAK, AMIKACINTROU, AMIKACIN,  in the last 72 hours   Microbiology: Recent Results (from the past 720 hour(s))  MRSA PCR SCREENING     Status: Abnormal   Collection Time    01/02/14 11:08 AM      Result Value Ref Range Status   MRSA by PCR POSITIVE (*) NEGATIVE Final   Comment:            The GeneXpert MRSA Assay (FDA     approved for NASAL specimens     only), is one component of a     comprehensive MRSA colonization     surveillance program. It is not     intended to diagnose MRSA     infection nor to guide or     monitor treatment for     MRSA infections.     RESULT CALLED TO, READ BACK BY AND VERIFIED WITH:     TRACEY,J.RN 1446 ON 01/02/14 BY MCCOY,N.     RESULT CALLED TO, READ BACK BY AND VERIFIED WITH:     TRACEY,J. RN @ 5284 ON 01/02/14 BY MCCOY,N.     RESULT CALLED TO, READ BACK BY AND VERIFIED WITH:     TRACEY,J. RN @ 1324 ON 01/02/14 BY MCCOY,N.     Medical History: Past Medical History  Diagnosis Date  . CHF (congestive heart failure)   . Hypertension   . Arthritis   . Kidney stone   . Pneumonia     20 years ago  . DVT (deep venous thrombosis) 10/14/13    left leg  . Fracture of wrist 10/18/13    right  .  Hip fracture, left   . Deep vein blood clot of left lower extremity   . Hemorrhoids     large per colonoscopy 3/15  . Hiatal hernia     per endo 3/15  . Erosive gastritis     mild per endo 3/15    Medications:  Scheduled:   Assessment: Cefepime for sepsis, UTI Reduced renal function  Goal of Therapy:  Eradicate infection  Plan:  Cefepime 2 GM IV every 24 hours Monitor renal function F/U cultures Labs per protocol  Yomaira Solar Lenoard Aden 01/08/2014,9:19 PM

## 2014-01-08 NOTE — H&P (Addendum)
PATIENT DETAILS Name: Holly Sloan Age: 80 y.o. Sex: female Date of Birth: 1928/08/27 Admit Date: 01/08/2014 PCP:KIM, Jeneen Rinks, MD   CHIEF COMPLAINT:  Nausea with vomiting and intermittent diarrhea for the past 5 or 6 days. Intermittent lower abdominal pain for the past few days  HPI: Holly Sloan is a 80 y.o. female with a Past Medical History of chronic diastolic heart failure, history of DVT x2-status post recent IVC filter placement, history of left hip fracture in 0973-[id number] complicated by avascular necrosis and is thought to require a total hip replacement which is scheduled for sometime later this year who presents today with the above noted complaint. Apparently ever since discharge from [facility] last week after she had a IVC filter placed, patient has had numerous episodes of nausea along with vomiting and diarrhea. Her diarrhea seems to have resolved today. She currently lives in a skilled nursing facility, and was noted to have extremely elevated blood pressure with a systolic in the 992E range along with the above-noted symptoms, she was then transferred to [facility] for further evaluation. In the ED, patient was noted to have acute renal failure, very low albumin levels, along with UTI. Her blood pressure slowly has started to drop, during my evaluation it was in the 80 systolic range, patient however appeared asymptomatic.  Per history obtained from the patient and daughter, patient has had steady decline over the past few months, she is now essentially nonambulatory because of left hip pain, she's had decrease in appetite going on for the past few months. Patient and her family realize that patient is clearly failing to thrive, as well as the overall poor prognosis.   ALLERGIES:  No Known Allergies  PAST MEDICAL HISTORY: Past Medical History  Diagnosis Date  . CHF (congestive heart failure)   . Hypertension   . Arthritis   . Kidney stone     . Pneumonia     20 years ago  . DVT (deep venous thrombosis) 10/14/13    left leg  . Fracture of wrist 10/18/13    right  . Hip fracture, left   . Deep vein blood clot of left lower extremity   . Hemorrhoids     large per colonoscopy 3/15  . Hiatal hernia     per endo 3/15  . Erosive gastritis     mild per endo 3/15    PAST SURGICAL HISTORY: Past Surgical History  Procedure Laterality Date  . Appendectomy      as teenager  . Kidney stone surgery  jan 2012    stone remopved left side  . Hip pinning,cannulated  05/23/2011    Procedure: CANNULATED HIP PINNING;  Surgeon: Arther Abbott, MD;  Location: [facility] ORS;  Service: Orthopedics;  Laterality: Left;  late entry: updated foley documentation  . Hip open reduction Left 2012  . Esophagogastroduodenoscopy  April 2003    Dr. Gala Romney: 2 polyps proximal stomach, moderate sized hiatal hernia  . Colonoscopy  April 2003    Dr. Gala Romney: left-sided transverse diverticula  . Colonoscopy with esophagogastroduodenoscopy (egd) N/A 10/28/2013    Procedure: COLONOSCOPY WITH ESOPHAGOGASTRODUODENOSCOPY (EGD);  Surgeon: Danie Binder, MD;  Location: [facility];  Service: Endoscopy;  Laterality: N/A;    MEDICATIONS AT HOME: Prior to Admission medications   Medication Sig Start Date End Date Taking? Authorizing Provider  acidophilus (RISAQUAD) CAPS capsule Take 1 capsule by mouth daily.   Yes Historical Provider, MD  Amino Acids-Protein Hydrolys (FEEDING SUPPLEMENT, PRO-STAT SUGAR FREE 64,) LIQD Take 30 mLs by mouth 4 (four) times daily.   Yes Historical Provider, MD  ascorbic acid (VITAMIN C) 500 MG tablet Take 500 mg by mouth 2 (two) times daily.   Yes Historical Provider, MD  cholecalciferol (VITAMIN D) 1000 UNITS tablet Take 1,000 Units by mouth daily.   Yes Historical Provider, MD  cyanocobalamin 500 MCG tablet Take 500 mcg by mouth daily.   Yes Historical Provider, MD  ferrous sulfate 325 (65 FE) MG tablet Take 325 mg by mouth 2 (two) times daily  with a meal.   Yes Historical Provider, MD  furosemide (LASIX) 40 MG tablet Take 40-80 mg by mouth 2 (two) times daily. 80mg  in the am and 40mg  in the evening take 40mg  twice daily for edema to lower extremities   Yes Historical Provider, MD  gabapentin (NEURONTIN) 300 MG capsule Take 300 mg by mouth 3 (three) times daily.   Yes Historical Provider, MD  levothyroxine (SYNTHROID, LEVOTHROID) 25 MCG tablet Take 25 mcg by mouth every morning.  10/13/13  Yes Historical Provider, MD  mupirocin ointment (BACTROBAN) 2 % Place 1 application into the nose 2 (two) times daily. Starting 01/03/2014-01/08/2014   Yes Historical Provider, MD  pantoprazole (PROTONIX) 40 MG tablet Take 1 tablet (40 mg total) by mouth 2 (two) times daily before a meal. 10/30/13  Yes Lezlie Octave Black, NP  potassium chloride SA (K-DUR,KLOR-CON) 20 MEQ tablet Take 20 mEq by mouth daily.   Yes Historical Provider, MD  rivaroxaban (XARELTO) 20 MG TABS tablet Take 20 mg by mouth daily with supper.   Yes Historical Provider, MD  spironolactone (ALDACTONE) 25 MG tablet Take 25 mg by mouth daily.   Yes Historical Provider, MD  traMADol (ULTRAM) 50 MG tablet Take 50 mg by mouth every 6 (six) hours as needed for moderate pain.   Yes Historical Provider, MD  amLODipine (NORVASC) 2.5 MG tablet Take 2.5 mg by mouth daily.    Historical Provider, MD  Chlorhexidine Gluconate 2 % PADS Apply topically See admin instructions. Use 6 pads daily for 5 days for MRSA. Avoid eyes, mouth and genital area    Historical Provider, MD  sennosides-docusate sodium (SENOKOT-S) 8.6-50 MG tablet Take 1 tablet by mouth 2 (two) times daily as needed for constipation.     Historical Provider, MD    FAMILY HISTORY: Family History  Problem Relation Age of Onset  . Anesthesia problems Neg Hx   . Hypotension Neg Hx   . Malignant hyperthermia Neg Hx   . Pseudochol deficiency Neg Hx   . Colon cancer Neg Hx     SOCIAL HISTORY:  reports that she has never smoked. She has never  used smokeless tobacco. She reports that she does not drink alcohol or use illicit drugs.  REVIEW OF SYSTEMS:  Constitutional:   No  night sweats, chills, fatigue.  HEENT:    No headaches, Difficulty swallowing,Tooth/dental problems,Sore throat,  No sneezing, itching, ear ache, nasal congestion, post nasal drip  Cardio-vascular: No chest pain,  Orthopnea, PND, swelling in lower extremities, anasarca,  dizziness, palpitations  GI:  No heartburn, indigestion,  change in  bowel habits, loss of appetite  Resp: No shortness of breath with exertion or at rest.  No excess mucus, no productive cough, No non-productive cough,  No coughing up of blood.No change in color of mucus.No wheezing.No chest wall deformity  Skin:  no rash or lesions.  GU:  no dysuria, change  in color of urine, no urgency or frequency.  No flank pain.  Musculoskeletal: No joint pain or swelling.  No decreased range of motion.  No back pain.  Psych: No change in mood or affect.    PHYSICAL EXAM: Blood pressure 74/58, pulse 88, temperature 97.5 F (36.4 C), temperature source Oral, resp. rate 13, height 5\' 6"  (1.676 m), weight 74.844 kg (165 lb), SpO2 97.00%.  General appearance :Awake, alert, not in any distress. Speech Clear.Chronically sick looking. HEENT: Atraumatic and Normocephalic, pupils equally reactive to light and accomodation Neck: supple, no JVD. No cervical lymphadenopathy.  Chest:Good air entry bilaterally, no added sounds  CVS: S1 S2 regular, no murmurs.  Abdomen: Bowel sounds present, Non tender and not distended with no gaurding, rigidity or rebound. Extremities: B/L Lower Ext shows  2 +edema, both legs are warm to touch Neurology: Awake alert, and oriented X 3, CN II-XII intact, Non focal-but with generalized weakness Skin:No Rash Wounds:N/A  LABS ON ADMISSION:   Recent Labs  01/08/14 1617  NA 135*  K 5.3  CL 101  CO2 19  GLUCOSE 106*  BUN 76*  CREATININE 1.48*  CALCIUM 8.7     Recent Labs  01/08/14 1617  AST 12  ALT 8  ALKPHOS 117  BILITOT 0.2*  PROT 3.5*  ALBUMIN 1.2*    Recent Labs  01/08/14 1617  LIPASE 8*    Recent Labs  01/08/14 1617  WBC 9.7  NEUTROABS 4.5  HGB 13.0  HCT 40.1  MCV 89.5  PLT 392    Recent Labs  01/08/14 1617  TROPONINI <0.30   No results found for this basename: DDIMER,  in the last 72 hours No components found with this basename: POCBNP,    RADIOLOGIC STUDIES ON ADMISSION: Ct Abdomen Pelvis W Contrast  01/08/2014   CLINICAL DATA:  Abdominal pain.  Vomiting and diarrhea.  EXAM: CT ABDOMEN AND PELVIS WITH CONTRAST  TECHNIQUE: Multidetector CT imaging of the abdomen and pelvis was performed using the standard protocol following bolus administration of intravenous contrast.  CONTRAST:  54mL OMNIPAQUE IOHEXOL 300 MG/ML SOLN, 54mL OMNIPAQUE IOHEXOL 300 MG/ML SOLN  COMPARISON:  Multiple exams, including 02/20/2011 and 07/19/2010  FINDINGS: Both moderate bilateral pleural effusions with passive atelectasis. Elevated right hemidiaphragm. The liver, spleen, with adrenal glands, and pancreas appear unremarkable.  An infrarenal IVC filter is present. There has been further if flattening and severe a atrophy of the left kidney. There continues to be a partially calcified 2.6 x 1.8 cm left kidney upper pole mass, slightly reduced in size compared to the prior exam.  Diffuse subcutaneous and mesenteric edema. Portal vein and splenic vein appear patent.  Right kidney unremarkable. There is several mildly dilated scattered loops of small bowel, without a well-defined point of obstruction. Appendix not well seen. There is prominent sigmoid diverticulosis with scattered diverticula of the descending colon. Orally administered contrast makes its way through to the colon.  There appear to be numerous large diverticula in the small bowel, measuring up to 6 cm in diameter, with air-fluid levels. No extraluminal gas or extraluminal contrast.   Considerable omental portosystemic varices. Small amount of pelvic ascites. Presacral edema along with extensive subcutaneous edema in the lower pelvis.  Prior pinning of the left hip noted. There is severe osteoarthritis along with articular surface irregularity along the left femoral head, along with a left hip effusion.  IMPRESSION: 1. Considerable third spacing of fluid with subcutaneous and mesenteric edema in addition to moderate bilateral pleural  effusions and a small amount of ascites. 2. Considerable omental portosystemic varices/shunting indicating portal venous hypertension. 3. Numerous scattered diverticula of the small bowel, measuring up to 6 cm in diameter. I do not identify any of these is being inflamed. There is also descending and sigmoid colon diverticulosis. 4. Mildly elevated right hemidiaphragm. 5. Reduced size of the calcified left kidney upper pole mass, arguing against malignancy. The left kidney is severely atrophic and flattened. 6. Pinning of the left hip, with left hip superior cortical irregularity and severe left hip osteoarthritis. There is been some collapse of articular bone and and volume in the left femoral head, leading to joint exposure of one of the pin tips.   Electronically Signed   By: Sherryl Barters M.D.   On: 01/08/2014 17:54   Dg Chest Portable 1 View  01/08/2014   CLINICAL DATA:  Abdominal pain, nausea, vomiting, diarrhea, fatigue.  EXAM: PORTABLE CHEST - 1 VIEW  COMPARISON:  10/26/2013  FINDINGS: Lung volumes are low with chronic elevation of the right hemidiaphragm. There is no evidence of pulmonary edema, consolidation, pneumothorax, nodule or pleural fluid. The heart size and mediastinal contours are within normal limits. Visualized bony thorax is unremarkable.  IMPRESSION: Low lung volumes with elevation of the right hemidiaphragm. No acute findings.   Electronically Signed   By: Aletta Edouard M.D.   On: 01/08/2014 15:20     EKG: Independently reviewed.  Low voltage-?sinus.  ASSESSMENT AND PLAN: Present on Admission:  . Sepsis -Hypotensive, with UTI-admit to ICU, IVF resuscitation, IV Cefepime -check Lactate, obtain Urine and Blood cultures.  -if persistently hypotensive, may need to start pressors-for now will try IVF resuscitation and hold all antihypertensives and diuretics  . UTI (lower urinary tract infection) - Start cefepime- her family she has had numerous recent UTIs and has had numerous antibiotics in the past. Follow urine culture and blood culture results.  . History of DVT - Continues Xarelot,, hopefully with IV fluids renal failure will improve, otherwise does of zoster may need to be adjusted.  . Acute renal failure - Likely secondary to vomiting and diarrhea, along with use of diuretics. - Hydrate, recheck electrolytes in a.m.  . Avascular necrosis of bone of left hip - Supportive care, she has followup with Dr. Alvan Dame to schedule hip replacement, her hematocrit operative candidate-albumin level I.4.  . Failure to thrive/severe malnutrition  - Overall poor prognosis, patient has had significant decline in health over the past few months.  - Nutritional eval, PT eval.  - Continue with supplements.   . Diastolic CHF - Patient's peripheral edema, and third spacing probably secondary to severe hypoalbuminemia. Clinically intravascularly depleted, however third spacing - will try and resuscitate with IV fluids given hypotension. Check echocardiogram. She also appears to low voltage EKG, repeat EKG in a.m.  . HTN (hypertension) - Holding all antihypertensives and diuretics given hypotension. Resume when able.   . Hypothyroidism - Continue with levothyroxine.  Further plan will depend as patient's clinical course evolves and further radiologic and laboratory data become available. Patient will be monitored closely.  Above noted plan was discussed with patient/daughters, they were in agreement.   DVT Prophylaxis: On  Xarelto  Code Status: Full Code-however family aware of very poor overall prognosis  Total time spent for admission equals 45 minutes.  Jonetta Osgood [facility] Pager [phone]  If 7PM-7AM, please contact night-coverage [email or web address] Password [facility] 01/08/2014, 7:27 PM  **Disclaimer: This note may have been dictated with voice recognition software.  Similar sounding words can inadvertently be transcribed and this note may contain transcription errors which may not have been corrected upon publication of note.**

## 2014-01-09 ENCOUNTER — Encounter (HOSPITAL_COMMUNITY): Payer: Commercial Managed Care - HMO

## 2014-01-09 DIAGNOSIS — E8729 Other acidosis: Secondary | ICD-10-CM | POA: Diagnosis present

## 2014-01-09 DIAGNOSIS — I519 Heart disease, unspecified: Secondary | ICD-10-CM

## 2014-01-09 DIAGNOSIS — E872 Acidosis, unspecified: Secondary | ICD-10-CM

## 2014-01-09 DIAGNOSIS — N179 Acute kidney failure, unspecified: Secondary | ICD-10-CM | POA: Diagnosis present

## 2014-01-09 LAB — BASIC METABOLIC PANEL
BUN: 69 mg/dL — ABNORMAL HIGH (ref 6–23)
CHLORIDE: 104 meq/L (ref 96–112)
CO2: 16 mEq/L — ABNORMAL LOW (ref 19–32)
CREATININE: 1.26 mg/dL — AB (ref 0.50–1.10)
Calcium: 8.4 mg/dL (ref 8.4–10.5)
GFR calc Af Amer: 45 mL/min — ABNORMAL LOW (ref >90)
GFR, EST NON AFRICAN AMERICAN: 39 mL/min — AB (ref >90)
GLUCOSE: 83 mg/dL (ref 70–99)
POTASSIUM: 4.4 meq/L (ref 3.7–5.3)
Sodium: 136 mEq/L — ABNORMAL LOW (ref 137–147)

## 2014-01-09 LAB — CBC
HEMATOCRIT: 40.3 % (ref 36.0–46.0)
Hemoglobin: 12.9 g/dL (ref 12.0–15.0)
MCH: 29 pg (ref 26.0–34.0)
MCHC: 32 g/dL (ref 30.0–36.0)
MCV: 90.6 fL (ref 78.0–100.0)
Platelets: 386 10*3/uL (ref 150–400)
RBC: 4.45 MIL/uL (ref 3.87–5.11)
RDW: 17.3 % — ABNORMAL HIGH (ref 11.5–15.5)
WBC: 9.2 10*3/uL (ref 4.0–10.5)

## 2014-01-09 MED ORDER — VANCOMYCIN HCL 10 G IV SOLR
1250.0000 mg | INTRAVENOUS | Status: DC
  Administered 2014-01-09 – 2014-01-10 (×2): 1250 mg via INTRAVENOUS
  Filled 2014-01-09 (×4): qty 1250

## 2014-01-09 MED ORDER — SODIUM CHLORIDE 0.9 % IV BOLUS (SEPSIS)
500.0000 mL | Freq: Once | INTRAVENOUS | Status: AC
  Administered 2014-01-09: 500 mL via INTRAVENOUS

## 2014-01-09 MED ORDER — CHLORHEXIDINE GLUCONATE CLOTH 2 % EX PADS
6.0000 | MEDICATED_PAD | Freq: Every day | CUTANEOUS | Status: AC
  Administered 2014-01-09 – 2014-01-13 (×5): 6 via TOPICAL

## 2014-01-09 MED ORDER — BOOST / RESOURCE BREEZE PO LIQD
1.0000 | Freq: Three times a day (TID) | ORAL | Status: DC
  Administered 2014-01-09 – 2014-01-20 (×31): 1 via ORAL

## 2014-01-09 MED ORDER — MUPIROCIN 2 % EX OINT
1.0000 "application " | TOPICAL_OINTMENT | Freq: Two times a day (BID) | CUTANEOUS | Status: AC
  Administered 2014-01-09 – 2014-01-13 (×10): 1 via NASAL
  Filled 2014-01-09 (×2): qty 22

## 2014-01-09 NOTE — Progress Notes (Signed)
INITIAL NUTRITION ASSESSMENT  DOCUMENTATION CODES Per approved criteria  -Severe malnutrition in the context of chronic illness   INTERVENTION: ProStat 30 ml  QID (each 30 ml provides 100 kcal, 15 gr protein)   Resource Breeze po TID pending diet advancement, each supplement provides 250 kcal and 9 grams of protein   NUTRITION DIAGNOSIS: Inadequate oral intake related to altered GI function as evidenced by  N/V diarrhea for week prior to admission.  Goal: Pt to meet >/= 90% of their estimated nutrition needs    Monitor:  Diet advancement, percent po intake, labs and wt trends   Reason for Assessment: Consult to assess nutrition status/requirements  80 y.o. female  Admitting Dx: Sepsis  ASSESSMENT: Pt is resident of [facility]. She has hx of CHF and left hip fx, failure to thrive. She  presented to ED with c/o nausea, vomiting and intermittent diarrhea for 5-6 days. Pt  daughter says pt has been declining since March and expressed concern that her mother may also be suffering from depression. Decreased ability to ambulate due to hip pain. Pt usual diet is regular, feeds herself. Pt  weight is significantly above usual (increased 6 kg, 7.7% in 1 week). She is at risk for skin breakdown due to limited mobility and compromised nutritional status.  Nutrition Focused Physical Exam:  Subcutaneous Fat:  Orbital Region: moderate wasting Upper Arm Region: WDL Thoracic and Lumbar Region: WDL  Muscle:  Temple Region: moderate wasting Clavicle Bone Region: severe wasting Clavicle and Acromion Bone Region: moderate wasting Scapular Bone Region: not assessed Dorsal Hand: mild-moderate wasting Patellar Region: WDL Anterior Thigh Region: moderate wasting Posterior Calf Region: moderate wasting  Edema: mild   Pt meets criteria for severe MALNUTRITION in the context of chronic illness as evidenced by energy intake </= 75% for >/= 1 month and severe muscle wasting.   Height: Ht Readings  from Last 1 Encounters:  01/08/14 5\' 7"  (1.702 m)    Weight: Wt Readings from Last 1 Encounters:  01/08/14 180 lb 5.4 oz (81.8 kg)    Ideal Body Weight: 135# (61 kg)  % Ideal Body Weight: 133%  Wt Readings from Last 10 Encounters:  01/08/14 180 lb 5.4 oz (81.8 kg)  01/01/14 167 lb (75.751 kg)  12/16/13 167 lb (75.751 kg)  11/11/13 167 lb (75.751 kg)  10/26/13 167 lb (75.751 kg)  10/26/13 167 lb (75.751 kg)  10/25/13 165 lb (74.844 kg)  10/24/13 165 lb (74.844 kg)  10/21/13 165 lb (74.844 kg)  10/18/13 165 lb (74.844 kg)    Usual Body Weight: 165-170#  % Usual Body Weight: 109%  BMI:  Body mass index is 28.24 kg/(m^2).overweight  Estimated Nutritional Needs: Kcal: 3710-6269 Protein: 90-114 gr Fluid: 1.7-1.8 liters daily  Skin: stage 1 to sacrum on 01/01/14  Diet Order: Clear Liquid  EDUCATION NEEDS: -No education needs identified at this time   Intake/Output Summary (Last 24 hours) at 01/09/14 1046 Last data filed at 01/09/14 0700  Gross per 24 hour  Intake    875 ml  Output      0 ml  Net    875 ml    Last BM: 01/08/14  Labs:   Recent Labs Lab 01/08/14 1617 01/09/14 0734  NA 135* 136*  K 5.3 4.4  CL 101 104  CO2 19 16*  BUN 76* 69*  CREATININE 1.48* 1.26*  CALCIUM 8.7 8.4  GLUCOSE 106* 83    CBG (last 3)  No results found for this basename: GLUCAP,  in the last 72 hours  Scheduled Meds: . acidophilus  1 capsule Oral Daily  . ceFEPime (MAXIPIME) IV  2 g Intravenous Q24H  . Chlorhexidine Gluconate Cloth  6 each Topical Q0600  . cholecalciferol  1,000 Units Oral Daily  . feeding supplement (PRO-STAT SUGAR FREE 64)  30 mL Oral QID  . ferrous sulfate  325 mg Oral BID WC  . levothyroxine  25 mcg Oral QAC breakfast  . mupirocin ointment  1 application Nasal BID  . pantoprazole (PROTONIX) IV  40 mg Intravenous Q12H  . rivaroxaban  20 mg Oral Q supper  . sodium chloride  3 mL Intravenous Q12H  . vancomycin  1,250 mg Intravenous Q24H  .  cyanocobalamin  500 mcg Oral Daily  . ascorbic acid  500 mg Oral BID    Continuous Infusions: . sodium chloride 125 mL/hr at 01/08/14 2329    Past Medical History  Diagnosis Date  . CHF (congestive heart failure)   . Hypertension   . Arthritis   . Kidney stone   . Pneumonia     20 years ago  . DVT (deep venous thrombosis) 10/14/13    left leg  . Fracture of wrist 10/18/13    right  . Hip fracture, left   . Deep vein blood clot of left lower extremity   . Hemorrhoids     large per colonoscopy 3/15  . Hiatal hernia     per endo 3/15  . Erosive gastritis     mild per endo 3/15    Past Surgical History  Procedure Laterality Date  . Appendectomy      as teenager  . Kidney stone surgery  jan 2012    stone remopved left side  . Hip pinning,cannulated  05/23/2011    Procedure: CANNULATED HIP PINNING;  Surgeon: Arther Abbott, MD;  Location: [facility];  Service: Orthopedics;  Laterality: Left;  late entry: updated foley documentation  . Hip open reduction Left 2012  . Esophagogastroduodenoscopy  April 2003    Dr. Gala Romney: 2 polyps proximal stomach, moderate sized hiatal hernia  . Colonoscopy  April 2003    Dr. Gala Romney: left-sided transverse diverticula  . Colonoscopy with esophagogastroduodenoscopy (egd) N/A 10/28/2013    Procedure: COLONOSCOPY WITH ESOPHAGOGASTRODUODENOSCOPY (EGD);  Surgeon: Danie Binder, MD;  Location: [facility];  Service: Endoscopy;  Laterality: N/A;    Colman Cater MS,RD,CSG,LDN Office: [phone] Pager: [phone]

## 2014-01-09 NOTE — Progress Notes (Signed)
  Echocardiogram 2D Echocardiogram has been performed.  Su Grand Giovoni Bunch 01/09/2014, 9:45 AM

## 2014-01-09 NOTE — Progress Notes (Signed)
Dr. Marin Comment is notified about pt's low BP. Order received and followed. Continue closely to monitor the pt

## 2014-01-09 NOTE — Clinical Social Work Psychosocial (Signed)
Clinical Social Work Department BRIEF PSYCHOSOCIAL ASSESSMENT 01/09/2014  Patient:  Holly Sloan, Holly Sloan     Account Number:  [id number]     Admit date:  01/08/2014  Clinical Social Worker:  Edwyna Shell, [place]  Date/Time:  01/09/2014 10:00 AM  Referred by:  CSW  Date Referred:  01/09/2014 Referred for  SNF Placement   Other Referral:   Interview type:  Patient Other interview type:    PSYCHOSOCIAL DATA Living Status:  FACILITY Admitted from facility:  [facility] Level of care:  [place] Primary support name:  Chauncey Reading Primary support relationship to patient:  CHILD, ADULT Degree of support available:   From [facility], has family and friends who visit regularly and are supportive.    CURRENT CONCERNS Current Concerns  Post-Acute Placement   Other Concerns:    SOCIAL WORK ASSESSMENT / PLAN CSW met w patient at bedside, patient is oriented x4, patient very sleepy but able to converse when aroused. Confirms that she is current resident of [facility], "I guess I will go back there - I have to."  Patient is undergoing rehab, awaiting hip replacement surgery - "I felt like I will never get that surgery, it's taking such a long time." Discouraged about setbacks in rehab process."  Has 3 daughters who live locally, plans to live w one of them when released from rehab post surgery. Surgery is not scheduled at this time due to her medical condition.    CSW left messages for daughters listed on face sheet.  Per patient, none are reachable during the day as they are substitute teachers [employer]) or postal service workers [name] or [place]).  Patient had friend visiting.  Friend says "I am a long time friend, like a sister."  Expressed concern about patient's condition.  Says she visited her at [place] yesterday and noted patient was much less responsive, alerted RN and patient was subsequently admitted to [facility]. Says daughters "may not get it" - concerned that  family and patient do not have a realistic picture of patient's condition and prognosis.  Fears patient may not be able to have surgery at any time.  Says daughters are very supportive.    Patient confirms that she wants to return to [facility], [facility] admissions (Debbie) agreeable.  Says patient is now in [place] pending status.  She is not using her Medicare rehab days because she is awaiting hip replacement and cannot get out of bed to rehab until that procedure is completed.  Facility is willing to take patient back at discharge when medically ready.  CSW will continue to update SNF.   Assessment/plan status:  Psychosocial Support/Ongoing Assessment of Needs Other assessment/ plan:   Information/referral to community resources:   Return to [facility]    PATIENT'S/FAMILY'S RESPONSE TO PLAN OF CARE: Patient reluctantly agreeable to return to SNF, voices frustration about length of time required for rehab process, setbacks, overall medical condition.        Edwyna Shell, LCSW Clinical Social Worker [phone])

## 2014-01-09 NOTE — Care Management Note (Addendum)
    Page 1 of 1   01/20/2014     1:46:15 PM CARE MANAGEMENT NOTE 01/20/2014  Patient:  Holly Sloan, Holly Sloan   Account Number:  [id number]  Date Initiated:  01/09/2014  Documentation initiated by:  Theophilus Kinds  Subjective/Objective Assessment:   Pt admitted from [facility] with sepsis. Pt will return to facility at discharge.     Action/Plan:   CSW aware and will arrange discharge to facility when medically stable.   Anticipated DC Date:  01/16/2014   Anticipated DC Plan:  SKILLED NURSING FACILITY  In-house referral  Clinical Social Worker      DC Planning Services  CM consult      Choice offered to / List presented to:             Status of service:  Completed, signed off Medicare Important Message given?  YES (If response is "NO", the following Medicare IM given date fields will be blank) Date Medicare IM given:  01/13/2014 Date Additional Medicare IM given:  01/20/2014  Discharge Disposition:  [facility]  Per UR Regulation:    If discussed at Long Length of Stay Meetings, dates discussed:   01/13/2014  01/15/2014  01/20/2014    Comments:  01/20/14 Aanika Defoor Dellia Nims RN BSN CM Spoke to [employer] at [facility]. She is aware of pending DC  01/09/14 0910 Christinia Gully, RN BSN CM

## 2014-01-09 NOTE — Progress Notes (Signed)
UR chart review completed.  

## 2014-01-09 NOTE — Progress Notes (Signed)
PT Cancellation Note  Patient Details Name: Holly Sloan MRN: [id number] DOB: 07/30/1929   Cancelled Treatment:    Reason Eval/Treat Not Completed: Patient not medically ready  Pt continues to be hypotensive this am, improving but BPs  remain low  87/47 to 87/64 this am; Will attempt PT eval at later time as schedule permits.   Neil Crouch 01/09/2014, 9:09 AM

## 2014-01-09 NOTE — Progress Notes (Signed)
PROGRESS NOTE  Holly Sloan MPN:[id number] DOB: [birth year] DOA: 01/08/2014 PCP: [name], MD  Summary: 80 year old woman resident of [facility] facility underwent IVC filter placement 5/21, since that time has had numerous episodes of nausea, vomiting and diarrhea. In the emergency department found to have acute renal failure, UTI, hypotension suggesting sepsis. Patient has declined steadily over the last few months and is essentially non-ambulatory because of left hip pain. Status post IVC filter placement on 01/01/2013  Assessment/Plan: 1. Sepsis with hypotension, lactic acidosis, AG metabolic acidosis, secondary to UTI. No leukocytosis, afebrile. Blood pressure remains low but somewhat improved with fluids. 2. UTI. Urine culture pending. 3. Acute renal failure. Likely secondary to vomiting, diarrhea, sepsis, diuretics. Improved today with IV fluids. 4. AG-metabolic acidosis suspect secondary to lactic acidosis secondary to sepsis. Follow clinically. 5. Nausea, vomiting, intermittent diarrhea for 5-6 days appears resolved. 6. Intermittent lower abdominal pain. None currently. No acute findings on CT to explain. 7. Considerable omental portosystemic varices/shunting indicating portal venous hypertension. Etiology unclear. Needs outpatient followup. 8. Failure to thrive. 9. Chronic diastolic congestive heart failure. Appears intravasculary dry, despite obvious 3rd spacing 10. History of DVT x2, most recently 10/2013, status post IVC filter placement 5/21, on Xarelto. 11. History of rectal bleeding 10/2013 12. History of left hip fracture 0086, complicated by avascular necrosis, total hip replacement being planned. 13. Right wrist fracture 10/2013 placement being planned 14. Hypoalbuminemia from chronic disease, severe, suspect severe protein calorie malnutrition. She is following with oncology for this matter. Dysphagia. Continue dysphagia 3 diet.   Remain in stepdown unit, critically  ill, prognosis guarded. Continue IVF, cefepime, add vanc, f/u UC  Send blood cultures  Send stool studies  BMP, CBC, lactic acid in AM  Insert PICC for multiple abx, IVF  Echocardiogram pending, f/u EKG  Insert foley, strict I/O  Update family when available   Code Status: full code DVT prophylaxis: Xarelto Family Communication: none present Disposition Plan: likely return to [facility]  Holly Hodgkins, MD  [facility]  Pager [phone] If 7PM-7AM, please contact night-coverage at [email or web address], password [facility] 01/09/2014, 8:33 AM  LOS: 1 day   Consultants:    Procedures:  Echo pending  Antibiotics:  Cefepime 5/28 >>   HPI/Subjective: Hypotensive overnight, refused lab draws/blood cultures.  No other issues overnight per RN at bedside. No family present.  Patient awake, alert, denies complaints, specifically no CP, SOB, n/v/abdominal pain.  Objective: Filed Vitals:   01/09/14 0530 01/09/14 0600 01/09/14 0700 01/09/14 0800  BP: 75/40 85/50 85/47  87/64  Pulse:      Temp:    97.4 F (36.3 C)  TempSrc:    Oral  Resp: 11 9 6 13   Height:      Weight:      SpO2: 96%       Intake/Output Summary (Last 24 hours) at 01/09/14 0833 Last data filed at 01/09/14 0700  Gross per 24 hour  Intake    875 ml  Output      0 ml  Net    875 ml     Filed Weights   01/08/14 1354 01/08/14 2253  Weight: 74.844 kg (165 lb) 81.8 kg (180 lb 5.4 oz)    Exam:   Afebrile. BP 80-90s. No hypoxia, normal respiratory rate.  Gen. Despite hypotension and sepsis, awake and alert and able to participate in examination and history. She appears acutely and chronically ill.  Eyes. PERRL. normal lids, irises  ENT. Lips and tongue appear unremarkable.  Neck  no lymphadenopathy or masses.  Cardiovascular regular rate and rhythm. No murmur rub or gallop. 2+ bilateral lower extremity edema the thigh. There is also bilateral upper extremity edema.  Respiratory is clear to  auscultation bilaterally with no frank wheezes, rales or rhonchi. Normal respiratory effort and work of breathing.  Abdomen is obese, soft, nontender, nondistended. Skin appears unremarkable.  Skin. There are no rash or lesions. No sacral breakdown. Grossly normal perfusion of the extremities. Perineum appears unremarkable. Exam and with chaperone RN Nedine present.  Musculoskeletal. Moves all extremities to command.  Neurologic exam grossly nonfocal.  Data Reviewed:  Multiple voids, incontinent  Basic metabolic panel shows modest improvement in acute renal failure, BUN and creatinine trending downward.  Lactic acidosis noted last night.  CBC within normal limits   urinalysis was grossly positive, urine cultures pending.  CT of the abdomen and pelvis revealed considerable third spacing, considerable omental portosystemic varices and shunting and again portal venous hypertension as well as several incidental findings.   Scheduled Meds: . sodium chloride   Intravenous STAT  . acidophilus  1 capsule Oral Daily  . ceFEPime (MAXIPIME) IV  2 g Intravenous Q24H  . Chlorhexidine Gluconate Cloth  6 each Topical Q0600  . cholecalciferol  1,000 Units Oral Daily  . feeding supplement (PRO-STAT SUGAR FREE 64)  30 mL Oral QID  . ferrous sulfate  325 mg Oral BID WC  . levothyroxine  25 mcg Oral QAC breakfast  . mupirocin ointment  1 application Nasal BID  . pantoprazole (PROTONIX) IV  40 mg Intravenous Q12H  . rivaroxaban  20 mg Oral Q supper  . sodium chloride  3 mL Intravenous Q12H  . cyanocobalamin  500 mcg Oral Daily  . ascorbic acid  500 mg Oral BID   Continuous Infusions: . sodium chloride 125 mL/hr at 01/08/14 2329    Principal Problem:   Sepsis Active Problems:   HYPERTENSION, PULMONARY   DVT   UTI (lower urinary tract infection)   Hypothyroidism   Avascular necrosis of bone of left hip   Failure to thrive   Malnutrition   Diastolic CHF   HTN (hypertension)   High  anion gap metabolic acidosis   Acute renal failure   Time spent 35  minutes

## 2014-01-09 NOTE — Progress Notes (Signed)
ANTIBIOTIC CONSULT NOTE - FOLLOW UP  Pharmacy Consult for Vancomycin, Cefepime Indication: rule out sepsis, UTI  No Known Allergies  Patient Measurements: Height: 5\' 7"  (170.2 cm) Weight: 180 lb 5.4 oz (81.8 kg) IBW/kg (Calculated) : 61.6  Vital Signs: Temp: 97.4 F (36.3 C) (05/29 0800) Temp src: Oral (05/29 0800) BP: 95/47 mmHg (05/29 1000) Intake/Output from previous day: 05/28 0701 - 05/29 0700 In: 875 [I.V.:875] Out: -  Intake/Output from this shift:    Labs:  Recent Labs  01/08/14 1617 01/09/14 0734  WBC 9.7 9.2  HGB 13.0 12.9  PLT 392 386  CREATININE 1.48* 1.26*   Estimated Creatinine Clearance: 37.9 ml/min (by C-G formula based on Cr of 1.26). No results found for this basename: VANCOTROUGH, Corlis Leak, VANCORANDOM, Blaine, GENTPEAK, GENTRANDOM, TOBRATROUGH, TOBRAPEAK, TOBRARND, AMIKACINPEAK, AMIKACINTROU, AMIKACIN,  in the last 72 hours   Microbiology: Recent Results (from the past 720 hour(s))  MRSA PCR SCREENING     Status: Abnormal   Collection Time    01/02/14 11:08 AM      Result Value Ref Range Status   MRSA by PCR POSITIVE (*) NEGATIVE Final   Comment:            The GeneXpert MRSA Assay (FDA     approved for NASAL specimens     only), is one component of a     comprehensive MRSA colonization     surveillance program. It is not     intended to diagnose MRSA     infection nor to guide or     monitor treatment for     MRSA infections.     RESULT CALLED TO, READ BACK BY AND VERIFIED WITH:     TRACEY,J.RN 1446 ON 01/02/14 BY MCCOY,N.     RESULT CALLED TO, READ BACK BY AND VERIFIED WITH:     TRACEY,J. RN @ 1446 ON 01/02/14 BY MCCOY,N.     RESULT CALLED TO, READ BACK BY AND VERIFIED WITH:     TRACEY,J. RN @ 3244 ON 01/02/14 BY MCCOY,N.     Anti-infectives   Start     Dose/Rate Route Frequency Ordered Stop   01/09/14 1000  vancomycin (VANCOCIN) 1,250 mg in sodium chloride 0.9 % 250 mL IVPB     1,250 mg 166.7 mL/hr over 90 Minutes Intravenous  Every 24 hours 01/09/14 0923     01/08/14 2300  ceFEPIme (MAXIPIME) 2 g in dextrose 5 % 50 mL IVPB     2 g 100 mL/hr over 30 Minutes Intravenous Every 24 hours 01/08/14 2118     01/08/14 1830  cefTRIAXone (ROCEPHIN) 1 g in dextrose 5 % 50 mL IVPB     1 g 100 mL/hr over 30 Minutes Intravenous  Once 01/08/14 1821 01/08/14 1940      Assessment: 80 yo F admitted from NH admitted with UTI.  Concern for sepsis given hypotension.   She is afebrile with normal WBC.  Cx data pending.  Scr is elevated above patient's baseline, but trending down.  Estimated CrCl ~ 35-33ml/min.    Cefepime 5/28>> Vancomycin 5/29>>  Goal of Therapy:  Vancomycin trough level 15-20 mcg/ml Eradicate infection.  Plan:  Continue Cefepime 2gm IV q24h Vancomycin 1250mg  IV q24h Check Vancomycin trough at steady state Monitor renal function and cx data   Lavonia Drafts Mayia Megill 01/09/2014,11:04 AM

## 2014-01-10 DIAGNOSIS — E43 Unspecified severe protein-calorie malnutrition: Secondary | ICD-10-CM | POA: Diagnosis present

## 2014-01-10 LAB — COMPREHENSIVE METABOLIC PANEL
ALT: 12 U/L (ref 0–35)
AST: 16 U/L (ref 0–37)
Albumin: 0.9 g/dL — ABNORMAL LOW (ref 3.5–5.2)
Alkaline Phosphatase: 98 U/L (ref 39–117)
BUN: 65 mg/dL — AB (ref 6–23)
CHLORIDE: 110 meq/L (ref 96–112)
CO2: 13 meq/L — AB (ref 19–32)
CREATININE: 1.1 mg/dL (ref 0.50–1.10)
Calcium: 7.5 mg/dL — ABNORMAL LOW (ref 8.4–10.5)
GFR calc Af Amer: 53 mL/min — ABNORMAL LOW (ref >90)
GFR, EST NON AFRICAN AMERICAN: 45 mL/min — AB (ref >90)
Glucose, Bld: 117 mg/dL — ABNORMAL HIGH (ref 70–99)
Potassium: 4.1 mEq/L (ref 3.7–5.3)
Sodium: 137 mEq/L (ref 137–147)
Total Protein: 2.9 g/dL — ABNORMAL LOW (ref 6.0–8.3)

## 2014-01-10 LAB — CBC
HEMATOCRIT: 35.9 % — AB (ref 36.0–46.0)
Hemoglobin: 11.8 g/dL — ABNORMAL LOW (ref 12.0–15.0)
MCH: 29.5 pg (ref 26.0–34.0)
MCHC: 32.9 g/dL (ref 30.0–36.0)
MCV: 89.8 fL (ref 78.0–100.0)
PLATELETS: 404 10*3/uL — AB (ref 150–400)
RBC: 4 MIL/uL (ref 3.87–5.11)
RDW: 16.8 % — ABNORMAL HIGH (ref 11.5–15.5)
WBC: 9.6 10*3/uL (ref 4.0–10.5)

## 2014-01-10 LAB — LACTIC ACID, PLASMA: Lactic Acid, Venous: 1.6 mmol/L (ref 0.5–2.2)

## 2014-01-10 MED ORDER — DEXTROSE 5 % IV SOLN
INTRAVENOUS | Status: DC
  Administered 2014-01-10 – 2014-01-11 (×2): via INTRAVENOUS
  Filled 2014-01-10 (×4): qty 150

## 2014-01-10 MED ORDER — SODIUM CHLORIDE 0.9 % IV BOLUS (SEPSIS)
1000.0000 mL | Freq: Once | INTRAVENOUS | Status: AC
  Administered 2014-01-10: 1000 mL via INTRAVENOUS

## 2014-01-10 MED ORDER — SODIUM CHLORIDE 0.9 % IV BOLUS (SEPSIS)
500.0000 mL | Freq: Once | INTRAVENOUS | Status: AC
  Administered 2014-01-10: 500 mL via INTRAVENOUS

## 2014-01-10 NOTE — Progress Notes (Signed)
Provider, Susanne Borders,  notified of continued hypotension.  Pt repositioned every two hours and sleeping soundly after repositioning with SBP 73/44 (50) at 0000 with easy arousal to voice, denies symptoms of hypotension, with recheck BP 99/56 (66).  Pt with adequate urine output of 50cc/hr 0100-0200 but continues to have hypotension.  Provider order 1000 ml NS bolus, will give and continue to monitor.

## 2014-01-10 NOTE — Progress Notes (Signed)
01/10/14 0430  Vitals  BP ! 80/38 mmHg  MAP (mmHg) 49  ECG Heart Rate 76  Pt remains hypotensive after 1000 ml NS bolus while she is asleep.  MD P. Le notified and ordered another 500 ml NS bolus.  Provider notified of pt BP 98/51 (63) while awake and maintains a urine output of 61ml/hr for the past 3 hours.  Will administer NS bolus and continue to monitor.

## 2014-01-10 NOTE — Progress Notes (Signed)
PROGRESS NOTE  Holly Sloan YKD:[id number] DOB: [birth year] DOA: 01/08/2014 PCP: Holly Gravel, MD  Summary: 80 year old woman resident of [facility] facility underwent IVC filter placement 5/21, since that time has had numerous episodes of nausea, vomiting and diarrhea. In the emergency department found to have acute renal failure, UTI, hypotension suggesting sepsis. Patient has declined steadily over the last few months and is essentially non-ambulatory because of left hip pain. Status post IVC filter placement on 01/01/2013  Assessment/Plan: 1. Sepsis with hypotension, lactic acidosis, AG metabolic acidosis, secondary to UTI. No leukocytosis, remains afebrile. Blood pressure remains low but somewhat improved with fluids. 2. UTI. Urine culture pending. 3. Acute renal failure. Likely secondary to vomiting, diarrhea, sepsis, diuretics. Appears nearly resolved. 4. AG-metabolic acidosis suspect secondary to lactic acidosis secondary to sepsis. Follow clinically. 5. Nausea, vomiting, intermittent diarrhea for 5-6 days. Resolved. Tolerating diet. 6. Intermittent lower abdominal pain. None since hospitalization. No acute findings on CT to explain. 7. Considerable omental portosystemic varices/shunting indicating portal venous hypertension. Etiology unclear. Needs outpatient followup. 8. Failure to thrive. Somewhat improved oral intake. 9. Chronic diastolic congestive heart failure. Appears intravasculary dry, despite obvious 3rd spacing 10. History of DVT x2, most recently 10/2013, status post IVC filter placement 5/21, on Xarelto. 11. History of rectal bleeding 10/2013 12. History of left hip fracture 3976, complicated by avascular necrosis, total hip replacement being planned. 13. Hypoalbuminemia from chronic disease, severe.  14. Dysphagia. Continue dysphagia 3 diet. 15. Severe malnutrition in context of chronic illness   Somewhat better, though still hypotensive at times.prognosis remains  guarded.  Keep in SDU, continue abx, f/u cultures  Advance diet   Add bicarb to IVF  Case was discussed in detail with daughter Holly Sloan at bedside last evening, guarded prognosis discussed in current treatment plan. Discussed again today with daugther at bedside.   Code Status: full code DVT prophylaxis: Xarelto Family Communication: none present Disposition Plan: likely return to [facility]  [name], MD  [facility]  Pager [phone] If 7PM-7AM, please contact night-coverage at [email or web address], password [facility] 01/10/2014, 7:41 AM  LOS: 2 days   Consultants:    Procedures:  2-D echocardiogram: Left ventricular ejection fraction 55-60%. Grade 1 diastolic dysfunction.  Antibiotics:  Cefepime 5/28 >>   HPI/Subjective: Hypotensive overnight but excellent urine output.  No complaints. No cp/sob/n/v. No pain. Tolerating liquids.  Objective: Filed Vitals:   01/10/14 0500 01/10/14 0530 01/10/14 0600 01/10/14 0630  BP: 72/34 107/66 90/50 91/48   Pulse:      Temp:      TempSrc:      Resp: 10 16 11 16   Height:      Weight: 87.5 kg (192 lb 14.4 oz)     SpO2: 100%  97%     Intake/Output Summary (Last 24 hours) at 01/10/14 0741 Last data filed at 01/10/14 0600  Gross per 24 hour  Intake   4725 ml  Output    602 ml  Net   4123 ml     Filed Weights   01/08/14 1354 01/08/14 2253 01/10/14 0500  Weight: 74.844 kg (165 lb) 81.8 kg (180 lb 5.4 oz) 87.5 kg (192 lb 14.4 oz)    Exam:   Afebrile. Intermittent hypotension, predominantly systolic 90W. No hypoxia or tachypnea. Gen. Appears calm and comfortable. Resting in bed. Psych. Alert, follows commands, participates in exam and interview normally. Eyes. PERRL, normal lids, irises, wears glasses ENT. Grossly normal Neck grossly normal Cardiovascular RRR no m/r/g. Telemetry SR. Anasarca slightly improved esp  BUE. Still with 3+ pitting edeman BLE up to proximal thigh. Grossly normal perfusion of  extremities. Respiratory CTA bilaterally with excellent air movement. Normal resp effort. Abdomen soft, ntnd, obese Skin. Grossly unremarkable Musculoskeletal. Moves all ext to commands Neurologic grossly non-focal   Data Reviewed:  I/O: 600+, multiple voids, Foley catheter placed last night  Chemistry: Acute renal failure improved with normalization of creatinine 1.10. BUN slightly improved, 65. CO2 13, anion gap is closed.  Heme: No leukocytosis. Hemoglobin stable.  ID: GI pathogen panel pending. Urine culture pending. Patient refused blood cultures.  Other: Lactic acid normal  Scheduled Meds: . acidophilus  1 capsule Oral Daily  . ceFEPime (MAXIPIME) IV  2 g Intravenous Q24H  . Chlorhexidine Gluconate Cloth  6 each Topical Q0600  . cholecalciferol  1,000 Units Oral Daily  . feeding supplement (PRO-STAT SUGAR FREE 64)  30 mL Oral QID  . feeding supplement (RESOURCE BREEZE)  1 Container Oral TID BM  . ferrous sulfate  325 mg Oral BID WC  . levothyroxine  25 mcg Oral QAC breakfast  . mupirocin ointment  1 application Nasal BID  . pantoprazole (PROTONIX) IV  40 mg Intravenous Q12H  . rivaroxaban  20 mg Oral Q supper  . sodium chloride  3 mL Intravenous Q12H  . vancomycin  1,250 mg Intravenous Q24H  . cyanocobalamin  500 mcg Oral Daily  . ascorbic acid  500 mg Oral BID   Continuous Infusions: . sodium chloride 125 mL/hr at 01/10/14 0000    Principal Problem:   Sepsis Active Problems:   HYPERTENSION, PULMONARY   DVT   UTI (lower urinary tract infection)   Hypothyroidism   Avascular necrosis of bone of left hip   Failure to thrive   Malnutrition   Diastolic CHF   HTN (hypertension)   High anion gap metabolic acidosis   Acute renal failure   Time spent 25 minutes

## 2014-01-11 DIAGNOSIS — E8809 Other disorders of plasma-protein metabolism, not elsewhere classified: Secondary | ICD-10-CM

## 2014-01-11 LAB — BASIC METABOLIC PANEL
BUN: 64 mg/dL — ABNORMAL HIGH (ref 6–23)
CO2: 20 mEq/L (ref 19–32)
CREATININE: 1.09 mg/dL (ref 0.50–1.10)
Calcium: 7.9 mg/dL — ABNORMAL LOW (ref 8.4–10.5)
Chloride: 106 mEq/L (ref 96–112)
GFR calc Af Amer: 53 mL/min — ABNORMAL LOW (ref >90)
GFR, EST NON AFRICAN AMERICAN: 46 mL/min — AB (ref >90)
GLUCOSE: 123 mg/dL — AB (ref 70–99)
Potassium: 3.5 mEq/L — ABNORMAL LOW (ref 3.7–5.3)
SODIUM: 138 meq/L (ref 137–147)

## 2014-01-11 LAB — CORTISOL: Cortisol, Plasma: 16.1 ug/dL

## 2014-01-11 MED ORDER — HYDROCORTISONE NA SUCCINATE PF 100 MG IJ SOLR
50.0000 mg | Freq: Four times a day (QID) | INTRAMUSCULAR | Status: DC
  Administered 2014-01-11 – 2014-01-13 (×9): 50 mg via INTRAVENOUS
  Filled 2014-01-11 (×9): qty 2

## 2014-01-11 MED ORDER — DEXAMETHASONE SODIUM PHOSPHATE 4 MG/ML IJ SOLN
4.0000 mg | Freq: Once | INTRAMUSCULAR | Status: AC
  Administered 2014-01-11: 4 mg via INTRAVENOUS
  Filled 2014-01-11: qty 1

## 2014-01-11 MED ORDER — FUROSEMIDE 10 MG/ML IJ SOLN
20.0000 mg | Freq: Once | INTRAMUSCULAR | Status: AC
  Administered 2014-01-11: 20 mg via INTRAVENOUS
  Filled 2014-01-11: qty 2

## 2014-01-11 MED ORDER — PANTOPRAZOLE SODIUM 40 MG PO TBEC
40.0000 mg | DELAYED_RELEASE_TABLET | Freq: Every day | ORAL | Status: DC
  Administered 2014-01-11 – 2014-01-13 (×3): 40 mg via ORAL
  Filled 2014-01-11 (×3): qty 1

## 2014-01-11 MED ORDER — POTASSIUM CHLORIDE CRYS ER 20 MEQ PO TBCR
40.0000 meq | EXTENDED_RELEASE_TABLET | Freq: Once | ORAL | Status: AC
  Administered 2014-01-11: 40 meq via ORAL
  Filled 2014-01-11: qty 2

## 2014-01-11 NOTE — Progress Notes (Signed)
PROGRESS NOTE  Holly Sloan [id number]:[id number] DOB: 02-26-1929 DOA: 01/08/2014 PCP: [name], MD  Summary: 80 year old woman resident of [place] facility underwent IVC filter placement 5/21, since that time has had numerous episodes of nausea, vomiting and diarrhea. In the emergency department found to have acute renal failure, UTI, hypotension suggesting sepsis. Patient has declined steadily over the last few months and is essentially non-ambulatory because of left hip pain. Status post IVC filter placement on 01/01/2013  Assessment/Plan: 1. Sepsis with hypotension, lactic acidosis, AG metabolic acidosis, secondary to UTI. No leukocytosis, remains afebrile. Blood pressure remains low but somewhat improved with fluids. Nontoxic good condition remains tenuous. Continue antibiotics. 2. UTI GNR. Urine culture pending. 3. Acute renal failure. Likely secondary to vomiting, diarrhea, sepsis, diuretics. Creatinine within normal limits. BUN slowly trending downwards. 4. AG-metabolic acidosis suspect secondary to lactic acidosis secondary to sepsis. Resolved. 5. Nausea, vomiting, intermittent diarrhea for 5-6 days. Resolved since hospitalization. 6. Intermittent lower abdominal pain. None since hospitalization. No acute findings on CT to explain. 7. Considerable omental portosystemic varices/shunting indicating portal venous hypertension. Etiology unclear. Will ask GI for comment. 8. Failure to thrive. Fair intake during this hospitalization. 9. Chronic diastolic congestive heart failure. Appears euvolemic. 10. History of DVT x2, most recently 10/2013, status post IVC filter placement 5/21, on Xarelto. 11. History of rectal bleeding 10/2013 no evidence of recurrence 12. History of left hip fracture 5366, complicated by avascular necrosis, total hip replacement being planned. 13. Hypoalbuminemia from chronic disease, severe. Evaluated by oncology in the past. 14. Dysphagia. Continue dysphagia 3  diet. 15. Severe malnutrition in context of chronic illness   Overall no sig change, remains with borderline low BP, but not toxic. Plan continue abx, f/u UC. Recovery will be difficult with chronically and severely low albumin. Prognosis remains guarded. Plan check cortisol, one dose of Decadron, consider stress dose steroids although no definite signs of adrenal insufficiency.  Trial low dose Lasix if BP can tolerate, but doubt sig improvement in edema present on admission secondary to low albumin.  Replace potassium.  Stop vancomycin with GNR in urine   Request GI evaluation in the morning to further comment on CT  Long discussion with daughter at bedside. We discussed acute and chronic issues, we have reviewed critical internal nose with sepsis. Compounded by poor nutrition and chronic hypoalbuminemia eating for at least 4 years and difficulty managing anasarca state.  Code Status: full code DVT prophylaxis: Xarelto Family Communication: none present Disposition Plan: likely return to [facility]  Holly Hodgkins, MD  [facility]  Pager [phone] If 7PM-7AM, please contact night-coverage at [email or web address], password [facility] 01/11/2014, 8:28 AM  LOS: 3 days   Consultants:    Procedures:  2-D echocardiogram: Left ventricular ejection fraction 55-60%. Grade 1 diastolic dysfunction.  Antibiotics:  Cefepime 5/28 >>   Vancomycin 5/29 >>  HPI/Subjective: No issues overnight. One BM. No n/v/d. Tolerating diet. No abd pain.  Objective: Filed Vitals:   01/11/14 0400 01/11/14 0500 01/11/14 0600 01/11/14 0800  BP: 99/58 93/52 87/48    Pulse:      Temp: 97.7 F (36.5 C)   97.6 F (36.4 C)  TempSrc: Axillary   Axillary  Resp: 14 10 11    Height:      Weight:  94.5 kg (208 lb 5.4 oz)    SpO2:        Intake/Output Summary (Last 24 hours) at 01/11/14 0828 Last data filed at 01/11/14 0600  Gross per 24 hour  Intake 2557.92 ml  Output   1051 ml  Net 1506.92 ml     Filed  Weights   01/08/14 2253 01/10/14 0500 01/11/14 0500  Weight: 81.8 kg (180 lb 5.4 oz) 87.5 kg (192 lb 14.4 oz) 94.5 kg (208 lb 5.4 oz)    Exam:   Blood pressure remains borderline, afebrile with stable vital signs otherwise. Gen. Appears calm and comfortable. Appears chronically ill/acutely ill but not toxic Psych. Speech fluent and clear, grossly normal mood and affect, participates in exam Eyes. PERRL, normal lids, irises ENT. Dry MM. Cardiovascular. RRR no m/r/g. Anasarca without sig change, up to abdomen, improved in arms. Respiratory. CTA bilaterally, no w/r/r. Normal resp effort. Abdomen. Soft, obese, nt,nd. Subq edema noted Skin. No rash or induration seen Musculoskeletal. Moves all extremities to command Neurologic. Grossly nonfocal  Data Reviewed:  I/O: 360 PO/1050. +6 L.  Chemistry: Creatinine at baseline, 1.09. BUN without significant change. Potassium slightly low, 3.5.  ID: GI pathogen panel pending. Urine culture >100k GNR. Patient refused blood cultures.  Scheduled Meds: . acidophilus  1 capsule Oral Daily  . ceFEPime (MAXIPIME) IV  2 g Intravenous Q24H  . Chlorhexidine Gluconate Cloth  6 each Topical Q0600  . cholecalciferol  1,000 Units Oral Daily  . feeding supplement (PRO-STAT SUGAR FREE 64)  30 mL Oral QID  . feeding supplement (RESOURCE BREEZE)  1 Container Oral TID BM  . ferrous sulfate  325 mg Oral BID WC  . levothyroxine  25 mcg Oral QAC breakfast  . mupirocin ointment  1 application Nasal BID  . pantoprazole (PROTONIX) IV  40 mg Intravenous Q12H  . rivaroxaban  20 mg Oral Q supper  . sodium chloride  3 mL Intravenous Q12H  . vancomycin  1,250 mg Intravenous Q24H  . cyanocobalamin  500 mcg Oral Daily  . ascorbic acid  500 mg Oral BID   Continuous Infusions: .  sodium bicarbonate  infusion 1000 mL 75 mL/hr at 01/11/14 0600    Principal Problem:   Sepsis Active Problems:   HYPERTENSION, PULMONARY   DVT   UTI (lower urinary tract infection)    Hypothyroidism   Avascular necrosis of bone of left hip   Failure to thrive   Malnutrition   Diastolic CHF   HTN (hypertension)   High anion gap metabolic acidosis   Acute renal failure   Protein-calorie malnutrition, severe   Time spent 35 minutes, greater than 50% counseling

## 2014-01-11 NOTE — Progress Notes (Signed)
The patient is receiving Protonix by the intravenous route.  Based on criteria approved by the Pharmacy and [place], the medication is being converted to the equivalent oral dose form.  These criteria include: -No Active GI bleeding -Able to tolerate diet of full liquids (or better) or tube feeding OR able to tolerate other medications by the oral or enteral route  If you have any questions about this conversion, please contact the Pharmacy Department (ext [phone]).  Thank you.  [place], [facility] 01/11/2014 9:12 AM

## 2014-01-11 NOTE — Progress Notes (Signed)
Dr. Sarajane Jews notified that Holly Sloan is on for GI. Okay to wait until tomorrow for Dr. Gala Romney to be called. Note left for nursing staff.

## 2014-01-12 ENCOUNTER — Encounter (HOSPITAL_COMMUNITY): Payer: Self-pay | Admitting: Gastroenterology

## 2014-01-12 ENCOUNTER — Inpatient Hospital Stay (HOSPITAL_COMMUNITY): Payer: Medicare HMO

## 2014-01-12 DIAGNOSIS — K922 Gastrointestinal hemorrhage, unspecified: Secondary | ICD-10-CM

## 2014-01-12 LAB — GI PATHOGEN PANEL BY PCR, STOOL
C difficile toxin A/B: NEGATIVE
Campylobacter by PCR: NEGATIVE
Cryptosporidium by PCR: NEGATIVE
E coli (ETEC) LT/ST: NEGATIVE
E coli (STEC): NEGATIVE
E coli 0157 by PCR: NEGATIVE
G lamblia by PCR: NEGATIVE
Norovirus GI/GII: NEGATIVE
Rotavirus A by PCR: NEGATIVE
Salmonella by PCR: NEGATIVE
Shigella by PCR: NEGATIVE

## 2014-01-12 LAB — BASIC METABOLIC PANEL
BUN: 62 mg/dL — ABNORMAL HIGH (ref 6–23)
CO2: 19 mEq/L (ref 19–32)
Calcium: 8.4 mg/dL (ref 8.4–10.5)
Chloride: 104 mEq/L (ref 96–112)
Creatinine, Ser: 1.05 mg/dL (ref 0.50–1.10)
GFR calc Af Amer: 56 mL/min — ABNORMAL LOW (ref >90)
GFR calc non Af Amer: 48 mL/min — ABNORMAL LOW (ref >90)
Glucose, Bld: 139 mg/dL — ABNORMAL HIGH (ref 70–99)
Potassium: 4.1 mEq/L (ref 3.7–5.3)
Sodium: 136 mEq/L — ABNORMAL LOW (ref 137–147)

## 2014-01-12 LAB — CBC
HCT: 37.9 % (ref 36.0–46.0)
Hemoglobin: 12.8 g/dL (ref 12.0–15.0)
MCH: 29.7 pg (ref 26.0–34.0)
MCHC: 33.8 g/dL (ref 30.0–36.0)
MCV: 87.9 fL (ref 78.0–100.0)
PLATELETS: 397 10*3/uL (ref 150–400)
RBC: 4.31 MIL/uL (ref 3.87–5.11)
RDW: 16.5 % — ABNORMAL HIGH (ref 11.5–15.5)
WBC: 7.6 10*3/uL (ref 4.0–10.5)

## 2014-01-12 MED ORDER — FUROSEMIDE 10 MG/ML IJ SOLN
20.0000 mg | Freq: Once | INTRAMUSCULAR | Status: AC
  Administered 2014-01-12: 20 mg via INTRAVENOUS
  Filled 2014-01-12: qty 2

## 2014-01-12 NOTE — Clinical Social Work Note (Signed)
CSW met w patient and daughter Fraser Din in room, patient expressed concerns about care issues at current SNF, would like to transfer to [facility].  CSW will contact [facility] to see if this is possible, if not family was advised to continue to support patient in voicing her concerns about care issues.  Family also states they have contact information for SNF Ombudsman and may contact them for further assistance.  CSW advised family and patient that she transmit concerns to [facility] admissions.  Patient appears willing to return to [facility] at discharge, daughter says "we would take her home if we could but she cannot bear any weight on her leg and we are not able to be there all the time."  Wondered about applying for CAP Aide.  CSW encouraged family to explore, but advised that CAP aide wait list may be lengthy.  Edwyna Shell, LCSW Clinical Social Worker [phone])

## 2014-01-12 NOTE — Progress Notes (Signed)
PROGRESS NOTE  GRIZEL VESELY RCV:[id number] DOB: [birth year] DOA: 01/08/2014 PCP: [name], MD  Summary: 80 year old woman resident of [place] facility underwent IVC filter placement 5/21, since that time has had numerous episodes of nausea, vomiting and diarrhea. In the emergency department found to have acute renal failure, UTI, hypotension suggesting sepsis. Patient has declined steadily over the last few months and is essentially non-ambulatory because of left hip pain. Status post IVC filter placement on 01/01/2013  Assessment/Plan: 1. Suspected sepsis with hypotension, lactic acidosis, AG metabolic acidosis on admission, secondary to UTI. No leukocytosis, remains afebrile. Appears stable at this point. 2. Hypotension, persistant. Improved with empiric solu-cortef though cortisol was normal. 3. UTI CITROBACTER FREUNDII and GNR. Urine culture pending. 4. Acute renal failure. Likely secondary to vomiting, diarrhea, sepsis, diuretics. Creatinine within normal limits. BUN slowly trending downwards. 5. Anasarca secondary to longstanding hypoalbuminemia from chronic disease, severe. Evaluated by oncology in the past.  6. AG-metabolic acidosis suspect secondary to lactic acidosis secondary to sepsis. Resolved. 7. Nausea, vomiting, intermittent diarrhea for 5-6 days with intermittent lower abdominal pain. None since admission. No acute findings on CT to explain. 8. Considerable omental portosystemic varices/shunting indicating portal venous hypertension. Etiology unclear. GI consulted. 9. Failure to thrive. Fair intake during this hospitalization. 10. Chronic diastolic congestive heart failure. Stable. 11. History of DVT x2, most recently 10/2013, status post IVC filter placement 5/21, on Xarelto. 12. History of rectal bleeding 10/2013 no evidence of recurrence 13. History of left hip fracture 1025, complicated by avascular necrosis, total hip replacement being planned. 14. Dysphagia.  Continue dysphagia 3 diet. 15. Severe malnutrition in context of chronic illness   Clinically stable with improved BP. Plan continue empiric cefepime pending final results of urine culture.  Continue gentle diuresis, empiric Solu-cortef.  GI evaluation today  BMP in the morning  Transfer to medical bed  Discussed in detail with daughter at bedside. Anasarca is longstanding and will be slow to improve, IF nutrition status and albumin improve.   Code Status: full code DVT prophylaxis: Xarelto Family Communication:  Disposition Plan: likely return to [facility]  [name], MD  [facility]  Pager [phone] If 7PM-7AM, please contact night-coverage at [email or web address], password [facility] 01/12/2014, 8:31 AM  LOS: 4 days   Consultants:    Procedures:  2-D echocardiogram: Left ventricular ejection fraction 55-60%. Grade 1 diastolic dysfunction.  Antibiotics:  Cefepime 5/28 >>   Vancomycin 5/29 >>  HPI/Subjective: No issues overnight. Feels ok. No n/v/abd pain. Eating ok. No pain.   Objective: Filed Vitals:   01/12/14 0600 01/12/14 0700 01/12/14 0800 01/12/14 0808  BP: 101/62 84/55 123/69   Pulse:      Temp:    97.9 F (36.6 C)  TempSrc:    Oral  Resp: 10 10 11    Height:      Weight:      SpO2: 95%       Intake/Output Summary (Last 24 hours) at 01/12/14 0831 Last data filed at 01/12/14 0800  Gross per 24 hour  Intake   1145 ml  Output   1050 ml  Net     95 ml     Filed Weights   01/08/14 2253 01/10/14 0500 01/11/14 0500  Weight: 81.8 kg (180 lb 5.4 oz) 87.5 kg (192 lb 14.4 oz) 94.5 kg (208 lb 5.4 oz)    Exam:   Remains afebrile, blood pressure better. No hypoxia. Gen. Appears weak, chronically ill, non-toxic Psych. Alert, participates in exam and history Eyes.  Wears glasses, PERRL, lids appear normal ENT. Lips appear normal tongue dry with grey residue Cardiovascular. RRR no m/r/g. No change in 3+ BLE up to pelvis. BUE edema improved  significantly. Telemetry SR. Respiratory. CTA bilaterally no w/r/r. Normal resp effort. Abdomen. Soft ntnd Skin. No rash or induration Musculoskeletal. Moves all extremities Neurologic. Non-focal  Data Reviewed: I/O: -25 and 24 hours. Urine output 1050 Chemistry: Creatinine preserved 1.05. BUN without significant change 62. Normal potassium. Heme: CBC within normal limits ID: Urine culture with CITROBACTER FREUNDII but still pending with another organism. Other: Cortisol random 16.1  Scheduled Meds: . acidophilus  1 capsule Oral Daily  . ceFEPime (MAXIPIME) IV  2 g Intravenous Q24H  . Chlorhexidine Gluconate Cloth  6 each Topical Q0600  . cholecalciferol  1,000 Units Oral Daily  . feeding supplement (PRO-STAT SUGAR FREE 64)  30 mL Oral QID  . feeding supplement (RESOURCE BREEZE)  1 Container Oral TID BM  . ferrous sulfate  325 mg Oral BID WC  . hydrocortisone sod succinate (SOLU-CORTEF) inj  50 mg Intravenous 4 times per day  . levothyroxine  25 mcg Oral QAC breakfast  . mupirocin ointment  1 application Nasal BID  . pantoprazole  40 mg Oral Daily  . rivaroxaban  20 mg Oral Q supper  . sodium chloride  3 mL Intravenous Q12H  . cyanocobalamin  500 mcg Oral Daily  . ascorbic acid  500 mg Oral BID   Continuous Infusions:    Principal Problem:   Sepsis Active Problems:   HYPERTENSION, PULMONARY   DVT   UTI (lower urinary tract infection)   Hypothyroidism   Avascular necrosis of bone of left hip   Failure to thrive   Malnutrition   Diastolic CHF   HTN (hypertension)   High anion gap metabolic acidosis   Acute renal failure   Protein-calorie malnutrition, severe   Time spent 20 minutes

## 2014-01-12 NOTE — Progress Notes (Signed)
PT TRANSFERRING TO ROOM 315 AS MED/SURG PT. PT ALERT AND ORIENTED. VSS. SKIN WARM AND DRY, BILATERAL LT WRIST AND FA NSL'S PATENT. FOLEY CATH PATENT. PT DENIES DISCOMFORT OR DISTRESS. TRANSFER REPORT CALLED TO MARY ANN RN ON 300 FOR JESSIACA MAYES RN.

## 2014-01-12 NOTE — Progress Notes (Signed)
PT Cancellation Note  Patient Details Name: Holly Sloan MRN: [id number] DOB: 1929/02/06   Cancelled Treatment:     Pt order received. We attempted to see this pt on 5/29 and did not due to hypotension. Spoke with pt and family today. Pt had a left hip fracture in 2012 and per the family it is unstable. The family reports the orthopedic has said no WB or activity with her LE. Pt has been bed bound and total care with bed mobility for 3 months per family. Do not feel pt is appropriate for skilled therapy at this time until the orthopedic clears the pt for mobility or ROM/exerciese on LE. Recommend D/C to SNF facility. Pt is d/c from acute PT services.   Rayana Geurin Kerstine Ernestine Conrad 01/12/2014, 1:20 PM

## 2014-01-12 NOTE — Consult Note (Signed)
Referring Provider: Dr. Sarajane Jews Primary Care Physician:  [name], MD Primary Gastroenterologist:  Dr. Gala Romney   Date of Admission: 01/08/14 Date of Consultation: 01/12/14  Reason for Consultation:  Abnormal CT  HPI:  Holly Sloan is a pleasant 80 year old female known to our practice from recent prior admission in March 2015 due to rectal bleeding in the setting of Xarelto, felt to be secondary to large internal hemorrhoids. EGD at that time with normal esophagus, mild erosive gastritis. Appears H.pylori serology was equivocal at 1.06 at that time. Admitted May 28 with ARF, UTI, sepsis. GI has been consulted to comment on recent CT this admission, which show considerable omental portosystemic varices/shunting indicating portal venous hypertension. No recent US of abdomen on file. EGD without stigmata for liver disease. No known prior liver disease.   Upper abdominal, right sided discomfort with eating. Feels full. No hematochezia or melena. No N/V. Ate well for lunch. Denies difficulty swallowing. Diarrhea resolved on admission. GI pathogen panel pending. Significant hypoalbuminemia at 0.9 May 30.    Past Medical History  Diagnosis Date  . CHF (congestive heart failure)   . Hypertension   . Arthritis   . Kidney stone   . Pneumonia     20 years ago  . DVT (deep venous thrombosis) 10/14/13    left leg  . Fracture of wrist 10/18/13    right  . Hip fracture, left   . Deep vein blood clot of left lower extremity   . Hemorrhoids     large per colonoscopy 3/15  . Hiatal hernia     per endo 3/15  . Erosive gastritis     mild per endo 3/15    Past Surgical History  Procedure Laterality Date  . Appendectomy      as teenager  . Kidney stone surgery  jan 2012    stone remopved left side  . Hip pinning,cannulated  05/23/2011    Procedure: CANNULATED HIP PINNING;  Surgeon: Arther Abbott, MD;  Location: [facility];  Service: Orthopedics;  Laterality: Left;  late entry: updated foley  documentation  . Hip open reduction Left 2012  . Esophagogastroduodenoscopy  April 2003    Dr. Gala Romney: 2 polyps proximal stomach, moderate sized hiatal hernia  . Colonoscopy  April 2003    Dr. Gala Romney: left-sided transverse diverticula  . Colonoscopy with esophagogastroduodenoscopy (egd) N/A 10/28/2013    Dr. Oneida Alar: colonoscopy with mild diverticulosis in descending colon and sigmoid colon, large external hemorrhoids as culprit of bleeding, EGD with normal esophagus, large hiatal hernia, mild erosive gastritis.     Prior to Admission medications   Medication Sig Start Date End Date Taking? Authorizing Provider  acidophilus (RISAQUAD) CAPS capsule Take 1 capsule by mouth daily.   Yes Historical Provider, MD  Amino Acids-Protein Hydrolys (FEEDING SUPPLEMENT, PRO-STAT SUGAR FREE 64,) LIQD Take 30 mLs by mouth 4 (four) times daily.   Yes Historical Provider, MD  ascorbic acid (VITAMIN C) 500 MG tablet Take 500 mg by mouth 2 (two) times daily.   Yes Historical Provider, MD  cholecalciferol (VITAMIN D) 1000 UNITS tablet Take 1,000 Units by mouth daily.   Yes Historical Provider, MD  cyanocobalamin 500 MCG tablet Take 500 mcg by mouth daily.   Yes Historical Provider, MD  ferrous sulfate 325 (65 FE) MG tablet Take 325 mg by mouth 2 (two) times daily with a meal.   Yes Historical Provider, MD  furosemide (LASIX) 40 MG tablet Take 40-80 mg by mouth 2 (  two) times daily. 80mg  in the am and 40mg  in the evening take 40mg  twice daily for edema to lower extremities   Yes Historical Provider, MD  gabapentin (NEURONTIN) 300 MG capsule Take 300 mg by mouth 3 (three) times daily.   Yes Historical Provider, MD  levothyroxine (SYNTHROID, LEVOTHROID) 25 MCG tablet Take 25 mcg by mouth every morning.  10/13/13  Yes Historical Provider, MD  mupirocin ointment (BACTROBAN) 2 % Place 1 application into the nose 2 (two) times daily. Starting 01/03/2014-01/08/2014   Yes Historical Provider, MD  pantoprazole (PROTONIX) 40 MG  tablet Take 1 tablet (40 mg total) by mouth 2 (two) times daily before a meal. 10/30/13  Yes Lezlie Octave Black, NP  potassium chloride SA (K-DUR,KLOR-CON) 20 MEQ tablet Take 20 mEq by mouth daily.   Yes Historical Provider, MD  rivaroxaban (XARELTO) 20 MG TABS tablet Take 20 mg by mouth daily with supper.   Yes Historical Provider, MD  spironolactone (ALDACTONE) 25 MG tablet Take 25 mg by mouth daily.   Yes Historical Provider, MD  traMADol (ULTRAM) 50 MG tablet Take 50 mg by mouth every 6 (six) hours as needed for moderate pain.   Yes Historical Provider, MD  amLODipine (NORVASC) 2.5 MG tablet Take 2.5 mg by mouth daily.    Historical Provider, MD  Chlorhexidine Gluconate 2 % PADS Apply topically See admin instructions. Use 6 pads daily for 5 days for MRSA. Avoid eyes, mouth and genital area    Historical Provider, MD  sennosides-docusate sodium (SENOKOT-S) 8.6-50 MG tablet Take 1 tablet by mouth 2 (two) times daily as needed for constipation.     Historical Provider, MD    Current Facility-Administered Medications  Medication Dose Route Frequency Provider Last Rate Last Dose  . acetaminophen (TYLENOL) tablet 650 mg  650 mg Oral Q6H PRN Jonetta Osgood, MD   650 mg at 01/11/14 2214   Or  . acetaminophen (TYLENOL) suppository 650 mg  650 mg Rectal Q6H PRN Jonetta Osgood, MD      . acidophilus (RISAQUAD) capsule 1 capsule  1 capsule Oral Daily Jonetta Osgood, MD   1 capsule at 01/12/14 1054  . albuterol (PROVENTIL) (2.5 MG/3ML) 0.083% nebulizer solution 2.5 mg  2.5 mg Nebulization Q2H PRN Shanker Kristeen Mans, MD      . ceFEPIme (MAXIPIME) 2 g in dextrose 5 % 50 mL IVPB  2 g Intravenous Q24H Jonetta Osgood, MD 100 mL/hr at 01/11/14 2214 2 g at 01/11/14 2214  . Chlorhexidine Gluconate Cloth 2 % PADS 6 each  6 each Topical Q0600 Samuella Cota, MD   6 each at 01/12/14 [phone]  . cholecalciferol (VITAMIN D) tablet 1,000 Units  1,000 Units Oral Daily Jonetta Osgood, MD   1,000 Units at 01/12/14  1051  . feeding supplement (PRO-STAT SUGAR FREE 64) liquid 30 mL  30 mL Oral QID Jonetta Osgood, MD   30 mL at 01/12/14 1357  . feeding supplement (RESOURCE BREEZE) (RESOURCE BREEZE) liquid 1 Container  1 Container Oral TID BM Frederik Schmidt, RD   1 Container at 01/12/14 1357  . ferrous sulfate tablet 325 mg  325 mg Oral BID WC Jonetta Osgood, MD   325 mg at 01/12/14 0811  . guaiFENesin-dextromethorphan (ROBITUSSIN DM) 100-10 MG/5ML syrup 5 mL  5 mL Oral Q4H PRN Shanker Kristeen Mans, MD      . hydrocortisone sodium succinate (SOLU-CORTEF) 100 MG injection 50 mg  50 mg Intravenous 4 times per  day Samuella Cota, MD   50 mg at 01/12/14 1257  . levothyroxine (SYNTHROID, LEVOTHROID) tablet 25 mcg  25 mcg Oral QAC breakfast Jonetta Osgood, MD   25 mcg at 01/12/14 [phone]  . mupirocin ointment (BACTROBAN) 2 % 1 application  1 application Nasal BID Samuella Cota, MD   1 application at 86/76/19 1051  . ondansetron (ZOFRAN) tablet 4 mg  4 mg Oral Q6H PRN Shanker Kristeen Mans, MD       Or  . ondansetron (ZOFRAN) injection 4 mg  4 mg Intravenous Q6H PRN Jonetta Osgood, MD      . pantoprazole (PROTONIX) EC tablet 40 mg  40 mg Oral Daily Lavonia Drafts [place]ill[place], RPH   40 mg at 01/12/14 1054  . rivaroxaban (XARELTO) tablet 20 mg  20 mg Oral Q supper Jonetta Osgood, MD   20 mg at 01/11/14 1800  . sodium chloride 0.9 % injection 3 mL  3 mL Intravenous Q12H Jonetta Osgood, MD   3 mL at 01/12/14 1055  . vitamin B-12 (CYANOCOBALAMIN) tablet 500 mcg  500 mcg Oral Daily Jonetta Osgood, MD   500 mcg at 01/12/14 1051  . vitamin C (ASCORBIC ACID) tablet 500 mg  500 mg Oral BID Jonetta Osgood, MD   500 mg at 01/12/14 1054    Allergies as of 01/08/2014  . (No Known Allergies)    Family History  Problem Relation Age of Onset  . Anesthesia problems Neg Hx   . Hypotension Neg Hx   . Malignant hyperthermia Neg Hx   . Pseudochol deficiency Neg Hx   . Colon cancer Neg Hx   . Cirrhosis Father      ETOH    History   Social History  . Marital Status: Widowed    Spouse Name: N/A    Number of Children: N/A  . Years of Education: N/A   Occupational History  . Not on file.   Social History Main Topics  . Smoking status: Never Smoker   . Smokeless tobacco: Never Used  . Alcohol Use: No  . Drug Use: No  . Sexual Activity: Not Currently   Other Topics Concern  . Not on file   Social History Narrative  . No narrative on file    Review of Systems: Gen: Denies fever, chills, loss of appetite, change in weight or weight loss CV: Denies chest pain, heart palpitations, syncope, edema  Resp: Denies shortness of breath with rest, cough, wheezing GI: see HPI  GU : catheterized MS: +joint pain Derm: Denies rash, itching, dry skin Psych: Denies depression, anxiety,confusion, or memory loss Heme: Denies bruising, bleeding, and enlarged lymph nodes.  Physical Exam: Vital signs in last 24 hours: Temp:  [97.5 F (36.4 C)-97.9 F (36.6 C)] 97.7 F (36.5 C) (06/01 1501) Pulse Rate:  [104] 104 (06/01 1501) Resp:  [10-20] 20 (06/01 1501) BP: (82-130)/(44-85) 130/85 mmHg (06/01 1501) SpO2:  [94 %-98 %] 98 % (06/01 1501) Last BM Date: 01/12/14 General:   Alert,  Pleasant, no apparent distress.  Head:  Normocephalic and atraumatic. Eyes:  Sclera clear, no icterus.   Conjunctiva pink. Ears:  Hard of hearing Nose:  No deformity, discharge,  or lesions. Mouth:  No deformity or lesions  Lungs:  Clear throughout to auscultation.   Heart:  S1 S2 present, no appreciable murmur Abdomen:  Soft, obese, non-tender, non-distended. Full. +BS.  Rectal:  Deferred  Extremities:  3+ pitting lower extremity edema to thigh,  generalized anasarca Neurologic:  Alert and  oriented x4;  grossly normal neurologically. Skin:  Intact without significant lesions or rashes. Psych:  Alert and cooperative. Normal mood and affect.  Intake/Output from previous day: 05/31 0701 - 06/01 0700 In: 1025  [P.O.:600; I.V.:375; IV Piggyback:50] Out: 1050 [Urine:1050] Intake/Output this shift: Total I/O In: 1080 [P.O.:1080] Out: 601 [Urine:600; Stool:1]  Lab Results:  Recent Labs  01/10/14 0538 01/12/14 0428  WBC 9.6 7.6  HGB 11.8* 12.8  HCT 35.9* 37.9  PLT 404* 397   BMET  Recent Labs  01/10/14 0538 01/11/14 0512 01/12/14 0428  NA 137 138 136*  K 4.1 3.5* 4.1  CL 110 106 104  CO2 13* 20 19  GLUCOSE 117* 123* 139*  BUN 65* 64* 62*  CREATININE 1.10 1.09 1.05  CALCIUM 7.5* 7.9* 8.4   LFT  Recent Labs  01/10/14 0538  PROT 2.9*  ALBUMIN 0.9*  AST 16  ALT 12  ALKPHOS 98  BILITOT <0.2*   GI pathogen panel pending  CT abd/pelvis with contrast 01/08/14:  IMPRESSION:  1. Considerable third spacing of fluid with subcutaneous and  mesenteric edema in addition to moderate bilateral pleural effusions  and a small amount of ascites.  2. Considerable omental portosystemic varices/shunting indicating  portal venous hypertension.  3. Numerous scattered diverticula of the small bowel, measuring up  to 6 cm in diameter. I do not identify any of these is being  inflamed. There is also descending and sigmoid colon diverticulosis.  4. Mildly elevated right hemidiaphragm.  5. Reduced size of the calcified left kidney upper pole mass,  arguing against malignancy. The left kidney is severely atrophic and  flattened.  6. Pinning of the left hip, with left hip superior cortical  irregularity and severe left hip osteoarthritis. There is been some  collapse of articular bone and and volume in the left femoral head,  leading to joint exposure of one of the pin tips.  Impression: 80 year old female with multiple medical issues as mentioned above, with interesting findings of omental portosystemic varices/shunting, raising question of portal venous hypertension. Reviewed with Dr. Thornton Papas, who states there are few varices but not extensive, unable to truly pick up on any strong evidence  for occult liver disease on CT. Obvious fatty liver; no stigmata of liver disease on EGD in March 2015. To further sort out, elastography recommended per Dr. Thornton Papas.   As separate issue, H.pylori serology equivocal in March 2015. Recheck now.     Plan: H.pylori serology; treat if positive [place] with elastography on 6/2 Will continue to follow with you.  Orvil Feil, ANP-BC [facility]     LOS: 4 days    01/12/2014, 3:51 PM

## 2014-01-13 DIAGNOSIS — I10 Essential (primary) hypertension: Secondary | ICD-10-CM

## 2014-01-13 LAB — BASIC METABOLIC PANEL
BUN: 56 mg/dL — ABNORMAL HIGH (ref 6–23)
CO2: 18 meq/L — AB (ref 19–32)
CREATININE: 1.05 mg/dL (ref 0.50–1.10)
Calcium: 8.8 mg/dL (ref 8.4–10.5)
Chloride: 104 mEq/L (ref 96–112)
GFR calc non Af Amer: 48 mL/min — ABNORMAL LOW (ref >90)
GFR, EST AFRICAN AMERICAN: 56 mL/min — AB (ref >90)
Glucose, Bld: 111 mg/dL — ABNORMAL HIGH (ref 70–99)
POTASSIUM: 3.9 meq/L (ref 3.7–5.3)
Sodium: 135 mEq/L — ABNORMAL LOW (ref 137–147)

## 2014-01-13 LAB — H. PYLORI ANTIBODY, IGG: H PYLORI IGG: 1.52 {ISR} — AB

## 2014-01-13 LAB — URINE CULTURE: Colony Count: >=100000

## 2014-01-13 MED ORDER — HYDROCORTISONE NA SUCCINATE PF 100 MG IJ SOLR
50.0000 mg | Freq: Three times a day (TID) | INTRAMUSCULAR | Status: DC
  Administered 2014-01-13 – 2014-01-15 (×5): 50 mg via INTRAVENOUS
  Filled 2014-01-13 (×5): qty 2

## 2014-01-13 MED ORDER — PANTOPRAZOLE SODIUM 40 MG PO TBEC
40.0000 mg | DELAYED_RELEASE_TABLET | Freq: Two times a day (BID) | ORAL | Status: DC
  Administered 2014-01-13 – 2014-01-20 (×14): 40 mg via ORAL
  Filled 2014-01-13 (×14): qty 1

## 2014-01-13 MED ORDER — AMOXICILLIN 250 MG PO CAPS
1000.0000 mg | ORAL_CAPSULE | Freq: Two times a day (BID) | ORAL | Status: DC
  Administered 2014-01-13 – 2014-01-16 (×6): 1000 mg via ORAL
  Filled 2014-01-13 (×6): qty 4

## 2014-01-13 MED ORDER — CEFUROXIME AXETIL 250 MG PO TABS
500.0000 mg | ORAL_TABLET | Freq: Two times a day (BID) | ORAL | Status: DC
  Administered 2014-01-13 – 2014-01-15 (×5): 500 mg via ORAL
  Filled 2014-01-13 (×5): qty 2

## 2014-01-13 MED ORDER — CLARITHROMYCIN 500 MG PO TABS
500.0000 mg | ORAL_TABLET | Freq: Two times a day (BID) | ORAL | Status: DC
  Administered 2014-01-13 – 2014-01-16 (×6): 500 mg via ORAL
  Filled 2014-01-13 (×6): qty 1

## 2014-01-13 NOTE — Progress Notes (Signed)
Patient had a moderate sized bloody stool. VSS. BP is 111/73. Contacted midlevel provider. Midlevel to call back after reviewing the chart. Will continue to monitor closely.

## 2014-01-13 NOTE — Progress Notes (Signed)
PROGRESS NOTE  Holly Sloan IWL:[id number] DOB: December 06, 1928 DOA: 01/08/2014 PCP: [name], MD  Summary: 80 year old woman resident of [facility] facility underwent IVC filter placement 5/21, since that time has had numerous episodes of nausea, vomiting and diarrhea. In the emergency department found to have acute renal failure, UTI, hypotension suggesting sepsis. Patient has declined steadily over the last few months and is essentially non-ambulatory because of left hip pain. Status post IVC filter placement on 01/01/2013  Assessment/Plan: 1. Suspected sepsis with hypotension, lactic acidosis, AG metabolic acidosis on admission, secondary to UTI. No leukocytosis, remains afebrile. Appears stable at this point. Change IV Cefepime to oral ceftin. 2. Hypotension, persistant. Improved with empiric solu-cortef though cortisol was normal. Taper stress dose steriods. 3. UTI CITROBACTER FREUNDII and Klebsiella pneumonaie. On IV Cefepime. Change tp oral ceftin to complete course of antibiotics. 4. Acute renal failure. Likely secondary to vomiting, diarrhea, sepsis, diuretics. Creatinine within normal limits. BUN slowly trending downwards. 5. Anasarca secondary to longstanding hypoalbuminemia from chronic disease, severe. Evaluated by oncology in the past.  6. AG-metabolic acidosis suspect secondary to lactic acidosis secondary to sepsis. Resolved. 7. Nausea, vomiting, intermittent diarrhea for 5-6 days with intermittent lower abdominal pain. None since admission. No acute findings on CT to explain. 8. Considerable omental portosystemic varices/shunting indicating portal venous hypertension. Etiology unclear. GI consulted. 9. Failure to thrive. Fair intake during this hospitalization. 10. Chronic diastolic congestive heart failure. Stable. 11. History of DVT x2, most recently 10/2013, status post IVC filter placement 5/21, on Xarelto. 12. History of rectal bleeding 10/2013 no evidence of  recurrence 13. History of left hip fracture 1740, complicated by avascular necrosis, total hip replacement being planned. 14. Dysphagia. Continue dysphagia 3 diet. 15. Severe malnutrition in context of chronic illness   Clinically stable with improved BP. Plan change empiric cefepime to oral ceftin.  Continue gentle diuresis, Taper empiric Solu-cortef.  GI ff  BMP in the morning  Dr. Sarajane Jews discussed in detail yesterday with daughter at bedside. Anasarca is longstanding and will be slow to improve, IF nutrition status and albumin improve.   Code Status: full code DVT prophylaxis: Xarelto Family Communication:  Disposition Plan: likely return to [facility]  [name], MD  [facility]  Pager [phone] If 7PM-7AM, please contact night-coverage at [email or web address], password [facility] 01/13/2014, 11:30 AM  LOS: 5 days   Consultants:    Procedures:  2-D echocardiogram: Left ventricular ejection fraction 55-60%. Grade 1 diastolic dysfunction.  Abdominal US pending  Antibiotics:  Cefepime 5/28 >>   Vancomycin 5/29 >>01/11/14  HPI/Subjective: No issues overnight. Feels ok. No n/v/abd pain. No pain. Patient anxious to when [place] to be done.  Objective: Filed Vitals:   01/12/14 1300 01/12/14 1501 01/12/14 2122 01/13/14 0710  BP: 109/71 130/85 114/74 117/77  Pulse:  104 92 89  Temp:  97.7 F (36.5 C) 97.4 F (36.3 C) 97.5 F (36.4 C)  TempSrc:  Oral Oral Oral  Resp: 18 20 20 20   Height:      Weight:    95.3 kg (210 lb 1.6 oz)  SpO2:  98% 96% 96%    Intake/Output Summary (Last 24 hours) at 01/13/14 1130 Last data filed at 01/13/14 0700  Gross per 24 hour  Intake    600 ml  Output   1600 ml  Net  -1000 ml     Filed Weights   01/10/14 0500 01/11/14 0500 01/13/14 0710  Weight: 87.5 kg (192 lb 14.4 oz) 94.5 kg (208 lb 5.4 oz)  95.3 kg (210 lb 1.6 oz)    Exam:   Remains afebrile, blood pressure better. No hypoxia. Gen. Appears weak, chronically ill,  non-toxic Psych. Alert, participates in exam and history Eyes. Wears glasses, PERRL, lids appear normal ENT. Lips appear normal tongue dry with grey residue Cardiovascular. RRR no m/r/g. No change in 3+ BLE up to pelvis. BUE edema improved significantly. Telemetry SR. Respiratory. CTA bilaterally no w/r/r. Normal resp effort. Abdomen. Soft ntnd Skin. No rash or induration Musculoskeletal. Moves all extremities Neurologic. Non-focal  Data Reviewed: I/O: -401 and 24 hours. Urine output 1600 Chemistry: Creatinine preserved 1.05. BUN without significant change 56. Normal potassium. Heme: CBC within normal limits ID: Urine culture with CITROBACTER FREUNDII/Klebsiella pneum Other: Cortisol random 16.1  Scheduled Meds: . acidophilus  1 capsule Oral Daily  . ceFEPime (MAXIPIME) IV  2 g Intravenous Q24H  . cholecalciferol  1,000 Units Oral Daily  . feeding supplement (PRO-STAT SUGAR FREE 64)  30 mL Oral QID  . feeding supplement (RESOURCE BREEZE)  1 Container Oral TID BM  . ferrous sulfate  325 mg Oral BID WC  . hydrocortisone sod succinate (SOLU-CORTEF) inj  50 mg Intravenous 4 times per day  . levothyroxine  25 mcg Oral QAC breakfast  . mupirocin ointment  1 application Nasal BID  . pantoprazole  40 mg Oral Daily  . rivaroxaban  20 mg Oral Q supper  . sodium chloride  3 mL Intravenous Q12H  . cyanocobalamin  500 mcg Oral Daily  . ascorbic acid  500 mg Oral BID   Continuous Infusions:    Principal Problem:   Sepsis Active Problems:   HYPERTENSION, PULMONARY   DVT   UTI (lower urinary tract infection)   Hypothyroidism   Avascular necrosis of bone of left hip   Failure to thrive   Malnutrition   Diastolic CHF   HTN (hypertension)   High anion gap metabolic acidosis   Acute renal failure   Protein-calorie malnutrition, severe   Time spent 30 minutes

## 2014-01-13 NOTE — Progress Notes (Signed)
    Subjective: Denies abdominal pain, N/V. NPO for elastography today.   Objective: Vital signs in last 24 hours: Temp:  [97.4 F (36.3 C)-97.7 F (36.5 C)] 97.5 F (36.4 C) (06/02 0710) Pulse Rate:  [89-104] 89 (06/02 0710) Resp:  [18-20] 20 (06/02 0710) BP: (109-130)/(71-85) 117/77 mmHg (06/02 0710) SpO2:  [96 %-98 %] 96 % (06/02 0710) Weight:  [210 lb 1.6 oz (95.3 kg)] 210 lb 1.6 oz (95.3 kg) (06/02 0710) Last BM Date: 01/12/14 General:   Alert and oriented, pleasant Abdomen:  Bowel sounds present, soft, non-tender, non-distended.  Extremities:  2-3+ lower extremity pitting edema Neurologic:  Alert and  oriented x4;  grossly normal neurologically. Psych:  Alert and cooperative. Normal mood and affect.  Intake/Output from previous day: 06/01 0701 - 06/02 0700 In: 1200 [P.O.:1200] Out: 1601 [Urine:1600; Stool:1] Intake/Output this shift:    Lab Results:  Recent Labs  01/12/14 0428  WBC 7.6  HGB 12.8  HCT 37.9  PLT 397   BMET  Recent Labs  01/11/14 0512 01/12/14 0428 01/13/14 0539  NA 138 136* 135*  K 3.5* 4.1 3.9  CL 106 104 104  CO2 20 19 18*  GLUCOSE 123* 139* 111*  BUN 64* 62* 56*  CREATININE 1.09 1.05 1.05  CALCIUM 7.9* 8.4 8.8   Assessment: 80 year old female with multiple medical issues and interesting findings of omental portosystemic varices/shunting, raising question of portal venous hypertension. Reviewed with Dr. Thornton Papas yesterday, who states there are few varices but not extensive, unable to truly pick up on any strong evidence for occult liver disease on CT. Obvious fatty liver; no stigmata of liver disease on EGD in March 2015. To further sort out, elastography recommended per Dr. Thornton Papas.   As separate issue, H.pylori serology equivocal in March 2015. Rechecked and positive. Needs treatment. Will review with pharmacy due to polypharmacy, Xarelto.   Plan: H.pylori treatment to discuss with pharmacy [place] with elastography today Will continue to  follow with you.  Orvil Feil, ANP-BC [facility]    LOS: 5 days    01/13/2014, 11:47 AM    Addendum: Treat with generic Prevpac. Amoxicillin 1000 mg po BID, Biaxin 500 mg po BID, Protonix BID X 10 days.  Orvil Feil, ANP-BC [facility]

## 2014-01-14 DIAGNOSIS — A048 Other specified bacterial intestinal infections: Secondary | ICD-10-CM

## 2014-01-14 LAB — BASIC METABOLIC PANEL
BUN: 58 mg/dL — AB (ref 6–23)
CHLORIDE: 104 meq/L (ref 96–112)
CO2: 20 mEq/L (ref 19–32)
Calcium: 9 mg/dL (ref 8.4–10.5)
Creatinine, Ser: 1.03 mg/dL (ref 0.50–1.10)
GFR calc Af Amer: 57 mL/min — ABNORMAL LOW (ref >90)
GFR calc non Af Amer: 49 mL/min — ABNORMAL LOW (ref >90)
Glucose, Bld: 98 mg/dL (ref 70–99)
Potassium: 4.1 mEq/L (ref 3.7–5.3)
Sodium: 135 mEq/L — ABNORMAL LOW (ref 137–147)

## 2014-01-14 LAB — HEMOGLOBIN AND HEMATOCRIT, BLOOD
HCT: 34.1 % — ABNORMAL LOW (ref 36.0–46.0)
HEMATOCRIT: 34.2 % — AB (ref 36.0–46.0)
HEMOGLOBIN: 11.7 g/dL — AB (ref 12.0–15.0)
Hemoglobin: 11.5 g/dL — ABNORMAL LOW (ref 12.0–15.0)

## 2014-01-14 LAB — CBC
HEMATOCRIT: 33.1 % — AB (ref 36.0–46.0)
HEMOGLOBIN: 11.5 g/dL — AB (ref 12.0–15.0)
MCH: 30.6 pg (ref 26.0–34.0)
MCHC: 34.7 g/dL (ref 30.0–36.0)
MCV: 88 fL (ref 78.0–100.0)
Platelets: 416 10*3/uL — ABNORMAL HIGH (ref 150–400)
RBC: 3.76 MIL/uL — AB (ref 3.87–5.11)
RDW: 16.5 % — AB (ref 11.5–15.5)
WBC: 13.1 10*3/uL — ABNORMAL HIGH (ref 4.0–10.5)

## 2014-01-14 MED ORDER — SIMETHICONE 80 MG PO CHEW
160.0000 mg | CHEWABLE_TABLET | Freq: Four times a day (QID) | ORAL | Status: DC | PRN
  Administered 2014-01-14 – 2014-01-15 (×2): 160 mg via ORAL
  Filled 2014-01-14 (×2): qty 2

## 2014-01-14 NOTE — Progress Notes (Addendum)
Subjective: Denies abdominal pain, N/V. Moderate sized bloody stool yesterday evening. This morning another moderate sized burgundy appearing stool.   Objective: Vital signs in last 24 hours: Temp:  [97.7 F (36.5 C)-98.2 F (36.8 C)] 98.2 F (36.8 C) (06/03 0659) Pulse Rate:  [68-101] 88 (06/03 0659) Resp:  [20] 20 (06/03 0659) BP: (111-126)/(73-78) 122/78 mmHg (06/03 0659) SpO2:  [95 %-97 %] 96 % (06/03 0659) Weight:  [208 lb 1.8 oz (94.4 kg)] 208 lb 1.8 oz (94.4 kg) (06/03 0500) Last BM Date: 01/13/14 General:   Alert and oriented, pleasant Abdomen:  Bowel sounds present, soft, non-tender, non-distended.  Rectal: external hemorrhoids, non-thrombosed. Internal exam without mass, stricture, lesions, dark burgundy blood on exam.  Extremities:  2+ lower extremity edema Neurologic:  Alert and  oriented x4;  grossly normal neurologically. Psych:  Alert and cooperative. Normal mood and affect.  Intake/Output from previous day: 06/02 0701 - 06/03 0700 In: 240 [P.O.:240] Out: 1501 [Urine:1500; Stool:1] Intake/Output this shift:    Lab Results:  Recent Labs  01/12/14 0428 01/14/14 0541  WBC 7.6 13.1*  HGB 12.8 11.5*  HCT 37.9 33.1*  PLT 397 416*   BMET  Recent Labs  01/12/14 0428 01/13/14 0539 01/14/14 0541  NA 136* 135* 135*  K 4.1 3.9 4.1  CL 104 104 104  CO2 19 18* 20  GLUCOSE 139* 111* 98  BUN 62* 56* 58*  CREATININE 1.05 1.05 1.03  CALCIUM 8.4 8.8 9.0     Studies/Results: US Abdomen Complete W/elastography  01/13/2014   CLINICAL DATA:  Portosystemic varices on recent CT. Evaluate for cul liver disease.  EXAM: ULTRASOUND ABDOMEN  ULTRASOUND HEPATIC ELASTOGRAPHY  TECHNIQUE: Sonography of the upper abdomen was perform, in addition, ultrasound elastography evaluation of the liver was performed. A region of interest was placed within the right lobe of the liver. Following application of a compressive sonographic pulse, shear waves were detected in the  adjacent hepatic tissue and the shear wave velocity was calculated. Multiple assessments were performed at the selected site. Median shear wave velocity is correlated to a Metavir fibrosis score.  COMPARISON:  None.  CORRELATIVE IMAGING: CT 01/08/2014  FINDINGS: ULTRASOUND ABDOMEN  Gallbladder:  Limited visualization due to overlying bowel gas. There appears to be some sludge within the gallbladder. Wall appears mildly thickened at 5-6 mm. Negative sonographic Murphy's.  Common bile duct:  Diameter: Normal caliber, 4 mm  Liver:  Heterogeneous echotexture. Slightly nodular contours of the liver suggest cirrhosis. No focal abnormality.  IVC:  Not visualized due to body habitus and overlying bowel gas.  Pancreas:  Not visualized due to body habitus and overlying bowel gas.  Spleen:  Not visualized due to body habitus and overlying bowel gas.  Right Kidney:  Length: Limited visualization, 8.9 cm.  No hydronephrosis.  Left Kidney:  Length: Not visualized due to body habitus and overlying bowel gas.  Abdominal aorta:  Not visualized due to body habitus and overlying bowel gas  Other findings:  Right pleural effusion incidentally noted.  ULTRASOUND HEPATIC ELASTOGRAPHY  Hepatic Segment:  8  Number of measurements:  13  Median velocity:   0.91  m/sec  Corresponding Metavir fibrosis score:  F 0  Pertinent liver disease findings noted on other imaging exams: None noted or none available  Please note that abnormal shear wave velocities may also be identified in clinical settings other than with hepatic fibrosis, such as: Acute hepatitis, elevated right heart and central venous pressures, veno-occlusive disease (Budd-Chiari), infiltrative  processes such as mastocytosis/amyloidosis/infiltrative tumor, extrahepatic cholestasis, in the post-prandial state, and liver transplantation. Correlation with patient history, laboratory data, and clinical condition recommended.  IMPRESSION: ULTRASOUND ABDOMEN: Very limited study due to body  habitus and overlying bowel gas. The liver appears heterogeneous and contours appear mildly nodular suggesting the possibility of cirrhosis.  Limited visualization the gallbladder appears to demonstrates sludge and wall thickening. Negative sonographic Murphy's.  ULTRASOUND HEPATIC ELASTOGRAHY: Median hepatic shear wave velocity is calculated at 0.91 m/sec, which corresponds to a Metavir fibrosis score of F 0.   Electronically Signed   By: Rolm Baptise M.D.   On: 01/13/2014 12:34    Assessment: 80 year old female admitted with acute renal failure, UTI, sepsis. CT reviewed considerable omental portosystemic varices/shunting with question of portal venous hypertension. Elastography completed with no fibrosis apparent. Unclear the significance of CT findings.   As new issue: patient now with 2 moderate sized burgundy stools since yesterday evening. Hgb down a gram. Complicated by Xarelto, which she takes each evening due to history of DVT. Last dose 6/2 in the evening. She had rectal bleeding in March 2015, with inpatient procedures to include colonoscopy and upper endoscopy revealing large internal hemorrhoids, moderate diverticulosis in descending and sigmoid colon, and EGD showed mild erosive gastritis, moderate diverticulosis in duodenum. H.pylori serology strongly positive, with generic Prevpac treatment started yesterday. Repeat Hgb every 6 hours. To discuss further with Dr. Gala Romney. As of note, Xarelto may need to be held if continued bleeding as repeat procedure may be required if hemodynamically significant.   Plan: Repeat H/H at noon and then every 6 hours Continue PPI BID Monitor for further evidence of overt bleeding ON XARELTO: last dose 6/2 in the evening. Further recommendations regarding dosing to follow.  H.pylori treatment for total of 14 days   Orvil Feil, ANP-BC [facility]    LOS: 6 days    01/14/2014, 9:27 AM    Attending note:  Patient seen this evening.  Discussed with patient and daughter. Agree with holding Xarelto; will reassess tomorrow morning.

## 2014-01-14 NOTE — Clinical Social Work Note (Signed)
CSW updated daughter, [name], in room re discharge planning.  Sister Fraser Din had voiced concerns about med administration and lifting procedures at [facility], these concerns were relayed to [place] at [facility].  Family had requested CSW investigate possibility of patient transfer to [facility] has no beds.  Family updated, and will update patient when appropriate.  Edwyna Shell, LCSW Clinical Social Worker [phone])

## 2014-01-14 NOTE — Progress Notes (Signed)
PROGRESS NOTE  Holly Sloan ZOX:[id number] DOB: [birth year] DOA: 01/08/2014 PCP: [name], MD  Summary: 80 year old woman resident of [facility] facility underwent IVC filter placement 5/21, since that time has had numerous episodes of nausea, vomiting and diarrhea. In the emergency department found to have acute renal failure, UTI, hypotension suggesting sepsis. Patient has declined steadily over the last few months and is essentially non-ambulatory because of left hip pain. Status post IVC filter placement on 01/01/2013  Assessment/Plan: 1. Suspected sepsis with hypotension, lactic acidosis, AG metabolic acidosis on admission, secondary to UTI. No leukocytosis, remains afebrile. Appears stable at this point. Continue oral ceftin. 2. Hypotension, persistant. Improved with empiric solu-cortef though cortisol was normal. Taper stress dose steriods. 3. UTI CITROBACTER FREUNDII and Klebsiella pneumonaie. Continue oral ceftin to complete course of antibiotics. 4. Acute renal failure. Likely secondary to vomiting, diarrhea, sepsis, diuretics. Creatinine within normal limits. BUN slowly trending downwards. 5. Anasarca secondary to longstanding hypoalbuminemia from chronic disease, severe. Evaluated by oncology in the past.  6. AG-metabolic acidosis suspect secondary to lactic acidosis secondary to sepsis. Resolved. 7. Nausea, vomiting, intermittent diarrhea for 5-6 days with intermittent lower abdominal pain. None since admission. No acute findings on CT to explain. 8. Considerable omental portosystemic varices/shunting indicating portal venous hypertension. Etiology unclear. GI ff 9. Failure to thrive. Fair intake during this hospitalization. 10. Chronic diastolic congestive heart failure. Stable. 11. History of DVT x2, most recently 10/2013, status post IVC filter placement 5/21, on Xarelto. 12. History of rectal bleeding 10/2013. GI bleed overnight and this morning. Patient with elevated BUN.  H&H is stable. Patient still on xarelto will defer to GI as to whether to hold. 74. History of left hip fracture 8119, complicated by avascular necrosis, total hip replacement being planned. 14. Dysphagia. Continue dysphagia 3 diet. 15. Severe malnutrition in context of chronic illness 16. H. pylori infection   Clinically stable with improved BP. Continue oral ceftin.  Continue gentle diuresis, Taper empiric Solu-cortef.  Serial CBCs  GI ff  BMP and CBC in the morning  Discussed in detail with daughters at bedside. Anasarca is longstanding and will be slow to improve, IF nutrition status and albumin improve.   defer to GI as to whether to hold xarelto light of GI bleed.  Patient has been started on treatment for H. pylori per GI.  Code Status: full code DVT prophylaxis: Xarelto Family Communication:  Disposition Plan: likely return to [facility]  [name], MD  [facility]  Pager [phone] If 7PM-7AM, please contact night-coverage at [email or web address], password [facility] 01/14/2014, 3:35 PM  LOS: 6 days   Consultants:  Gastroenterology: Dr Gala Romney 01/12/2014  Procedures:  2-D echocardiogram: Left ventricular ejection fraction 55-60%. Grade 1 diastolic dysfunction.  Abdominal US 01/13/2014  Antibiotics:  Cefepime 5/28 >>01/13/14   Vancomycin 5/29 >>01/11/14  Oral Ceftin 01/13/2014  Amoxicillin 01/13/2014  HPI/Subjective: Patient with bloody stool over night and 1 this morning.  No n/v/abd pain. No pain.   Objective: Filed Vitals:   01/13/14 2005 01/14/14 0500 01/14/14 0659 01/14/14 1417  BP: 117/75  122/78 125/81  Pulse: 94  88 104  Temp: 98.1 F (36.7 C)  98.2 F (36.8 C) 97.3 F (36.3 C)  TempSrc: Axillary  Oral Oral  Resp: 20  20 20   Height:      Weight:  94.4 kg (208 lb 1.8 oz)    SpO2: 95%  96% 96%    Intake/Output Summary (Last 24 hours) at 01/14/14 1535 Last data filed at 01/14/14  1417  Gross per 24 hour  Intake    240 ml  Output   2501 ml    Net  -2261 ml     Filed Weights   01/11/14 0500 01/13/14 0710 01/14/14 0500  Weight: 94.5 kg (208 lb 5.4 oz) 95.3 kg (210 lb 1.6 oz) 94.4 kg (208 lb 1.8 oz)    Exam:   Remains afebrile, blood pressure stable. No hypoxia. Gen. Appears weak, chronically ill, non-toxic Psych. Alert, participates in exam and history Eyes. Wears glasses, PERRL, lids appear normal ENT. Lips appear normal tongue dry with grey residue Cardiovascular. RRR no m/r/g. No change in 3+ BLE up to pelvis. BUE edema improved significantly. Telemetry SR. Respiratory. CTA bilaterally no w/r/r. Normal resp effort. Abdomen. Soft ntnd Skin. No rash or induration Musculoskeletal. Moves all extremities Neurologic. Non-focal  Data Reviewed: I/O: -401 and 24 hours. Urine output 1600 Chemistry: Creatinine preserved 1.05. BUN without significant change 56. Normal potassium. Heme: CBC within normal limits ID: Urine culture with CITROBACTER FREUNDII/Klebsiella pneum Other: Cortisol random 16.1  Scheduled Meds: . acidophilus  1 capsule Oral Daily  . amoxicillin  1,000 mg Oral Q12H  . cefUROXime  500 mg Oral BID WC  . cholecalciferol  1,000 Units Oral Daily  . clarithromycin  500 mg Oral Q12H  . feeding supplement (PRO-STAT SUGAR FREE 64)  30 mL Oral QID  . feeding supplement (RESOURCE BREEZE)  1 Container Oral TID BM  . ferrous sulfate  325 mg Oral BID WC  . hydrocortisone sod succinate (SOLU-CORTEF) inj  50 mg Intravenous Q8H  . levothyroxine  25 mcg Oral QAC breakfast  . pantoprazole  40 mg Oral BID AC  . rivaroxaban  20 mg Oral Q supper  . sodium chloride  3 mL Intravenous Q12H  . cyanocobalamin  500 mcg Oral Daily  . ascorbic acid  500 mg Oral BID   Continuous Infusions:    Principal Problem:   Sepsis Active Problems:   HYPERTENSION, PULMONARY   DVT   UTI (lower urinary tract infection)   Hypothyroidism   GIB (gastrointestinal bleeding)   Avascular necrosis of bone of left hip   Failure to thrive    Malnutrition   Diastolic CHF   HTN (hypertension)   High anion gap metabolic acidosis   Acute renal failure   Protein-calorie malnutrition, severe   H. pylori infection   Time spent 30 minutes

## 2014-01-15 ENCOUNTER — Inpatient Hospital Stay (HOSPITAL_COMMUNITY): Payer: Medicare HMO

## 2014-01-15 ENCOUNTER — Encounter (HOSPITAL_COMMUNITY): Payer: Self-pay | Admitting: Gastroenterology

## 2014-01-15 DIAGNOSIS — D649 Anemia, unspecified: Secondary | ICD-10-CM

## 2014-01-15 LAB — BASIC METABOLIC PANEL
BUN: 57 mg/dL — ABNORMAL HIGH (ref 6–23)
CHLORIDE: 107 meq/L (ref 96–112)
CO2: 18 meq/L — AB (ref 19–32)
CREATININE: 0.92 mg/dL (ref 0.50–1.10)
Calcium: 9.3 mg/dL (ref 8.4–10.5)
GFR calc non Af Amer: 56 mL/min — ABNORMAL LOW (ref >90)
GFR, EST AFRICAN AMERICAN: 65 mL/min — AB (ref >90)
Glucose, Bld: 107 mg/dL — ABNORMAL HIGH (ref 70–99)
Potassium: 3.8 mEq/L (ref 3.7–5.3)
SODIUM: 138 meq/L (ref 137–147)

## 2014-01-15 LAB — CBC
HCT: 29.9 % — ABNORMAL LOW (ref 36.0–46.0)
HEMOGLOBIN: 10.2 g/dL — AB (ref 12.0–15.0)
MCH: 30.5 pg (ref 26.0–34.0)
MCHC: 34.1 g/dL (ref 30.0–36.0)
MCV: 89.5 fL (ref 78.0–100.0)
PLATELETS: 368 10*3/uL (ref 150–400)
RBC: 3.34 MIL/uL — ABNORMAL LOW (ref 3.87–5.11)
RDW: 16.4 % — ABNORMAL HIGH (ref 11.5–15.5)
WBC: 16.5 10*3/uL — ABNORMAL HIGH (ref 4.0–10.5)

## 2014-01-15 LAB — HEMOGLOBIN AND HEMATOCRIT, BLOOD
HEMATOCRIT: 29.8 % — AB (ref 36.0–46.0)
HEMATOCRIT: 29.8 % — AB (ref 36.0–46.0)
HEMOGLOBIN: 10.2 g/dL — AB (ref 12.0–15.0)
HEMOGLOBIN: 9.7 g/dL — AB (ref 12.0–15.0)

## 2014-01-15 LAB — TYPE AND SCREEN
ABO/RH(D): A NEG
Antibody Screen: NEGATIVE

## 2014-01-15 MED ORDER — HYDROCORTISONE NA SUCCINATE PF 100 MG IJ SOLR
50.0000 mg | Freq: Two times a day (BID) | INTRAMUSCULAR | Status: DC
  Administered 2014-01-15 – 2014-01-16 (×2): 50 mg via INTRAVENOUS
  Filled 2014-01-15 (×4): qty 2

## 2014-01-15 MED ORDER — OXYCODONE-ACETAMINOPHEN 5-325 MG PO TABS
1.0000 | ORAL_TABLET | ORAL | Status: DC | PRN
  Administered 2014-01-15 (×2): 1 via ORAL
  Administered 2014-01-16: 2 via ORAL
  Administered 2014-01-17 – 2014-01-20 (×5): 1 via ORAL
  Filled 2014-01-15 (×2): qty 1
  Filled 2014-01-15: qty 2
  Filled 2014-01-15 (×6): qty 1

## 2014-01-15 NOTE — Progress Notes (Signed)
PROGRESS NOTE  Holly Sloan YQI:[id number] DOB: 07/15/1929 DOA: 01/08/2014 PCP: [name], MD  Summary: 80 year old woman resident of [facility] facility underwent IVC filter placement 5/21, since that time has had numerous episodes of nausea, vomiting and diarrhea. In the emergency department found to have acute renal failure, UTI, hypotension suggesting sepsis. Patient has declined steadily over the last few months and is essentially non-ambulatory because of left hip pain. Status post IVC filter placement on 01/01/2013  Assessment/Plan: 1. Suspected sepsis with hypotension, lactic acidosis, AG metabolic acidosis on admission, secondary to UTI. No leukocytosis, remains afebrile. Appears stable at this point. Continue oral ceftin. 2. Hypotension, persistant. Improved with empiric solu-cortef though cortisol was normal. Taper stress dose steriods. 3. UTI CITROBACTER FREUNDII and Klebsiella pneumonaie. Continue oral ceftin D7/7 to complete course of antibiotics. 4. Acute renal failure. Likely secondary to vomiting, diarrhea, sepsis, diuretics. Creatinine within normal limits. BUN slowly trending downwards. 5. Anasarca secondary to longstanding hypoalbuminemia from chronic disease, severe. Evaluated by oncology in the past.  6. AG-metabolic acidosis suspect secondary to lactic acidosis secondary to sepsis. Resolved. 7. Nausea, vomiting, intermittent diarrhea for 5-6 days with intermittent lower abdominal pain. None since admission. No acute findings on CT to explain. 8. Considerable omental portosystemic varices/shunting indicating portal venous hypertension. Etiology unclear. GI ff 9. Failure to thrive. Fair intake during this hospitalization. 10. Chronic diastolic congestive heart failure. Stable. 11. History of DVT x2, most recently 10/2013, status post IVC filter placement 5/21, on Xarelto. 12. History of rectal bleeding 10/2013. GI bleed overnight and this morning. Patient with elevated  BUN. H&H is stable. Patient still on xarelto will defer to GI as to whether to hold. 36. History of left hip fracture 3875, complicated by avascular necrosis, total hip replacement being planned. 14. Dysphagia. Continue dysphagia 3 diet. 15. Severe malnutrition in context of chronic illness 16. H. pylori infection 17. Leukocytosis: Likely steriod induced. Patient to finish treatment for UTI today. Follow.   Clinically stable with improved BP. Continue oral ceftin.  Continue gentle diuresis, Taper empiric Solu-cortef.  Serial CBCs. D/c Xarelto.   GI ff  BMP and CBC in the morning  Discussed in detail with daughters at bedside. Anasarca is longstanding and will be slow to improve, IF nutrition status and albumin improve.   defer to GI as to whether to hold xarelto light of GI bleed.  Patient has been started on treatment for H. pylori per GI.  Code Status: full code DVT prophylaxis: scd Family Communication:  Disposition Plan: likely return to [facility]  Holly Seal, MD  [facility]  Pager [phone] If 7PM-7AM, please contact night-coverage at [email or web address], password [facility] 01/15/2014, 11:08 AM  LOS: 7 days   Consultants:  Gastroenterology: Dr Gala Romney 01/12/2014  Procedures:  2-D echocardiogram: Left ventricular ejection fraction 55-60%. Grade 1 diastolic dysfunction.  Abdominal US 01/13/2014  Antibiotics:  Cefepime 5/28 >>01/13/14   Vancomycin 5/29 >>01/11/14  Oral Ceftin 01/13/2014>>>>01/15/14  Amoxicillin 01/13/2014  HPI/Subjective: Patient with bloody stool this morning.  No n/v/abd pain. No pain. Patient stated did not want any more procedures.  Objective: Filed Vitals:   01/14/14 0659 01/14/14 1417 01/14/14 2122 01/15/14 0534  BP: 122/78 125/81 126/63 102/63  Pulse: 88 104 85 95  Temp: 98.2 F (36.8 C) 97.3 F (36.3 C) 97.6 F (36.4 C) 97.5 F (36.4 C)  TempSrc: Oral Oral Oral Oral  Resp: 20 20 24 20   Height:      Weight:    97.5 kg (214 lb 15.2  oz)  SpO2: 96% 96% 98% 97%    Intake/Output Summary (Last 24 hours) at 01/15/14 1108 Last data filed at 01/15/14 1102  Gross per 24 hour  Intake    480 ml  Output   1104 ml  Net   -624 ml     Filed Weights   01/13/14 0710 01/14/14 0500 01/15/14 0534  Weight: 95.3 kg (210 lb 1.6 oz) 94.4 kg (208 lb 1.8 oz) 97.5 kg (214 lb 15.2 oz)    Exam:   Remains afebrile, blood pressure stable. No hypoxia. Gen. Appears weak, chronically ill, non-toxic Psych. Alert, participates in exam and history Eyes. Wears glasses, PERRL, lids appear normal ENT. Lips appear normal tongue dry with grey residue Cardiovascular. RRR no m/r/g. No change in 3+ BLE up to pelvis. BUE edema improved significantly. Telemetry SR. Respiratory. CTA bilaterally no w/r/r. Normal resp effort. Abdomen. Soft ntnd Skin. No rash or induration Musculoskeletal. Moves all extremities Neurologic. Non-focal  Data Reviewed: I/O: -401 and 24 hours. Urine output 1900 Chemistry: Creatinine preserved 1.05. BUN without significant change 56. Normal potassium. Heme: CBC within normal limits ID: Urine culture with CITROBACTER FREUNDII/Klebsiella pneum Other: Cortisol random 16.1  Scheduled Meds: . acidophilus  1 capsule Oral Daily  . amoxicillin  1,000 mg Oral Q12H  . cefUROXime  500 mg Oral BID WC  . cholecalciferol  1,000 Units Oral Daily  . clarithromycin  500 mg Oral Q12H  . feeding supplement (PRO-STAT SUGAR FREE 64)  30 mL Oral QID  . feeding supplement (RESOURCE BREEZE)  1 Container Oral TID BM  . ferrous sulfate  325 mg Oral BID WC  . hydrocortisone sod succinate (SOLU-CORTEF) inj  50 mg Intravenous Q12H  . levothyroxine  25 mcg Oral QAC breakfast  . pantoprazole  40 mg Oral BID AC  . sodium chloride  3 mL Intravenous Q12H  . cyanocobalamin  500 mcg Oral Daily  . ascorbic acid  500 mg Oral BID   Continuous Infusions:    Principal Problem:   Sepsis Active Problems:   HYPERTENSION, PULMONARY   DVT   UTI  (lower urinary tract infection)   Hypothyroidism   GIB (gastrointestinal bleeding)   Avascular necrosis of bone of left hip   Failure to thrive   Malnutrition   Diastolic CHF   HTN (hypertension)   High anion gap metabolic acidosis   Acute renal failure   Protein-calorie malnutrition, severe   H. pylori infection   Time spent 30 minutes

## 2014-01-15 NOTE — Progress Notes (Signed)
Dr. Grandville Silos notified of patient current hemoglobin and vital signs. Patient c/o shortness of breath. 96% on room air. O2 applied at 2 liters for comfort. Will get chest xray as ordered by physician and continue to monitor.

## 2014-01-15 NOTE — Progress Notes (Addendum)
Subjective:  Patient alert. First thing she said is "I'm not having anymore tests". She had couple of more bloody loose stools this morning. Denies abdominal pain. No vomiting.   Objective: Vital signs in last 24 hours: Temp:  [97.3 F (36.3 C)-97.6 F (36.4 C)] 97.5 F (36.4 C) (06/04 0534) Pulse Rate:  [85-104] 95 (06/04 0534) Resp:  [20-24] 20 (06/04 0534) BP: (102-126)/(63-81) 102/63 mmHg (06/04 0534) SpO2:  [96 %-98 %] 97 % (06/04 0534) Weight:  [214 lb 15.2 oz (97.5 kg)] 214 lb 15.2 oz (97.5 kg) (06/04 0534) Last BM Date: 01/15/14 General:   Alert,  Well-developed, well-nourished, pleasant and cooperative in NAD Head:  Normocephalic and atraumatic. Eyes:  Sclera clear, no icterus.   Abdomen:  Soft, nontender and nondistended.  Normal bowel sounds, without guarding, and without rebound.   Extremities:  3+edema in ankles bilaterally. Upper extremity edema/weeping. Neurologic:  Alert and  oriented x4;  grossly normal neurologically. Skin:  Intact without significant lesions or rashes. Psych:  Alert and cooperative. Normal mood and affect.  Intake/Output from previous day: 06/03 0701 - 06/04 0700 In: 360 [P.O.:360] Out: 1902 [Urine:1900; Stool:2] Intake/Output this shift:    Lab Results: CBC  Recent Labs  01/14/14 0541 01/14/14 1206 01/14/14 1649 01/15/14 0520  WBC 13.1*  --   --   --   HGB 11.5* 11.5* 11.7* 10.2*  HCT 33.1* 34.1* 34.2* 29.8*  MCV 88.0  --   --   --   PLT 416*  --   --   --    BMET  Recent Labs  01/13/14 0539 01/14/14 0541 01/15/14 0520  NA 135* 135* 138  K 3.9 4.1 3.8  CL 104 104 107  CO2 18* 20 18*  GLUCOSE 111* 98 107*  BUN 56* 58* 57*  CREATININE 1.05 1.03 0.92  CALCIUM 8.8 9.0 9.3   Lab Results  Component Value Date   ALT 12 01/10/2014   AST 16 01/10/2014   ALKPHOS 98 01/10/2014   BILITOT <0.2* 01/10/2014    Imaging Studies:   Ct Abdomen Pelvis W Contrast  01/08/2014   CLINICAL DATA:  Abdominal pain.  Vomiting and diarrhea.   EXAM: CT ABDOMEN AND PELVIS WITH CONTRAST  TECHNIQUE: Multidetector CT imaging of the abdomen and pelvis was performed using the standard protocol following bolus administration of intravenous contrast.  CONTRAST:  85mL OMNIPAQUE IOHEXOL 300 MG/ML SOLN, 3mL OMNIPAQUE IOHEXOL 300 MG/ML SOLN  COMPARISON:  Multiple exams, including 02/20/2011 and 07/19/2010  FINDINGS: Both moderate bilateral pleural effusions with passive atelectasis. Elevated right hemidiaphragm. The liver, spleen, with adrenal glands, and pancreas appear unremarkable.  An infrarenal IVC filter is present. There has been further if flattening and severe a atrophy of the left kidney. There continues to be a partially calcified 2.6 x 1.8 cm left kidney upper pole mass, slightly reduced in size compared to the prior exam.  Diffuse subcutaneous and mesenteric edema. Portal vein and splenic vein appear patent.  Right kidney unremarkable. There is several mildly dilated scattered loops of small bowel, without a well-defined point of obstruction. Appendix not well seen. There is prominent sigmoid diverticulosis with scattered diverticula of the descending colon. Orally administered contrast makes its way through to the colon.  There appear to be numerous large diverticula in the small bowel, measuring up to 6 cm in diameter, with air-fluid levels. No extraluminal gas or extraluminal contrast.  Considerable omental portosystemic varices. Small amount of pelvic ascites. Presacral edema along with extensive subcutaneous edema  in the lower pelvis.  Prior pinning of the left hip noted. There is severe osteoarthritis along with articular surface irregularity along the left femoral head, along with a left hip effusion.  IMPRESSION: 1. Considerable third spacing of fluid with subcutaneous and mesenteric edema in addition to moderate bilateral pleural effusions and a small amount of ascites. 2. Considerable omental portosystemic varices/shunting indicating portal  venous hypertension. 3. Numerous scattered diverticula of the small bowel, measuring up to 6 cm in diameter. I do not identify any of these is being inflamed. There is also descending and sigmoid colon diverticulosis. 4. Mildly elevated right hemidiaphragm. 5. Reduced size of the calcified left kidney upper pole mass, arguing against malignancy. The left kidney is severely atrophic and flattened. 6. Pinning of the left hip, with left hip superior cortical irregularity and severe left hip osteoarthritis. There is been some collapse of articular bone and and volume in the left femoral head, leading to joint exposure of one of the pin tips.   Electronically Signed   By: Sherryl Barters M.D.   On: 01/08/2014 17:54     01/13/2014   CLINICAL DATA:  Portosystemic varices on recent CT. Evaluate for cul liver disease.  EXAM: ULTRASOUND ABDOMEN  ULTRASOUND HEPATIC ELASTOGRAPHY  TECHNIQUE: Sonography of the upper abdomen was perform, in addition, ultrasound elastography evaluation of the liver was performed. A region of interest was placed within the right lobe of the liver. Following application of a compressive sonographic pulse, shear waves were detected in the adjacent hepatic tissue and the shear wave velocity was calculated. Multiple assessments were performed at the selected site. Median shear wave velocity is correlated to a Metavir fibrosis score.  COMPARISON:  None.  CORRELATIVE IMAGING: CT 01/08/2014  FINDINGS: ULTRASOUND ABDOMEN  Gallbladder:  Limited visualization due to overlying bowel gas. There appears to be some sludge within the gallbladder. Wall appears mildly thickened at 5-6 mm. Negative sonographic Murphy's.  Common bile duct:  Diameter: Normal caliber, 4 mm  Liver:  Heterogeneous echotexture. Slightly nodular contours of the liver suggest cirrhosis. No focal abnormality.  IVC:  Not visualized due to body habitus and overlying bowel gas.  Pancreas:  Not visualized due to body habitus and overlying bowel  gas.  Spleen:  Not visualized due to body habitus and overlying bowel gas.  Right Kidney:  Length: Limited visualization, 8.9 cm.  No hydronephrosis.  Left Kidney:  Length: Not visualized due to body habitus and overlying bowel gas.  Abdominal aorta:  Not visualized due to body habitus and overlying bowel gas  Other findings:  Right pleural effusion incidentally noted.  ULTRASOUND HEPATIC ELASTOGRAPHY  Hepatic Segment:  8  Number of measurements:  13  Median velocity:   0.91  m/sec  Corresponding Metavir fibrosis score:  F 0  Pertinent liver disease findings noted on other imaging exams: None noted or none available  Please note that abnormal shear wave velocities may also be identified in clinical settings other than with hepatic fibrosis, such as: Acute hepatitis, elevated right heart and central venous pressures, veno-occlusive disease (Budd-Chiari), infiltrative processes such as mastocytosis/amyloidosis/infiltrative tumor, extrahepatic cholestasis, in the post-prandial state, and liver transplantation. Correlation with patient history, laboratory data, and clinical condition recommended.  IMPRESSION: ULTRASOUND ABDOMEN: Very limited study due to body habitus and overlying bowel gas. The liver appears heterogeneous and contours appear mildly nodular suggesting the possibility of cirrhosis.  Limited visualization the gallbladder appears to demonstrates sludge and wall thickening. Negative sonographic Murphy's.  ULTRASOUND HEPATIC ELASTOGRAHY:  Median hepatic shear wave velocity is calculated at 0.91 m/sec, which corresponds to a Metavir fibrosis score of F 0.   Electronically Signed   By: Rolm Baptise M.D.   On: 01/13/2014 12:34  [2 weeks]   Assessment:  80 year old female admitted with acute renal failure, UTI, sepsis. CT reviewed considerable omental portosystemic varices/shunting with question of portal venous hypertension. Elastography completed with no fibrosis apparent. Unclear the significance of CT  findings.   As new issue: patient now with moderate sized burgundy stools this admission. Two more loose bloody stools this morning. Hgb down another gram. Complicated by Xarelto, which she takes each evening due to history of DVT. She had an IVC filter placed 12/2013. She had rectal bleeding in March 2015, with inpatient procedures to include colonoscopy and upper endoscopy revealing large internal hemorrhoids, moderate diverticulosis in descending and sigmoid colon, and EGD showed mild erosive gastritis, moderate diverticulosis in duodenum. H.pylori serology strongly positive, with generic Prevpac treatment started yesterday. Suspect either diverticular bleed (H/O duodenal diverticula and colonic) or hemorrhoidal.   Plan: 1. D/C Xarelto due to active bleeding. Last dose yesterday evening. Discussed with patient. 2. Monitor bleeding and Hgb. At this time patient is not willing to undergo further testing ie RBC tagged study or repeat endoscopy. Recheck Hgb at noon and in AM. 3. Complete H.pylori treatment and continue PPI.   LOS: 7 days   Mahala Menghini  01/15/2014, 8:20 AM  Attending note:  Patient seen and examined. Xarelto now stopped. Agree with following hemoglobin. Will continue to follow.  Addendum: Discussed rationale of stopping Xarelto with Dr. Grandville Silos and patient. Spoke with nursing staff she has had two burgundy stools since 7am. H/H at 1050 stable. WBC up some in setting of solu-cortef. Repeat CBC in AM. Check H/H once more this afternoon.

## 2014-01-16 DIAGNOSIS — D62 Acute posthemorrhagic anemia: Secondary | ICD-10-CM

## 2014-01-16 LAB — COMPREHENSIVE METABOLIC PANEL
ALK PHOS: 74 U/L (ref 39–117)
ALT: 11 U/L (ref 0–35)
AST: 12 U/L (ref 0–37)
Albumin: 1.3 g/dL — ABNORMAL LOW (ref 3.5–5.2)
BUN: 55 mg/dL — ABNORMAL HIGH (ref 6–23)
CALCIUM: 9.8 mg/dL (ref 8.4–10.5)
CO2: 22 mEq/L (ref 19–32)
Chloride: 105 mEq/L (ref 96–112)
Creatinine, Ser: 0.9 mg/dL (ref 0.50–1.10)
GFR calc Af Amer: 67 mL/min — ABNORMAL LOW (ref >90)
GFR calc non Af Amer: 58 mL/min — ABNORMAL LOW (ref >90)
Glucose, Bld: 99 mg/dL (ref 70–99)
POTASSIUM: 3.8 meq/L (ref 3.7–5.3)
SODIUM: 138 meq/L (ref 137–147)
TOTAL PROTEIN: 3.3 g/dL — AB (ref 6.0–8.3)
Total Bilirubin: <0.2 mg/dL — ABNORMAL LOW (ref 0.3–1.2)

## 2014-01-16 LAB — CBC
HCT: 26.6 % — ABNORMAL LOW (ref 36.0–46.0)
HEMOGLOBIN: 9.1 g/dL — AB (ref 12.0–15.0)
MCH: 30.1 pg (ref 26.0–34.0)
MCHC: 34.2 g/dL (ref 30.0–36.0)
MCV: 88.1 fL (ref 78.0–100.0)
PLATELETS: 324 10*3/uL (ref 150–400)
RBC: 3.02 MIL/uL — ABNORMAL LOW (ref 3.87–5.11)
RDW: 16.4 % — ABNORMAL HIGH (ref 11.5–15.5)
WBC: 14.8 10*3/uL — ABNORMAL HIGH (ref 4.0–10.5)

## 2014-01-16 LAB — HEMOGLOBIN AND HEMATOCRIT, BLOOD
HCT: 26.8 % — ABNORMAL LOW (ref 36.0–46.0)
HEMOGLOBIN: 9.1 g/dL — AB (ref 12.0–15.0)

## 2014-01-16 MED ORDER — HYDROCORTISONE ACETATE 25 MG RE SUPP
25.0000 mg | Freq: Two times a day (BID) | RECTAL | Status: DC
  Administered 2014-01-16 – 2014-01-20 (×6): 25 mg via RECTAL
  Filled 2014-01-16 (×10): qty 1

## 2014-01-16 MED ORDER — HYDROCORTISONE NA SUCCINATE PF 100 MG IJ SOLR
50.0000 mg | Freq: Every day | INTRAMUSCULAR | Status: DC
  Administered 2014-01-17 – 2014-01-18 (×2): 50 mg via INTRAVENOUS
  Filled 2014-01-16 (×2): qty 2

## 2014-01-16 MED ORDER — AMOXICILLIN 250 MG PO CAPS
1000.0000 mg | ORAL_CAPSULE | Freq: Two times a day (BID) | ORAL | Status: DC
  Administered 2014-01-16 – 2014-01-20 (×8): 1000 mg via ORAL
  Filled 2014-01-16 (×8): qty 4

## 2014-01-16 MED ORDER — CLARITHROMYCIN 500 MG PO TABS
500.0000 mg | ORAL_TABLET | Freq: Two times a day (BID) | ORAL | Status: DC
  Administered 2014-01-16 – 2014-01-20 (×8): 500 mg via ORAL
  Filled 2014-01-16 (×8): qty 1

## 2014-01-16 NOTE — Progress Notes (Addendum)
Recent IVC FILTER PLACED. PT NOW OF XARELTO DUE TO BRBPR. DISCUSSED WITH T. KEFALAS, PA-C. PT SHOULD REMAIN ON ANTI-COAGULATION. IVC FILTER SUBJECT TO CLOTTING OFF. BRBPR LIKELY DUE TO HEMORRHOIDS. ANN ANUSOL HC SUPP. START HEPARIN GTT W/O BOLUS. IF BRBRP RE-OCCURS RISK OF ANTI-COAGULATION TOO GREAT AND WOULD D/C HEPARIN & CONSIDER HEMORRHOID BANDING. AT THIS TIME BENEFITS OF BANDING DO NOT OUTWEIGH RISK OF IVC CLOT.  Jun 5: 1600-spoke with pt. Desires to be HOSPICE. FAMILY MEETING PLANNED FOR SUN 1 PM. WILL AWAIT MEETING. CONTINUE ANUSOL SUPP. SUPPORTIVE CARE.

## 2014-01-16 NOTE — Clinical Social Work Note (Signed)
Patient made DNR per MD, can return to [facility] at discharge if appropriate.  SNF willing to accept patient when ready.  Edwyna Shell, LCSW Clinical Social Worker [phone])

## 2014-01-16 NOTE — Progress Notes (Signed)
PROGRESS NOTE  Holly Sloan HGD:[id number] DOB: Dec 22, 1928 DOA: 01/08/2014 PCP: [name], MD  Summary: 80 year old woman resident of [facility] facility underwent IVC filter placement 5/21, since that time has had numerous episodes of nausea, vomiting and diarrhea. In the emergency department found to have acute renal failure, UTI, hypotension suggesting sepsis. Patient has declined steadily over the last few months and is essentially non-ambulatory because of left hip pain. Status post IVC filter placement on 01/01/2013  Assessment/Plan: 1. Suspected sepsis with hypotension, lactic acidosis, AG metabolic acidosis on admission, secondary to UTI. No leukocytosis, remains afebrile. Appears stable at this point. Status post 7 days of antibiotics. 2. Hypotension, persistant. Improved with empiric solu-cortef though cortisol was normal. Taper stress dose steriods. 3. UTI CITROBACTER FREUNDII and Klebsiella pneumonaie. Status post 7 days of antibiotic treatment.  4. Acute renal failure. Likely secondary to vomiting, diarrhea, sepsis, diuretics. Creatinine within normal limits. BUN slowly trending downwards. 5. Anasarca secondary to longstanding hypoalbuminemia from chronic disease, severe. Evaluated by oncology in the past.  6. AG-metabolic acidosis suspect secondary to lactic acidosis secondary to sepsis. Resolved. 7. Nausea, vomiting, intermittent diarrhea for 5-6 days with intermittent lower abdominal pain. None since admission. No acute findings on CT to explain. 8. Considerable omental portosystemic varices/shunting indicating portal venous hypertension. Etiology unclear. GI ff 9. Failure to thrive. Fair intake during this hospitalization. 10. Chronic diastolic congestive heart failure. Stable. 11. History of DVT x2, most recently 10/2013, status post IVC filter placement 5/21, on Xarelto. 12. History of rectal bleeding 10/2013. No bowel movement today. Patient with elevated BUN. H&H is  stable. Patient currently off xarelto. 14. History of left hip fracture 9622, complicated by avascular necrosis, total hip replacement being planned. 14. Dysphagia. Continue dysphagia 3 diet. 15. Severe malnutrition in context of chronic illness 16. H. pylori infection 17. Leukocytosis: Likely steriod induced. Patient status post treatment for UTI. Follow.   Clinically stable with improved BP. Continue oral ceftin.  Continue gentle diuresis, Taper empiric Solu-cortef.  Serial CBCs. D/c Xarelto. Patient has been started on a heparin drip secondary to concerns for clotting off IVC filter. Patient is to start on Anusol suppositories. Follow H&H.  GI ff  BMP and CBC in the morning  No family at bedside. Anasarca is longstanding and will be slow to improve, IF nutrition status and albumin improve.   defer to GI as to whether to hold xarelto light of GI bleed.  Patient has been started on treatment for H. pylori per GI.  Prognosis. Patient with multiple chronic medical issues. Patient now with GI bleed felt to likely be hemorrhoidal in nature. Patient has been started on Anusol suppositories. Continue to follow H&H. Patient also noted was severe malnutrition the context of chronic illness. Patient status post treatment for urinary tract infection. Patient states she is tired and asking for comfort measures. Discussed with patient that we'll change her CODE STATUS to DO NOT RESUSCITATE. Will arrange a family meeting with patient and her daughters on Sunday at approximately 1 PM, to discuss goals of care.  Code Status: DNR DVT prophylaxis: scd Family Communication:  Disposition Plan: likely return to [facility] versus home with hospice  [name], MD  [facility]  Pager [phone] If 7PM-7AM, please contact night-coverage at [email or web address], password [facility] 01/16/2014, 5:17 PM  LOS: 8 days   Consultants:  Gastroenterology: Dr Gala Romney 01/12/2014  Procedures:  2-D echocardiogram:  Left ventricular ejection fraction 55-60%. Grade 1 diastolic dysfunction.  Abdominal US 01/13/2014  Antibiotics:  Cefepime 5/28 >>01/13/14   Vancomycin 5/29 >>01/11/14  Oral Ceftin 01/13/2014>>>>01/15/14  Amoxicillin 01/13/2014  HPI/Subjective: Patient with no bowel movement today.  No n/v/abd pain. No pain. Patient stated did not want any more procedures. Patient asking as to whether she can be comfort care.  Objective: Filed Vitals:   01/15/14 1755 01/15/14 2035 01/16/14 0642 01/16/14 1448  BP: 95/51 116/72 98/47 109/75  Pulse: 96 102 88 100  Temp: 97.9 F (36.6 C) 97.7 F (36.5 C) 97.5 F (36.4 C) 98 F (36.7 C)  TempSrc:  Axillary Oral Oral  Resp: 22 22 20 20   Height:      Weight:   97.7 kg (215 lb 6.2 oz)   SpO2: 100% 100% 96% 97%    Intake/Output Summary (Last 24 hours) at 01/16/14 1717 Last data filed at 01/16/14 1200  Gross per 24 hour  Intake    840 ml  Output      0 ml  Net    840 ml     Filed Weights   01/14/14 0500 01/15/14 0534 01/16/14 0642  Weight: 94.4 kg (208 lb 1.8 oz) 97.5 kg (214 lb 15.2 oz) 97.7 kg (215 lb 6.2 oz)    Exam:   Remains afebrile, blood pressure stable. No hypoxia. Gen. Appears weak, chronically ill, non-toxic Psych. Alert, participates in exam and history Eyes. Wears glasses, PERRL, lids appear normal ENT. Lips appear normal tongue dry with grey residue Cardiovascular. RRR no m/r/g. No change in 3+ BLE up to pelvis. BUE edema improved significantly. Telemetry SR. Respiratory. Decreased breath sounds in the bases. No wheezing.  Normal resp effort. Abdomen. Soft ntnd, positive bowel sounds, Skin. No rash or induration Musculoskeletal. Moves all extremities Neurologic. Non-focal  Data Reviewed: I/O: -442 and 24 hours. Urine output 1400 Chemistry: Creatinine preserved 1.05. BUN without significant change 56. Normal potassium. Heme: CBC within normal limits ID: Urine culture with CITROBACTER FREUNDII/Klebsiella pneum Other:  Cortisol random 16.1  Scheduled Meds: . acidophilus  1 capsule Oral Daily  . amoxicillin  1,000 mg Oral BID  . cholecalciferol  1,000 Units Oral Daily  . clarithromycin  500 mg Oral BID  . feeding supplement (PRO-STAT SUGAR FREE 64)  30 mL Oral QID  . feeding supplement (RESOURCE BREEZE)  1 Container Oral TID BM  . hydrocortisone  25 mg Rectal BID  . [START ON 01/17/2014] hydrocortisone sod succinate (SOLU-CORTEF) inj  50 mg Intravenous Daily  . levothyroxine  25 mcg Oral QAC breakfast  . pantoprazole  40 mg Oral BID AC  . sodium chloride  3 mL Intravenous Q12H  . cyanocobalamin  500 mcg Oral Daily  . ascorbic acid  500 mg Oral BID   Continuous Infusions:    Principal Problem:   Sepsis Active Problems:   HYPERTENSION, PULMONARY   DVT   UTI (lower urinary tract infection)   Hypothyroidism   GIB (gastrointestinal bleeding)   Avascular necrosis of bone of left hip   Failure to thrive   Malnutrition   Diastolic CHF   HTN (hypertension)   High anion gap metabolic acidosis   Acute renal failure   Protein-calorie malnutrition, severe   H. pylori infection   Time spent 30 minutes

## 2014-01-16 NOTE — Progress Notes (Signed)
Subjective:  Patient denies any bowel movements overnight. No specific complaints today. Denies abdominal pain or vomiting.  Objective: Vital signs in last 24 hours: Temp:  [97.4 F (36.3 C)-97.9 F (36.6 C)] 97.5 F (36.4 C) (06/05 0642) Pulse Rate:  [88-102] 88 (06/05 0642) Resp:  [20-22] 20 (06/05 0642) BP: (95-116)/(47-72) 98/47 mmHg (06/05 0642) SpO2:  [96 %-100 %] 96 % (06/05 0642) Weight:  [215 lb 6.2 oz (97.7 kg)] 215 lb 6.2 oz (97.7 kg) (06/05 0642) Last BM Date: 01/15/14 General:   Alert,  Well-developed, well-nourished, pleasant and cooperative in NAD Head:  Normocephalic and atraumatic. Eyes:  Sclera clear, no icterus.  Abdomen:  Soft, nontender and nondistended. Normal bowel sounds, without guarding, and without rebound.   Extremities: 3+ lower extremity edema. Neurologic:  Alert and  oriented x4;  grossly normal neurologically. Skin:  Intact without significant lesions or rashes. Psych:  Alert and cooperative. Normal mood and affect.  Intake/Output from previous day: 06/04 0701 - 06/05 0700 In: 960 [P.O.:960] Out: 1402 [Urine:1400; Stool:2] Intake/Output this shift:    Lab Results: CBC  Recent Labs  01/14/14 0541  01/15/14 1050 01/15/14 1634 01/16/14 0552  WBC 13.1*  --  16.5*  --  14.8*  HGB 11.5*  < > 10.2* 9.7* 9.1*  HCT 33.1*  < > 29.9* 29.8* 26.6*  MCV 88.0  --  89.5  --  88.1  PLT 416*  --  368  --  324  < > = values in this interval not displayed. BMET  Recent Labs  01/14/14 0541 01/15/14 0520 01/16/14 0552  NA 135* 138 138  K 4.1 3.8 3.8  CL 104 107 105  CO2 20 18* 22  GLUCOSE 98 107* 99  BUN 58* 57* 55*  CREATININE 1.03 0.92 0.90  CALCIUM 9.0 9.3 9.8   LFTs  Recent Labs  01/16/14 0552  BILITOT <0.2*  ALKPHOS 74  AST 12  ALT 11  PROT 3.3*  ALBUMIN 1.3*   No results found for this basename: LIPASE,  in the last 72 hours PT/INR No results found for this basename: LABPROT, INR,  in the last 72 hours    Imaging Studies:    Dg Chest Port 1 View  01/15/2014   CLINICAL DATA:  Shortness of breath  EXAM: PORTABLE CHEST - 1 VIEW  COMPARISON:  01/08/2014  FINDINGS: The lungs are markedly hypoaerated with chronic elevation of the right hemidiaphragm. Heart size is normal. Trace pleural effusions are reidentified with central vascular congestion.  IMPRESSION: Cardiomegaly with central vascular congestion and trace effusions. Persistent low volumes.   Electronically Signed   By: Conchita Paris M.D.   On: 01/15/2014 19:28      Assessment: 80 year old female admitted with acute renal failure, UTI, sepsis. CT reviewed considerable omental portosystemic varices/shunting with question of portal venous hypertension. Elastography completed with no fibrosis apparent. Unclear the significance of CT findings.   GI bleed with moderate sized burgundy stools this admission. Patient elected to not undergo further testing. Xarelto was stopped, last dose Wednesday evening. Hgb continues to drift downward.  She had rectal bleeding in March 2015, with inpatient procedures to include colonoscopy and upper endoscopy revealing large internal hemorrhoids, moderate diverticulosis in descending and sigmoid colon, and EGD showed mild erosive gastritis, moderate diverticulosis in duodenum. H.pylori serology strongly positive, with generic Prevpac treatment started yesterday. Suspect either diverticular bleed (H/O duodenal diverticula and colonic) or hemorrhoidal.   Plan: 1. Repeat H/H at noon. We will go from there  to see if downward trend stops. Patient still not agreeable to undergo further testing at this time.  2. Continue to hold Xarelto for now.  3. Continue PPI/complete H.pylori treatment.    LOS: 8 days   Mahala Menghini  01/16/2014, 7:45 AM   Addendum: Hgb 9.1, stable at noon. Recommend checking again in the morning. No documented stools today.

## 2014-01-17 DIAGNOSIS — I4891 Unspecified atrial fibrillation: Secondary | ICD-10-CM

## 2014-01-17 DIAGNOSIS — K625 Hemorrhage of anus and rectum: Secondary | ICD-10-CM

## 2014-01-17 LAB — CBC
HCT: 30.3 % — ABNORMAL LOW (ref 36.0–46.0)
HEMOGLOBIN: 10 g/dL — AB (ref 12.0–15.0)
MCH: 29.9 pg (ref 26.0–34.0)
MCHC: 33 g/dL (ref 30.0–36.0)
MCV: 90.7 fL (ref 78.0–100.0)
Platelets: 413 10*3/uL — ABNORMAL HIGH (ref 150–400)
RBC: 3.34 MIL/uL — ABNORMAL LOW (ref 3.87–5.11)
RDW: 16.6 % — ABNORMAL HIGH (ref 11.5–15.5)
WBC: 25.2 10*3/uL — ABNORMAL HIGH (ref 4.0–10.5)

## 2014-01-17 LAB — BASIC METABOLIC PANEL
BUN: 57 mg/dL — ABNORMAL HIGH (ref 6–23)
CALCIUM: 9.8 mg/dL (ref 8.4–10.5)
CO2: 22 mEq/L (ref 19–32)
CREATININE: 0.92 mg/dL (ref 0.50–1.10)
Chloride: 103 mEq/L (ref 96–112)
GFR calc Af Amer: 65 mL/min — ABNORMAL LOW (ref >90)
GFR, EST NON AFRICAN AMERICAN: 56 mL/min — AB (ref >90)
GLUCOSE: 94 mg/dL (ref 70–99)
Potassium: 3.7 mEq/L (ref 3.7–5.3)
SODIUM: 134 meq/L — AB (ref 137–147)

## 2014-01-17 MED ORDER — SODIUM CHLORIDE 0.9 % IV BOLUS (SEPSIS)
250.0000 mL | Freq: Once | INTRAVENOUS | Status: AC
  Administered 2014-01-17: 250 mL via INTRAVENOUS

## 2014-01-17 MED ORDER — SODIUM CHLORIDE 0.9 % IV BOLUS (SEPSIS)
500.0000 mL | Freq: Once | INTRAVENOUS | Status: AC
  Administered 2014-01-17: 500 mL via INTRAVENOUS

## 2014-01-17 NOTE — Progress Notes (Signed)
PROGRESS NOTE  Holly Sloan:[id number] DOB: [birth year] DOA: 01/08/2014 PCP: [name], MD  Summary: 80 year old woman resident of [facility] facility underwent IVC filter placement 5/21, since that time has had numerous episodes of nausea, vomiting and diarrhea. In the emergency department found to have acute renal failure, UTI, hypotension suggesting sepsis. Patient has declined steadily over the last few months and is essentially non-ambulatory because of left hip pain. Status post IVC filter placement on 01/01/2013  Assessment/Plan: 1. Suspected sepsis with hypotension, lactic acidosis, AG metabolic acidosis on admission, secondary to UTI. No leukocytosis, remains afebrile. Appears stable at this point. Status post 7 days of antibiotics. 2. Hypotension, persistant. Improved with empiric solu-cortef though cortisol was normal. Taper stress dose steriods. 3. New onset atrial fibrillation: May be secondary to patient's anemia/GI bleed and current medical illness. Patient asymptomatic. Patient is wanting to be transitioned to comfort care and as such will give fluid boluses and just monitor for now. Patient on IV heparin. Patient's HR is somewhat controlled in the low 100s. 4. UTI CITROBACTER FREUNDII and Klebsiella pneumonaie. Status post 7 days of antibiotic treatment.  5. Acute renal failure. Likely secondary to vomiting, diarrhea, sepsis, diuretics. Creatinine within normal limits. BUN slowly trending downwards. 6. Anasarca secondary to longstanding hypoalbuminemia from chronic disease, severe. Evaluated by oncology in the past.  7. AG-metabolic acidosis suspect secondary to lactic acidosis secondary to sepsis. Resolved. 8. Nausea, vomiting, intermittent diarrhea for 5-6 days with intermittent lower abdominal pain. None since admission. No acute findings on CT to explain. 9. Considerable omental portosystemic varices/shunting indicating portal venous hypertension. Etiology unclear.  GI ff 10. Failure to thrive. Fair intake during this hospitalization. 11. Chronic diastolic congestive heart failure. Stable. 12. History of DVT x2, most recently 10/2013, status post IVC filter placement 5/21, off Xarelto. On IV heparin. 13. History of rectal bleeding 10/2013. No bowel movement today. Patient with elevated BUN. H&H is stable. Patient currently off xarelto. 14. History of left hip fracture 2263, complicated by avascular necrosis, total hip replacement being planned. 15. Dysphagia. Continue dysphagia 3 diet. 16. Severe malnutrition in context of chronic illness 17. H. pylori infection 18. Leukocytosis: Likely steriod induced. Patient status post treatment for UTI. Follow.   Clinically stable with improved BP. Continue oral ceftin.  Continue gentle diuresis, Taper empiric Solu-cortef.  Serial CBCs. D/c Xarelto. Patient has been started on a heparin drip secondary to concerns for clotting off IVC filter. Patient on Anusol suppositories. Follow H&H.  GI ff  BMP and CBC in the morning No family at bedside. Anasarca is longstanding and will be slow to improve, IF nutrition status and albumin improve.   Patient has been started on treatment for H. pylori per GI.  Prognosis. Patient with multiple chronic medical issues. Patient now with GI bleed felt to likely be hemorrhoidal in nature. Patient has been started on Anusol suppositories. Continue to follow H&H. Patient also noted was severe malnutrition the context of chronic illness. Patient status post treatment for urinary tract infection. Patient states she is tired and asking for comfort measures. Discussed with patient that we'll change her CODE STATUS to DO NOT RESUSCITATE. Will arrange a family meeting with patient and her daughters on Sunday at approximately 1 PM, to discuss goals of care.  Code Status: DNR DVT prophylaxis: scd Family Communication: Updated patient, daughter and best friend at bedside. Disposition Plan:  likely return to [facility] versus home with hospice versus hospice home  [name], MD  [facility]  Pager [phone] If 7PM-7AM, please contact night-coverage at [email or web address], password [facility] 01/17/2014, 11:00 AM  LOS: 9 days   Consultants:  Gastroenterology: Dr Gala Romney 01/12/2014  Procedures:  2-D echocardiogram: Left ventricular ejection fraction 55-60%. Grade 1 diastolic dysfunction.  Abdominal US 01/13/2014  Antibiotics:  Cefepime 5/28 >>01/13/14   Vancomycin 5/29 >>01/11/14  Oral Ceftin 01/13/2014>>>>01/15/14  Amoxicillin 01/13/2014  HPI/Subjective: Patient with no bowel movement today.  No n/v/abd pain. No pain. Patient stated did not want any more procedures. No complaints  Objective: Filed Vitals:   01/16/14 2004 01/16/14 2318 01/17/14 0745 01/17/14 0919  BP:  91/55 89/56 97/58   Pulse:  114  116  Temp:      TempSrc:  Oral    Resp:  20    Height:      Weight:      SpO2: 96% 100%      Intake/Output Summary (Last 24 hours) at 01/17/14 1100 Last data filed at 01/17/14 0800  Gross per 24 hour  Intake    480 ml  Output   3000 ml  Net  -2520 ml     Filed Weights   01/14/14 0500 01/15/14 0534 01/16/14 0642  Weight: 94.4 kg (208 lb 1.8 oz) 97.5 kg (214 lb 15.2 oz) 97.7 kg (215 lb 6.2 oz)    Exam:   Remains afebrile, blood pressure borderline. No hypoxia. Gen. Appears weak, chronically ill, non-toxic Psych. Alert, participates in exam and history Eyes. Wears glasses, PERRL, lids appear normal ENT. Lips appear normal tongue dry with grey residue Cardiovascular. RRR no m/r/g. No change in 3+ BLE up to pelvis. BUE edema improved significantly. Atrial fibrillation. Respiratory. Decreased breath sounds in the bases. No wheezing.  Normal resp effort. Abdomen. Soft ntnd, positive bowel sounds, Skin. No rash or induration Musculoskeletal. Moves all extremities Neurologic. Non-focal  Data Reviewed: I/O: -2520 and 24 hours. Urine output 3000 Chemistry:  Creatinine preserved 1.05. BUN without significant change 56. Normal potassium. Heme: CBC within normal limits ID: Urine culture with CITROBACTER FREUNDII/Klebsiella pneum Other: Cortisol random 16.1  Scheduled Meds: . acidophilus  1 capsule Oral Daily  . amoxicillin  1,000 mg Oral BID  . cholecalciferol  1,000 Units Oral Daily  . clarithromycin  500 mg Oral BID  . feeding supplement (PRO-STAT SUGAR FREE 64)  30 mL Oral QID  . feeding supplement (RESOURCE BREEZE)  1 Container Oral TID BM  . hydrocortisone  25 mg Rectal BID  . hydrocortisone sod succinate (SOLU-CORTEF) inj  50 mg Intravenous Daily  . levothyroxine  25 mcg Oral QAC breakfast  . pantoprazole  40 mg Oral BID AC  . sodium chloride  3 mL Intravenous Q12H  . cyanocobalamin  500 mcg Oral Daily  . ascorbic acid  500 mg Oral BID   Continuous Infusions:    Principal Problem:   Sepsis Active Problems:   New onset a-fib   HYPERTENSION, PULMONARY   DVT   UTI (lower urinary tract infection)   Hypothyroidism   GIB (gastrointestinal bleeding)   Avascular necrosis of bone of left hip   Failure to thrive   Malnutrition   Diastolic CHF   HTN (hypertension)   High anion gap metabolic acidosis   Acute renal failure   Protein-calorie malnutrition, severe   H. pylori infection   Time spent 30 minutes

## 2014-01-17 NOTE — Progress Notes (Signed)
At 0550, I did assessment and pts heart rhythm sounded irregular, no documented hx of irregularity. Dr. Darrick Meigs notified, order received to place pt on cardiac monitoring, and if something showed up to get 12 lead ekg. Telemetry reading Afib. STAT 12 lead was ordered, results show pt in Afib w/ RVR with a rate of 109. Manual BP was 86/62 checked by Nurse Tech. Pt is resting comfortably at this time. Dr. Grandville Silos was notifed of EKG results & BP results at 0705. Order was placed for NS bolus of 250 mls. Sederick Jacobsen Junius Creamer

## 2014-01-17 NOTE — Progress Notes (Signed)
Blood pressure 92/58 manually after bolus. Changed telemetry box and leads due to false alarms. Now running a fib 88.

## 2014-01-18 LAB — BASIC METABOLIC PANEL
BUN: 65 mg/dL — ABNORMAL HIGH (ref 6–23)
CO2: 21 meq/L (ref 19–32)
CREATININE: 0.96 mg/dL (ref 0.50–1.10)
Calcium: 9.4 mg/dL (ref 8.4–10.5)
Chloride: 103 mEq/L (ref 96–112)
GFR, EST AFRICAN AMERICAN: 62 mL/min — AB (ref >90)
GFR, EST NON AFRICAN AMERICAN: 54 mL/min — AB (ref >90)
Glucose, Bld: 106 mg/dL — ABNORMAL HIGH (ref 70–99)
Potassium: 3.8 mEq/L (ref 3.7–5.3)
SODIUM: 134 meq/L — AB (ref 137–147)

## 2014-01-18 LAB — CBC
HCT: 22.8 % — ABNORMAL LOW (ref 36.0–46.0)
Hemoglobin: 7.6 g/dL — ABNORMAL LOW (ref 12.0–15.0)
MCH: 30.4 pg (ref 26.0–34.0)
MCHC: 33.3 g/dL (ref 30.0–36.0)
MCV: 91.2 fL (ref 78.0–100.0)
PLATELETS: 261 10*3/uL (ref 150–400)
RBC: 2.5 MIL/uL — AB (ref 3.87–5.11)
RDW: 16.6 % — AB (ref 11.5–15.5)
WBC: 10.9 10*3/uL — ABNORMAL HIGH (ref 4.0–10.5)

## 2014-01-18 NOTE — Progress Notes (Signed)
PROGRESS NOTE  Holly Sloan BJS:[id number] DOB: Jan 07, 1929 DOA: 01/08/2014 PCP: [name], MD  Summary: 80 year old woman resident of [facility] facility underwent IVC filter placement 5/21, since that time has had numerous episodes of nausea, vomiting and diarrhea. In the emergency department found to have acute renal failure, UTI, hypotension suggesting sepsis. Patient has declined steadily over the last few months and is essentially non-ambulatory because of left hip pain. Status post IVC filter placement on 01/01/2013  Assessment/Plan: 1. Suspected sepsis with hypotension, lactic acidosis, AG metabolic acidosis on admission, secondary to UTI. No leukocytosis, remains afebrile. Appears stable at this point. Status post 7 days of antibiotics. 2. Hypotension, persistant. Improved with empiric solu-cortef though cortisol was normal. Taper stress dose steriods. 3. New onset atrial fibrillation: May be secondary to patient's anemia/GI bleed and current medical illness. Patient asymptomatic. Patient is wanting to be transitioned to comfort care and as such will give fluid boluses and just monitor for now. Patient on IV heparin. Patient's HR is somewhat controlled in the low 100s. 4. UTI CITROBACTER FREUNDII and Klebsiella pneumonaie. Status post 7 days of antibiotic treatment.  5. Acute renal failure. Likely secondary to vomiting, diarrhea, sepsis, diuretics. Creatinine within normal limits. BUN slowly trending downwards. 6. Anasarca secondary to longstanding hypoalbuminemia from chronic disease, severe. Evaluated by oncology in the past.  7. AG-metabolic acidosis suspect secondary to lactic acidosis secondary to sepsis. Resolved. 8. Nausea, vomiting, intermittent diarrhea for 5-6 days with intermittent lower abdominal pain. None since admission. No acute findings on CT to explain. 9. Considerable omental portosystemic varices/shunting indicating portal venous hypertension. Etiology unclear.  GI ff 10. Failure to thrive. Fair intake during this hospitalization. 11. Chronic diastolic congestive heart failure. Stable. 12. History of DVT x2, most recently 10/2013, status post IVC filter placement 5/21, off Xarelto. On IV heparin. 13. History of rectal bleeding 10/2013. No bowel movement today. Patient with elevated BUN. H&H is stable. Patient currently off xarelto.Hgb 7.6. 14. History of left hip fracture 6073, complicated by avascular necrosis, total hip replacement being planned. 15. Dysphagia. Continue dysphagia 3 diet. 16. Severe malnutrition in context of chronic illness 17. H. pylori infection 18. Leukocytosis: Likely steriod induced. Patient status post treatment for UTI. Follow.   Clinically stable with improved BP. S/p full course antibiotics.  Continue gentle diuresis, Taper empiric Solu-cortef.  Serial CBCs. D/c Xarelto. Patient has been started on a heparin drip secondary to concerns for clotting off IVC filter. Patient on Anusol suppositories. Follow H&H.  GI ff  BMP and CBC in the morning Anasarca is longstanding and will be slow to improve, IF nutrition status and albumin improve.   Continue treatment for H. pylori per GI.  Comfort care. Consult with hospice for home hospice care.  Prognosis. Patient with multiple chronic medical issues. Patient now with GI bleed felt to likely be hemorrhoidal in nature. Patient has been started on Anusol suppositories. Continue to follow H&H. Patient also noted was severe malnutrition the context of chronic illness. Patient status post treatment for urinary tract infection. Patient states she is tired and asking for comfort measures. Discussed with patient code status has been changed to DO NOT RESUSCITATE. Met with patient and her 3 daughters discussed goals of care, and patient requesting comfort measures. Will consult with care management to arrange for home hospice. Will d/c telemetry. No more blood draws. Comfort feeds.  Code  Status: DNR DVT prophylaxis: scd Family Communication: Updated patient, daughter and best friend at bedside. Disposition Plan: likely return  home with hospice versus hospice home  [name], MD  [facility]  Pager [phone] If 7PM-7AM, please contact night-coverage at [email or web address], password [facility] 01/18/2014, 11:24 AM  LOS: 10 days   Consultants:  Gastroenterology: Dr Gala Romney 01/12/2014  Procedures:  2-D echocardiogram: Left ventricular ejection fraction 55-60%. Grade 1 diastolic dysfunction.  Abdominal US 01/13/2014  Antibiotics:  Cefepime 5/28 >>01/13/14   Vancomycin 5/29 >>01/11/14  Oral Ceftin 01/13/2014>>>>01/15/14  Amoxicillin 01/13/2014  HPI/Subjective: Patient with no further bloody BM.  No n/v/abd pain. No pain. Patient stated did not want any more procedures. No complaints  Objective: Filed Vitals:   01/17/14 1430 01/17/14 1550 01/17/14 2244 01/18/14 0634  BP: 89/42  107/63 101/63  Pulse: 101  96 97  Temp: 97.9 F (36.6 C)  97.3 F (36.3 C) 98.1 F (36.7 C)  TempSrc: Oral  Oral Oral  Resp: $Remo'16  20 20  'VjSCl$ Height:      Weight:    98.3 kg (216 lb 11.4 oz)  SpO2: 100% 96% 99% 98%    Intake/Output Summary (Last 24 hours) at 01/18/14 1124 Last data filed at 01/18/14 0645  Gross per 24 hour  Intake    480 ml  Output   1700 ml  Net  -1220 ml     Filed Weights   01/15/14 0534 01/16/14 0642 01/18/14 0634  Weight: 97.5 kg (214 lb 15.2 oz) 97.7 kg (215 lb 6.2 oz) 98.3 kg (216 lb 11.4 oz)    Exam:   Remains afebrile, blood pressure borderline. No hypoxia. Gen. Appears weak, chronically ill, non-toxic Psych. Alert, participates in exam and history Eyes. Wears glasses, PERRLA, lids appear normal ENT. Lips appear normal tongue dry with grey residue Cardiovascular. RRR no m/r/g. No change in 3+ BLE up to pelvis. BUE edema improved significantly. Atrial fibrillation. Respiratory. Decreased breath sounds in the bases. No wheezing.  Normal resp  effort. Abdomen. Soft ntnd, positive bowel sounds, Skin. No rash or induration Musculoskeletal. Moves all extremities Neurologic. Non-focal  Data Reviewed: I/O: -666.1/ 24 hours. Urine output 1700 Chemistry: Creatinine preserved 1.05. BUN without significant change 56. Normal potassium. Heme: CBC within normal limits ID: Urine culture with CITROBACTER FREUNDII/Klebsiella pneum Other: Cortisol random 16.1  Scheduled Meds: . acidophilus  1 capsule Oral Daily  . amoxicillin  1,000 mg Oral BID  . cholecalciferol  1,000 Units Oral Daily  . clarithromycin  500 mg Oral BID  . feeding supplement (PRO-STAT SUGAR FREE 64)  30 mL Oral QID  . feeding supplement (RESOURCE BREEZE)  1 Container Oral TID BM  . hydrocortisone  25 mg Rectal BID  . hydrocortisone sod succinate (SOLU-CORTEF) inj  50 mg Intravenous Daily  . levothyroxine  25 mcg Oral QAC breakfast  . pantoprazole  40 mg Oral BID AC  . sodium chloride  3 mL Intravenous Q12H  . cyanocobalamin  500 mcg Oral Daily  . ascorbic acid  500 mg Oral BID   Continuous Infusions:    Principal Problem:   Sepsis Active Problems:   New onset a-fib   HYPERTENSION, PULMONARY   DVT   UTI (lower urinary tract infection)   Hypothyroidism   GIB (gastrointestinal bleeding)   Avascular necrosis of bone of left hip   Failure to thrive   Malnutrition   Diastolic CHF   HTN (hypertension)   High anion gap metabolic acidosis   Acute renal failure   Protein-calorie malnutrition, severe   H. pylori infection   Time spent 45 minutes

## 2014-01-18 NOTE — Progress Notes (Signed)
Patient Hg 7.6. Dr. Grandville Silos notified.

## 2014-01-18 NOTE — Progress Notes (Signed)
History of blood clots. Refusing SCD's due to large edema to lower extremities. Causes her much pain.

## 2014-01-19 NOTE — Clinical Social Work Note (Signed)
CSW received report that patient will go home w hospice care, per MD family will arrange to take shifts caring for patient.  CSW signing off as patient does not intend to return to SNF, no SW needs identified at present.  Please reconsult SW if needed.  Edwyna Shell, LCSW Clinical Social Worker [phone])

## 2014-01-19 NOTE — Progress Notes (Signed)
PROGRESS NOTE  Holly Sloan VZC:[id number] DOB: 01/23/1929 DOA: 01/08/2014 PCP: [name], MD  Summary: 80 year old woman resident of [facility] facility underwent IVC filter placement 5/21, since that time has had numerous episodes of nausea, vomiting and diarrhea. In the emergency department found to have acute renal failure, UTI, hypotension suggesting sepsis. Patient has declined steadily over the last few months and is essentially non-ambulatory because of left hip pain. Status post IVC filter placement on 01/01/2013  Assessment/Plan: 1. Suspected sepsis with hypotension, lactic acidosis, AG metabolic acidosis on admission, secondary to UTI. No leukocytosis, remains afebrile. Appears stable at this point. Status post 7 days of antibiotics. 2. Hypotension, persistant. Improved with empiric solu-cortef though cortisol was normal. Taper stress dose steriods. 3. New onset atrial fibrillation: May be secondary to patient's anemia/GI bleed and current medical illness. Patient asymptomatic. Patient is wanting to be transitioned to comfort care and as such will give fluid boluses and just monitor for now. Patient on IV heparin. Patient's HR is somewhat controlled in the low 100s. 4. UTI CITROBACTER FREUNDII and Klebsiella pneumonaie. Status post 7 days of antibiotic treatment.  5. Acute renal failure. Likely secondary to vomiting, diarrhea, sepsis, diuretics. Creatinine within normal limits. BUN slowly trending downwards. 6. Anasarca secondary to longstanding hypoalbuminemia from chronic disease, severe. Evaluated by oncology in the past.  7. AG-metabolic acidosis suspect secondary to lactic acidosis secondary to sepsis. Resolved. 8. Nausea, vomiting, intermittent diarrhea for 5-6 days with intermittent lower abdominal pain. None since admission. No acute findings on CT to explain. 9. Considerable omental portosystemic varices/shunting indicating portal venous hypertension. Etiology unclear.  GI ff 10. Failure to thrive. Fair intake during this hospitalization. 11. Chronic diastolic congestive heart failure. Stable. 12. History of DVT x2, most recently 10/2013, status post IVC filter placement 5/21, off Xarelto. On IV heparin. 13. History of rectal bleeding 10/2013. No bowel movement today. Patient with elevated BUN. H&H is stable. Patient currently off xarelto.Hgb 7.6. 14. History of left hip fracture 1287, complicated by avascular necrosis, total hip replacement being planned. 15. Dysphagia. Continue dysphagia 3 diet. 16. Severe malnutrition in context of chronic illness 17. H. pylori infection 18. Leukocytosis: Likely steriod induced. Patient status post treatment for UTI. Follow.   Clinically stable with improved BP. S/p full course antibiotics.  Continue gentle diuresis, discontinue Solu-Cortef.  Serial CBCs. D/c Xarelto. Patient has been started on a heparin drip secondary to concerns for clotting off IVC filter. Patient on Anusol suppositories. Follow H&H.  GI ff  BMP and CBC in the morning Anasarca is longstanding and will be slow to improve, IF nutrition status and albumin improve.   Continue treatment for H. pylori per GI X 14 DAYS.  Comfort care. Care management has been notified to arrange hospice.   Prognosis. Patient with multiple chronic medical issues. Patient now with GI bleed felt to likely be hemorrhoidal in nature. Patient has been started on Anusol suppositories. Continue to follow H&H. Patient also noted was severe malnutrition the context of chronic illness. Patient status post treatment for urinary tract infection. Patient states she is tired and asking for comfort measures. Discussed with patient code status has been changed to DO NOT RESUSCITATE. Met with patient and her 3 daughters discussed goals of care, and patient requesting comfort measures. Care management has been notified and hospice will be set up. Patient hopefully to be discharged tomorrow  home with hospice.  Comfort care.  Code Status: DNR DVT prophylaxis: scd Family Communication: Updated patient, daughter at  bedside. Disposition Plan: likely return home with hospice hopefully tomorrow  [name], MD  [facility]  Pager [phone] If 7PM-7AM, please contact night-coverage at [email or web address], password [facility] 01/19/2014, 2:28 PM  LOS: 11 days   Consultants:  Gastroenterology: Dr Gala Romney 01/12/2014  Procedures:  2-D echocardiogram: Left ventricular ejection fraction 55-60%. Grade 1 diastolic dysfunction.  Abdominal US 01/13/2014  Antibiotics:  Cefepime 5/28 >>01/13/14   Vancomycin 5/29 >>01/11/14  Oral Ceftin 01/13/2014>>>>01/15/14  Amoxicillin 01/13/2014  HPI/Subjective: Patient with no further bloody BM.  No n/v/abd pain. No pain. Patient resting comfortably. Easily arousable.  Objective: Filed Vitals:   01/18/14 0634 01/18/14 1435 01/18/14 2202 01/19/14 0636  BP: 101/63 104/68 125/49 97/56  Pulse: 97 112 108 100  Temp: 98.1 F (36.7 C) 97.9 F (36.6 C) 97.2 F (36.2 C) 97.5 F (36.4 C)  TempSrc: Oral Oral Oral Oral  Resp: $Remo'20 20 20 20  'bFHFk$ Height:      Weight: 98.3 kg (216 lb 11.4 oz)   96.2 kg (212 lb 1.3 oz)  SpO2: 98% 100% 98% 100%    Intake/Output Summary (Last 24 hours) at 01/19/14 1428 Last data filed at 01/19/14 0639  Gross per 24 hour  Intake    240 ml  Output   1700 ml  Net  -1460 ml     Filed Weights   01/16/14 0642 01/18/14 0634 01/19/14 0636  Weight: 97.7 kg (215 lb 6.2 oz) 98.3 kg (216 lb 11.4 oz) 96.2 kg (212 lb 1.3 oz)    Exam:   Remains afebrile, blood pressure borderline. No hypoxia. Gen. Appears weak, chronically ill, non-toxic Psych. Sleeping comfortably easily arousable. Eyes. Wears glasses, PERRLA, lids appear normal ENT. Lips appear normal tongue dry with grey residue Cardiovascular. RRR no m/r/g. No change in 3+ BLE up to pelvis. BUE edema improved significantly. Atrial fibrillation. Respiratory. Decreased  breath sounds in the bases. No wheezing.  Normal resp effort. Abdomen. Soft ntnd, positive bowel sounds, Skin. No rash or induration Musculoskeletal. Moves all extremities Neurologic. Non-focal  Data Reviewed: I/O: -1646.1/ 24 hours. Urine output 1700 Chemistry: No labs today. Heme: No labs today. ID: Urine culture with CITROBACTER FREUNDII/Klebsiella pneum Other: Cortisol random 16.1  Scheduled Meds: . acidophilus  1 capsule Oral Daily  . amoxicillin  1,000 mg Oral BID  . cholecalciferol  1,000 Units Oral Daily  . clarithromycin  500 mg Oral BID  . feeding supplement (PRO-STAT SUGAR FREE 64)  30 mL Oral QID  . feeding supplement (RESOURCE BREEZE)  1 Container Oral TID BM  . hydrocortisone  25 mg Rectal BID  . hydrocortisone sod succinate (SOLU-CORTEF) inj  50 mg Intravenous Daily  . levothyroxine  25 mcg Oral QAC breakfast  . pantoprazole  40 mg Oral BID AC  . sodium chloride  3 mL Intravenous Q12H  . cyanocobalamin  500 mcg Oral Daily  . ascorbic acid  500 mg Oral BID   Continuous Infusions:    Principal Problem:   Sepsis Active Problems:   New onset a-fib   HYPERTENSION, PULMONARY   DVT   UTI (lower urinary tract infection)   Hypothyroidism   GIB (gastrointestinal bleeding)   Avascular necrosis of bone of left hip   Failure to thrive   Malnutrition   Diastolic CHF   HTN (hypertension)   High anion gap metabolic acidosis   Acute renal failure   Protein-calorie malnutrition, severe   H. pylori infection   Time spent 45 minutes

## 2014-01-19 NOTE — Progress Notes (Signed)
    Subjective: Denies abdominal pain. No rectal bleeding per patient and daughter at bedside.   Objective: Vital signs in last 24 hours: Temp:  [97.2 F (36.2 C)-97.9 F (36.6 C)] 97.5 F (36.4 C) (06/08 0636) Pulse Rate:  [100-112] 100 (06/08 0636) Resp:  [20] 20 (06/08 0636) BP: (97-125)/(49-68) 97/56 mmHg (06/08 0636) SpO2:  [98 %-100 %] 100 % (06/08 0636) Weight:  [212 lb 1.3 oz (96.2 kg)] 212 lb 1.3 oz (96.2 kg) (06/08 0636) Last BM Date: 01/15/14 General:   Alert and oriented, pleasant Abdomen:  Bowel sounds present, soft, obese, non-tender, non-distended.  Neurologic:  Alert and  oriented x4;  grossly normal neurologically. Skin:  Weeping edema of extremities Psych:  Alert and cooperative. Normal mood and affect.  Intake/Output from previous day: 06/07 0701 - 06/08 0700 In: 720 [P.O.:720] Out: 1700 [Urine:1700] Intake/Output this shift:    Lab Results:  Recent Labs  01/16/14 1148 01/17/14 0437 01/18/14 0547  WBC  --  25.2* 10.9*  HGB 9.1* 10.0* 7.6*  HCT 26.8* 30.3* 22.8*  PLT  --  413* 261   BMET  Recent Labs  01/17/14 0437 01/18/14 0547  NA 134* 134*  K 3.7 3.8  CL 103 103  CO2 22 21  GLUCOSE 94 106*  BUN 57* 65*  CREATININE 0.92 0.96  CALCIUM 9.8 9.4     Assessment: 80 year old female admitted with acute renal failure, UTI, sepsis. CT reviewed considerable omental portosystemic varices/shunting with question of portal venous hypertension. Elastography completed with no fibrosis apparent. Unclear the significance of CT findings.   GI bleed this admission with moderate sized burgundy stools in the setting of Xarelto, which was stopped. Last Hgb 7.6 on 6/7, with blood draws on hold due to plans for hospice.Suspect either diverticular bleed (H/O duodenal diverticula and colonic) or hemorrhoidal.     H.pylori serology strongly positive, with generic Prevpac treatment started 5/29. Continue for 14 days total  Due to multiple medical issues,  patient desires hospice. Appears a meeting was conducted with patient and 3 daughters over the weekend; care management has been consulted.    Plan: Finish treatment for H.pylori, total of 14 days PPI BID until H.pylori treatment completed, then once daily.  Will sign off due to desire for hospice; please notify if further GI involvement desired.    Orvil Feil, ANP-BC [facility]       LOS: 11 days    01/19/2014, 7:55 AM

## 2014-01-20 MED ORDER — PANTOPRAZOLE SODIUM 40 MG PO TBEC: 40.0000 mg | DELAYED_RELEASE_TABLET | Freq: Two times a day (BID) | ORAL | Status: AC

## 2014-01-20 MED ORDER — TRAMADOL HCL 50 MG PO TABS: 50.0000 mg | ORAL_TABLET | Freq: Four times a day (QID) | ORAL | Status: AC | PRN

## 2014-01-20 MED ORDER — FUROSEMIDE 40 MG PO TABS: 40.0000 mg | ORAL_TABLET | Freq: Two times a day (BID) | ORAL | Status: AC | PRN

## 2014-01-20 MED ORDER — ONDANSETRON HCL 4 MG PO TABS: 4.0000 mg | ORAL_TABLET | Freq: Four times a day (QID) | ORAL | Status: AC | PRN

## 2014-01-20 MED ORDER — ACETAMINOPHEN 325 MG PO TABS: 650.0000 mg | ORAL_TABLET | Freq: Four times a day (QID) | ORAL | Status: AC | PRN

## 2014-01-20 MED ORDER — AMOXICILLIN 500 MG PO CAPS: 1000.0000 mg | ORAL_CAPSULE | Freq: Two times a day (BID) | ORAL | Status: AC

## 2014-01-20 MED ORDER — ALBUTEROL SULFATE (2.5 MG/3ML) 0.083% IN NEBU: 2.5000 mg | INHALATION_SOLUTION | RESPIRATORY_TRACT | Status: AC | PRN

## 2014-01-20 MED ORDER — BOOST / RESOURCE BREEZE PO LIQD: 1.0000 | Freq: Three times a day (TID) | ORAL | Status: AC

## 2014-01-20 MED ORDER — OXYCODONE-ACETAMINOPHEN 5-325 MG PO TABS: 1.0000 | ORAL_TABLET | ORAL | Status: AC | PRN

## 2014-01-20 MED ORDER — HYDROCORTISONE ACETATE 25 MG RE SUPP: 25.0000 mg | Freq: Two times a day (BID) | RECTAL | Status: AC

## 2014-01-20 MED ORDER — CLARITHROMYCIN 500 MG PO TABS: 500.0000 mg | ORAL_TABLET | Freq: Two times a day (BID) | ORAL | Status: AC

## 2014-01-20 NOTE — Clinical Social Work Note (Signed)
Pt d/c today home with hospice. CSW arranged transport via [facility]. CSW confirmed home address with pt's daughter Fraser Din and she reports family will be home upon arrival. No other needs reported.   Benay Pike, [place]

## 2014-01-20 NOTE — Discharge Summary (Signed)
Physician Discharge Summary  Holly Sloan IEP:[id number] DOB: 05-12-1929 DOA: 01/08/2014  PCP: [name], MD  Admit date: 01/08/2014 Discharge date: 01/20/2014  Time spent: 70 minutes  Recommendations for Outpatient Follow-up:  1. Patient discharged home with hospice. Followup with [name], MD in 1-2 week.  Discharge Diagnoses:  Principal Problem:   Sepsis Active Problems:   New onset a-fib   HYPERTENSION, PULMONARY   DVT   UTI (lower urinary tract infection)   Hypothyroidism   GIB (gastrointestinal bleeding)   Avascular necrosis of bone of left hip   Failure to thrive   Malnutrition   Diastolic CHF   HTN (hypertension)   High anion gap metabolic acidosis   Acute renal failure   Protein-calorie malnutrition, severe   H. pylori infection   Discharge Condition: Stable  Diet recommendation: Regular/comfort feeds  Filed Weights   01/16/14 0642 01/18/14 0634 01/19/14 0636  Weight: 97.7 kg (215 lb 6.2 oz) 98.3 kg (216 lb 11.4 oz) 96.2 kg (212 lb 1.3 oz)    History of present illness:  Holly Sloan is a 80 y.o. female with a Past Medical History of chronic diastolic heart failure, history of DVT x2-status post recent IVC filter placement, history of left hip fracture in [id number] complicated by avascular necrosis and is thought to require a total hip replacement which is scheduled for sometime later this year who presents today with the above noted complaint. Apparently ever since discharge from [facility] last week after she had a IVC filter placed, patient has had numerous episodes of nausea along with vomiting and diarrhea. Her diarrhea seems to have resolved today. She currently lives in a skilled nursing facility, and was noted to have extremely elevated blood pressure with a systolic in the 601U range along with the above-noted symptoms, she was then transferred to [facility] for further evaluation. In the ED, patient was noted to have acute renal  failure, very low albumin levels, along with UTI. Her blood pressure slowly has started to drop, during my evaluation it was in the 80 systolic range, patient however appeared asymptomatic.  Per history obtained from the patient and daughter, patient has had steady decline over the past few months, she is now essentially nonambulatory because of left hip pain, she's had decrease in appetite going on for the past few months. Patient and her family realize that patient is clearly failing to thrive, as well as the overall poor prognosis.   Hospital Course:  1. Suspected sepsis with hypotension, lactic acidosis, AG metabolic acidosis on admission, secondary to UTI. Patient was pan cultured and placed empirically on IV vancomycin IV cefepime. Urine cultures were obtained which grew out Citrobacter freundii and Klebsiella pneumonia. Patient was subsequently transitioned to oral Ceftin and complete a one-week course 1 about a therapy. Patient was also maintained on IV stress dose steroids which was subsequently tapered off. Patient had requested comfort measures no further procedures. Patient discharged home with hospice.  2. Hypotension, persistant. On admission patient was noted to be hypotensive which was felt to be secondary to infectious etiology. Urinalysis which was done was consistent with a urinary tract infection and patient was started empirically on IV vancomycin and cefepime. Patient was also placed on IV fluids. Cortisol levels which were obtained were within normal limits. Patient was started on stress dose IV steroids with improvement in her hypotension. Patient was also treated empirically with antibiotics. Cortisol levels and troponin were normal. IV stress dose steroids were tapered  off patient's blood pressure remained stable. Patient's systolic blood pressures remain in the low 100s by day of discharge.  3. New onset atrial fibrillation: May be secondary to patient's anemia/GI bleed and current  medical illness. Patient asymptomatic. Patient is wanting to be transitioned to comfort care and as such, no further workup was done.  4. UTI CITROBACTER FREUNDII and Klebsiella pneumonaie. Patient had presented with sepsis felt to be secondary to urinary tract infection. Urine cultures which were obtained grew out Citrobacter freundii and Klebsiella pneumoniae. On admission patient was placed on IV vancomycin IV cefepime and subsequently transitioned to oral Ceftin once culture results were back. Patient received a total of 7 days of antibiotic treatment.  5. Acute renal failure. Likely secondary to vomiting, diarrhea, sepsis, diuretics. Patient was gently hydrated during the hospitalization and treated empirically with IV antibiotics for his sepsis. Patient's acute renal failure had resolved by day of discharge.  6. Anasarca secondary to longstanding hypoalbuminemia from chronic disease, severe. Evaluated by oncology in the past.  7. AG-metabolic acidosis suspect secondary to lactic acidosis secondary to sepsis. Resolved. 8. Nausea, vomiting, intermittent diarrhea for 5-6 days with intermittent lower abdominal pain. None since admission. No acute findings on CT to explain. 9. Considerable omental portosystemic varices/shunting indicating portal venous hypertension. Etiology unclear. Abdominal ultrasound which was done did not show any fibrosis. No further workup needed at this time per GI.  10. Failure to thrive. Fair intake during this hospitalization. 11. Chronic diastolic congestive heart failure.  12. History of DVT x2, most recently 10/2013, status post IVC filter placement 5/21, off Xarelto. During the hospitalization, patient was noted to have some rectal bleeding and a such xarelto was discontinued. Patient was placed on IV heparin to prevent IVC clot from clotting off. Patient had requested no further procedures and requested comfort measures and a such will be discharged home with hospice.  Patient will be discharged OFF anticoagulation.   13. History of rectal bleeding 10/2013. During the hospitalization patient was noted to have rectal bleeding. Patient had been on xarelto prior to admission for DVTs. Patient was status post recent IVC filter placement. Anticoagulation was discontinued and patient was placed on IV heparin to help prevent clotting of the IVC filter. This was monitored and serial CBCs obtained. GI consultation was obtained and it was felt patient's bleeding was likely a lower GI bleed secondary to diverticular versus hemorrhoidal bleed. Patient was placed on Anusol suppositories with resolution of her rectal bleeding. Patient did not have any further rectal bleeding. Patient's hemoglobin and crit were down to 7.6. Patient requested no further procedures to be done and to be kept comfortable. Lab draws were discontinued. Patient remained asymptomatic. Patient be discharged in a stable condition OFF  anticoagulation. Patient's last hemoglobin was 7.6 on 01/18/2014. Outpatient followup with PCP. 14. History of left hip fracture 8315, complicated by avascular necrosis, total hip replacement being planned. 15. Dysphagia. Patient was initially maintained on a dysphagia 2 diet. However as patient had requested comfort measures and home with hospice her diet was liberalized to comfort feeds. 16. Severe malnutrition in context of chronic illness 17. H. pylori infection: June of admission patient was seen by gastroenterology secondary to abnormal CT scan and bleeding. H. pylori serology was obtained which was strongly positive. It was recommended by GI that patient undergo an H. pylori treatment for 2 weeks. Patient was subsequently started on amoxicillin, Biaxin and maintained on a PPI twice daily. Patient will be discharged home on 11 more  days of this therapy to complete a two-week course. 18. Leukocytosis: Likely steriod induced. Patient was treated empirically with IV antibiotics for  urinary tract infection which was noted on admission and felt to be the likely etiology of her sepsis. Patient was also placed on stress dose steroids secondary to hypotension. Patient's steroid dose were tapered down and subsequently discontinued. Patient's leukocytosis trending down and the white count on 01/18/2014 was 10.9.  19. Failure to thrive/prognosis: Patient has multiple medical issues and patient was noted to be in declining health over the past few months and was essentially nonambulatory secondary to avascular necrosis of the left hip. Patient had decreased appetite and on admission the family noted that she was clearly failing to thrive with poor oral prognosis. During the hospitalization patient had requested that no more further procedures be done and wanted to be kept comfortable. Family discussion was undertaken patient was subsequently made a DO NOT RESUSCITATE and patient had requested comfort measures, no transfusions, no further lab draws and to be discharged home with hospice. Hospice has been arranged and patient be discharged to with hospice and will followup with PCP as outpatient.  Procedures: 2-D echocardiogram: Left ventricular ejection fraction 55-60%. Grade 1 diastolic dysfunction.  Abdominal US 01/13/2014 CT abdomen and pelvis 01/08/2014  Consultations: Gastroenterology: Dr Gala Romney 01/12/2014   Discharge Exam: Filed Vitals:   01/20/14 1359  BP: 103/71  Pulse: 101  Temp: 98.5 F (36.9 C)  Resp: 20    General: NAD Cardiovascular: RRR Respiratory: Decreased BS in bases o/w clear.  Discharge Instructions You were cared for by a hospitalist during your hospital stay. If you have any questions about your discharge medications or the care you received while you were in the hospital after you are discharged, you can call the unit and asked to speak with the hospitalist on call if the hospitalist that took care of you is not available. Once you are discharged, your  primary care physician will handle any further medical issues. Please note that NO REFILLS for any discharge medications will be authorized once you are discharged, as it is imperative that you return to your primary care physician (or establish a relationship with a primary care physician if you do not have one) for your aftercare needs so that they can reassess your need for medications and monitor your lab values.      Discharge Instructions   Diet general    Complete by:  As directed      Discharge instructions    Complete by:  As directed   Follow up with [name], MD in 2 weeks.     Increase activity slowly    Complete by:  As directed             Medication List    STOP taking these medications       acidophilus Caps capsule     amLODipine 2.5 MG tablet  Commonly known as:  NORVASC     ascorbic acid 500 MG tablet  Commonly known as:  VITAMIN C     Chlorhexidine Gluconate 2 % Pads     cholecalciferol 1000 UNITS tablet  Commonly known as:  VITAMIN D     cyanocobalamin 500 MCG tablet     mupirocin ointment 2 %  Commonly known as:  BACTROBAN     potassium chloride SA 20 MEQ tablet  Commonly known as:  K-DUR,KLOR-CON     rivaroxaban 20 MG Tabs tablet  Commonly known as:  XARELTO     spironolactone 25 MG tablet  Commonly known as:  ALDACTONE      TAKE these medications       acetaminophen 325 MG tablet  Commonly known as:  TYLENOL  Take 2 tablets (650 mg total) by mouth every 6 (six) hours as needed for mild pain (or Fever >/= 101).     albuterol (2.5 MG/3ML) 0.083% nebulizer solution  Commonly known as:  PROVENTIL  Take 3 mLs (2.5 mg total) by nebulization every 2 (two) hours as needed for wheezing.     amoxicillin 500 MG capsule  Commonly known as:  AMOXIL  Take 2 capsules (1,000 mg total) by mouth 2 (two) times daily. Take for 11 days then stop.     clarithromycin 500 MG tablet  Commonly known as:  BIAXIN  Take 1 tablet (500 mg total) by mouth 2  (two) times daily. Take for 11 days then stop.     feeding supplement (PRO-STAT SUGAR FREE 64) Liqd  Take 30 mLs by mouth 4 (four) times daily.     feeding supplement (RESOURCE BREEZE) Liqd  Take 1 Container by mouth 3 (three) times daily between meals.     ferrous sulfate 325 (65 FE) MG tablet  Take 325 mg by mouth 2 (two) times daily with a meal.     furosemide 40 MG tablet  Commonly known as:  LASIX  Take 1 tablet (40 mg total) by mouth 2 (two) times daily as needed for fluid. Take 40mg  2 times daily as needed for shortness of breath.     gabapentin 300 MG capsule  Commonly known as:  NEURONTIN  Take 300 mg by mouth 3 (three) times daily.     hydrocortisone 25 MG suppository  Commonly known as:  ANUSOL-HC  Place 1 suppository (25 mg total) rectally 2 (two) times daily. Use for 11 days then stop.     levothyroxine 25 MCG tablet  Commonly known as:  SYNTHROID, LEVOTHROID  Take 25 mcg by mouth every morning.     ondansetron 4 MG tablet  Commonly known as:  ZOFRAN  Take 1 tablet (4 mg total) by mouth every 6 (six) hours as needed for nausea.     oxyCODONE-acetaminophen 5-325 MG per tablet  Commonly known as:  PERCOCET/ROXICET  Take 1-2 tablets by mouth every 4 (four) hours as needed for moderate pain or severe pain.     pantoprazole 40 MG tablet  Commonly known as:  PROTONIX  Take 1 tablet (40 mg total) by mouth 2 (two) times daily before a meal.     sennosides-docusate sodium 8.6-50 MG tablet  Commonly known as:  SENOKOT-S  Take 1 tablet by mouth 2 (two) times daily as needed for constipation.     traMADol 50 MG tablet  Commonly known as:  ULTRAM  Take 1 tablet (50 mg total) by mouth every 6 (six) hours as needed for moderate pain.       No Known Allergies Follow-up Information   Follow up with [name], MD. Schedule an appointment as soon as possible for a visit in 2 weeks.   Specialty:  Internal Medicine   Contact information:   [address] [place] [address] [phone]        The results of significant diagnostics from this hospitalization (including imaging, microbiology, ancillary and laboratory) are listed below for reference.    Significant Diagnostic Studies: Ct Abdomen Pelvis W Contrast  01/08/2014   CLINICAL DATA:  Abdominal pain.  Vomiting and diarrhea.  EXAM: CT ABDOMEN AND PELVIS WITH CONTRAST  TECHNIQUE: Multidetector CT imaging of the abdomen and pelvis was performed using the standard protocol following bolus administration of intravenous contrast.  CONTRAST:  26mL OMNIPAQUE IOHEXOL 300 MG/ML SOLN, 51mL OMNIPAQUE IOHEXOL 300 MG/ML SOLN  COMPARISON:  Multiple exams, including 02/20/2011 and 07/19/2010  FINDINGS: Both moderate bilateral pleural effusions with passive atelectasis. Elevated right hemidiaphragm. The liver, spleen, with adrenal glands, and pancreas appear unremarkable.  An infrarenal IVC filter is present. There has been further if flattening and severe a atrophy of the left kidney. There continues to be a partially calcified 2.6 x 1.8 cm left kidney upper pole mass, slightly reduced in size compared to the prior exam.  Diffuse subcutaneous and mesenteric edema. Portal vein and splenic vein appear patent.  Right kidney unremarkable. There is several mildly dilated scattered loops of small bowel, without a well-defined point of obstruction. Appendix not well seen. There is prominent sigmoid diverticulosis with scattered diverticula of the descending colon. Orally administered contrast makes its way through to the colon.  There appear to be numerous large diverticula in the small bowel, measuring up to 6 cm in diameter, with air-fluid levels. No extraluminal gas or extraluminal contrast.  Considerable omental portosystemic varices. Small amount of pelvic ascites. Presacral edema along with extensive subcutaneous edema in the lower pelvis.  Prior pinning of the left hip noted. There is severe osteoarthritis  along with articular surface irregularity along the left femoral head, along with a left hip effusion.  IMPRESSION: 1. Considerable third spacing of fluid with subcutaneous and mesenteric edema in addition to moderate bilateral pleural effusions and a small amount of ascites. 2. Considerable omental portosystemic varices/shunting indicating portal venous hypertension. 3. Numerous scattered diverticula of the small bowel, measuring up to 6 cm in diameter. I do not identify any of these is being inflamed. There is also descending and sigmoid colon diverticulosis. 4. Mildly elevated right hemidiaphragm. 5. Reduced size of the calcified left kidney upper pole mass, arguing against malignancy. The left kidney is severely atrophic and flattened. 6. Pinning of the left hip, with left hip superior cortical irregularity and severe left hip osteoarthritis. There is been some collapse of articular bone and and volume in the left femoral head, leading to joint exposure of one of the pin tips.   Electronically Signed   By: Sherryl Barters M.D.   On: 01/08/2014 17:54   Ir Venogram Renal Uni Right  01/01/2014   INDICATION: 80 year old female with a recent diagnosis of the lower extremity DVT (March of 2015). She has been anticoagulated on Xarelto. However, she is preparing for total hip arthroplasty secondary to avascular necrosis of the femoral head and must be off anticoagulation during the perioperative period. Temporary caval interruption is warranted for PE prophylaxis in the setting of recent DVT.  EXAM: ULTRASOUND GUIDANCE FOR VASCULARACCESS  IVC CATHETERIZATION AND VENOGRAM  IVC FILTER INSERTION  COMPARISON:  None.  MEDICATIONS: Fentanyl 25 mcg IV; Versed 0.5 mg IV  ANESTHESIA/SEDATION: Sedation Time  20 minutes  CONTRAST:  50 mL Omnipaque 300  FLUOROSCOPY TIME:  2 minutes 54 seconds  COMPLICATIONS: None immediate  PROCEDURE: Informed consent was obtained from the patient and her daughters following explanation of the  procedure, risks, benefits and alternatives. The patient understands, agrees and consents for the procedure. All questions were addressed. A time out was performed prior to the initiation of the procedure.  Maximal barrier sterile technique utilized including  caps, mask, sterile gowns, sterile gloves, large sterile drape, hand hygiene, and Betadine prep.  Under sterile condition and local anesthesia, right internal jugular venous access was performed with ultrasound. An ultrasound image was saved and sent to PACS. Over a guidewire, the IVC filter delivery sheath and inner dilator were advanced into the IVC just above the IVC bifurcation. Contrast injection was performed for an IVC venogram.  Venogram of a right accessory renal vein was also performed to ensure that the the filter was positioned below the accessory renal vein.  Through the delivery sheath, a retrievable Denali IVC filter was deployed below the level of the renal veins and above the IVC bifurcation. Limited post deployment venacavagram was performed.  The delivery sheath was removed and hemostasis was obtained with manual compression. A dressing was placed. The patient tolerated the procedure well without immediate post procedural complication.  FINDINGS: The IVC is patent. No evidence of thrombus, stenosis, or occlusion. Accessory right renal vein confirmed via selection of the accessory vein and venogram. Successful placement of the IVC filter below the level of the renal veins.  IMPRESSION: Successful ultrasound and fluoroscopically guided placement of an infrarenal retrievable IVC filter via right jugular approach.  Selective venography demonstrates an accessory right renal vein.  Signed,  Criselda Peaches, MD  [facility]  [facility]   Electronically Signed   By: Jacqulynn Cadet M.D.   On: 01/01/2014 17:05   Ir Ivc Filter Plmt / S&i /img Guid/mod Sed  01/01/2014   INDICATION: 80 year old  female with a recent diagnosis of the lower extremity DVT (March of 2015). She has been anticoagulated on Xarelto. However, she is preparing for total hip arthroplasty secondary to avascular necrosis of the femoral head and must be off anticoagulation during the perioperative period. Temporary caval interruption is warranted for PE prophylaxis in the setting of recent DVT.  EXAM: ULTRASOUND GUIDANCE FOR VASCULARACCESS  IVC CATHETERIZATION AND VENOGRAM  IVC FILTER INSERTION  COMPARISON:  None.  MEDICATIONS: Fentanyl 25 mcg IV; Versed 0.5 mg IV  ANESTHESIA/SEDATION: Sedation Time  20 minutes  CONTRAST:  50 mL Omnipaque 300  FLUOROSCOPY TIME:  2 minutes 54 seconds  COMPLICATIONS: None immediate  PROCEDURE: Informed consent was obtained from the patient and her daughters following explanation of the procedure, risks, benefits and alternatives. The patient understands, agrees and consents for the procedure. All questions were addressed. A time out was performed prior to the initiation of the procedure.  Maximal barrier sterile technique utilized including caps, mask, sterile gowns, sterile gloves, large sterile drape, hand hygiene, and Betadine prep.  Under sterile condition and local anesthesia, right internal jugular venous access was performed with ultrasound. An ultrasound image was saved and sent to PACS. Over a guidewire, the IVC filter delivery sheath and inner dilator were advanced into the IVC just above the IVC bifurcation. Contrast injection was performed for an IVC venogram.  Venogram of a right accessory renal vein was also performed to ensure that the the filter was positioned below the accessory renal vein.  Through the delivery sheath, a retrievable Denali IVC filter was deployed below the level of the renal veins and above the IVC bifurcation. Limited post deployment venacavagram was performed.  The delivery sheath was removed and hemostasis was obtained with manual compression. A dressing was placed.  The patient tolerated the procedure well without immediate post procedural complication.  FINDINGS: The IVC is patent. No evidence of thrombus, stenosis, or occlusion. Accessory right renal vein  confirmed via selection of the accessory vein and venogram. Successful placement of the IVC filter below the level of the renal veins.  IMPRESSION: Successful ultrasound and fluoroscopically guided placement of an infrarenal retrievable IVC filter via right jugular approach.  Selective venography demonstrates an accessory right renal vein.  Signed,  Criselda Peaches, MD  [facility]  [facility]   Electronically Signed   By: Jacqulynn Cadet M.D.   On: 01/01/2014 17:05   Dg Chest Port 1 View  01/15/2014   CLINICAL DATA:  Shortness of breath  EXAM: PORTABLE CHEST - 1 VIEW  COMPARISON:  01/08/2014  FINDINGS: The lungs are markedly hypoaerated with chronic elevation of the right hemidiaphragm. Heart size is normal. Trace pleural effusions are reidentified with central vascular congestion.  IMPRESSION: Cardiomegaly with central vascular congestion and trace effusions. Persistent low volumes.   Electronically Signed   By: Conchita Paris M.D.   On: 01/15/2014 19:28   Dg Chest Portable 1 View  01/08/2014   CLINICAL DATA:  Abdominal pain, nausea, vomiting, diarrhea, fatigue.  EXAM: PORTABLE CHEST - 1 VIEW  COMPARISON:  10/26/2013  FINDINGS: Lung volumes are low with chronic elevation of the right hemidiaphragm. There is no evidence of pulmonary edema, consolidation, pneumothorax, nodule or pleural fluid. The heart size and mediastinal contours are within normal limits. Visualized bony thorax is unremarkable.  IMPRESSION: Low lung volumes with elevation of the right hemidiaphragm. No acute findings.   Electronically Signed   By: Aletta Edouard M.D.   On: 01/08/2014 15:20   US Abdomen Complete W/elastography  01/13/2014   CLINICAL DATA:  Portosystemic varices on recent CT.  Evaluate for cul liver disease.  EXAM: ULTRASOUND ABDOMEN  ULTRASOUND HEPATIC ELASTOGRAPHY  TECHNIQUE: Sonography of the upper abdomen was perform, in addition, ultrasound elastography evaluation of the liver was performed. A region of interest was placed within the right lobe of the liver. Following application of a compressive sonographic pulse, shear waves were detected in the adjacent hepatic tissue and the shear wave velocity was calculated. Multiple assessments were performed at the selected site. Median shear wave velocity is correlated to a Metavir fibrosis score.  COMPARISON:  None.  CORRELATIVE IMAGING: CT 01/08/2014  FINDINGS: ULTRASOUND ABDOMEN  Gallbladder:  Limited visualization due to overlying bowel gas. There appears to be some sludge within the gallbladder. Wall appears mildly thickened at 5-6 mm. Negative sonographic Murphy's.  Common bile duct:  Diameter: Normal caliber, 4 mm  Liver:  Heterogeneous echotexture. Slightly nodular contours of the liver suggest cirrhosis. No focal abnormality.  IVC:  Not visualized due to body habitus and overlying bowel gas.  Pancreas:  Not visualized due to body habitus and overlying bowel gas.  Spleen:  Not visualized due to body habitus and overlying bowel gas.  Right Kidney:  Length: Limited visualization, 8.9 cm.  No hydronephrosis.  Left Kidney:  Length: Not visualized due to body habitus and overlying bowel gas.  Abdominal aorta:  Not visualized due to body habitus and overlying bowel gas  Other findings:  Right pleural effusion incidentally noted.  ULTRASOUND HEPATIC ELASTOGRAPHY  Hepatic Segment:  8  Number of measurements:  13  Median velocity:   0.91  m/sec  Corresponding Metavir fibrosis score:  F 0  Pertinent liver disease findings noted on other imaging exams: None noted or none available  Please note that abnormal shear wave velocities may also be identified in clinical settings other than with hepatic fibrosis, such as: Acute hepatitis,  elevated  right heart and central venous pressures, veno-occlusive disease (Budd-Chiari), infiltrative processes such as mastocytosis/amyloidosis/infiltrative tumor, extrahepatic cholestasis, in the post-prandial state, and liver transplantation. Correlation with patient history, laboratory data, and clinical condition recommended.  IMPRESSION: ULTRASOUND ABDOMEN: Very limited study due to body habitus and overlying bowel gas. The liver appears heterogeneous and contours appear mildly nodular suggesting the possibility of cirrhosis.  Limited visualization the gallbladder appears to demonstrates sludge and wall thickening. Negative sonographic Murphy's.  ULTRASOUND HEPATIC ELASTOGRAHY: Median hepatic shear wave velocity is calculated at 0.91 m/sec, which corresponds to a Metavir fibrosis score of F 0.   Electronically Signed   By: Rolm Baptise M.D.   On: 01/13/2014 12:34    Microbiology: No results found for this or any previous visit (from the past 240 hour(s)).   Labs: Basic Metabolic Panel:  Recent Labs Lab 01/14/14 0541 01/15/14 0520 01/16/14 0552 01/17/14 0437 01/18/14 0547  NA 135* 138 138 134* 134*  K 4.1 3.8 3.8 3.7 3.8  CL 104 107 105 103 103  CO2 20 18* 22 22 21   GLUCOSE 98 107* 99 94 106*  BUN 58* 57* 55* 57* 65*  CREATININE 1.03 0.92 0.90 0.92 0.96  CALCIUM 9.0 9.3 9.8 9.8 9.4   Liver Function Tests:  Recent Labs Lab 01/16/14 0552  AST 12  ALT 11  ALKPHOS 74  BILITOT <0.2*  PROT 3.3*  ALBUMIN 1.3*   No results found for this basename: LIPASE, AMYLASE,  in the last 168 hours No results found for this basename: AMMONIA,  in the last 168 hours CBC:  Recent Labs Lab 01/14/14 0541  01/15/14 1050 01/15/14 1634 01/16/14 0552 01/16/14 1148 01/17/14 0437 01/18/14 0547  WBC 13.1*  --  16.5*  --  14.8*  --  25.2* 10.9*  HGB 11.5*  < > 10.2* 9.7* 9.1* 9.1* 10.0* 7.6*  HCT 33.1*  < > 29.9* 29.8* 26.6* 26.8* 30.3* 22.8*  MCV 88.0  --  89.5  --  88.1  --  90.7 91.2  PLT 416*   --  368  --  324  --  413* 261  < > = values in this interval not displayed. Cardiac Enzymes: No results found for this basename: CKTOTAL, CKMB, CKMBINDEX, TROPONINI,  in the last 168 hours BNP: BNP (last 3 results)  Recent Labs  10/26/13 1701  PROBNP 672.3*   CBG: No results found for this basename: GLUCAP,  in the last 168 hours     Signed:  Eugenie Filler MD [facility] 01/20/2014, 3:27 PM

## 2014-01-20 NOTE — Progress Notes (Signed)
Patient being d/c home with hospice via EMS. IV cath removed and intact. No pain/swelling at site. Family at bedside and verbalize understanding of instructions. No c/o pain at this time.

## 2014-03-16 ENCOUNTER — Ambulatory Visit (HOSPITAL_COMMUNITY): Payer: Medicare HMO

## 2014-03-16 ENCOUNTER — Other Ambulatory Visit (HOSPITAL_COMMUNITY): Payer: Medicare HMO

## 2014-03-17 NOTE — Progress Notes (Signed)
This encounter was created in error - please disregard.

## 2014-06-14 DEATH — deceased

## 2015-05-23 IMAGING — CR DG WRIST COMPLETE 3+V*R*
4 series · 4 of 4 positions shown · non-contrast
Comparison: None.

CLINICAL DATA: Fall with pain

EXAM:
RIGHT WRIST - COMPLETE 3+ VIEW

[view not recorded (1 of 4)]
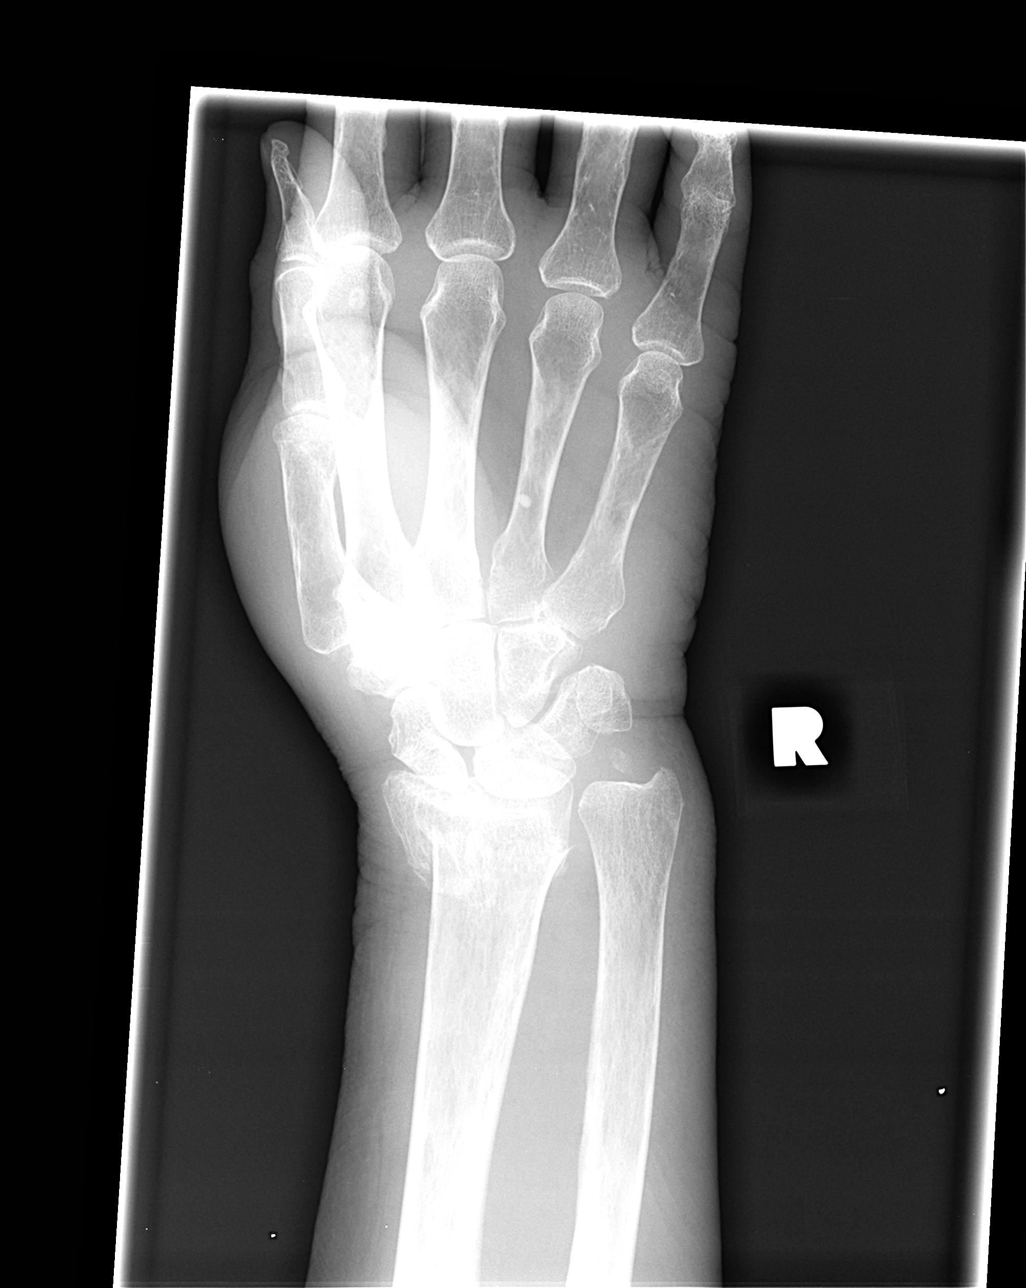

[view not recorded (2 of 4)]
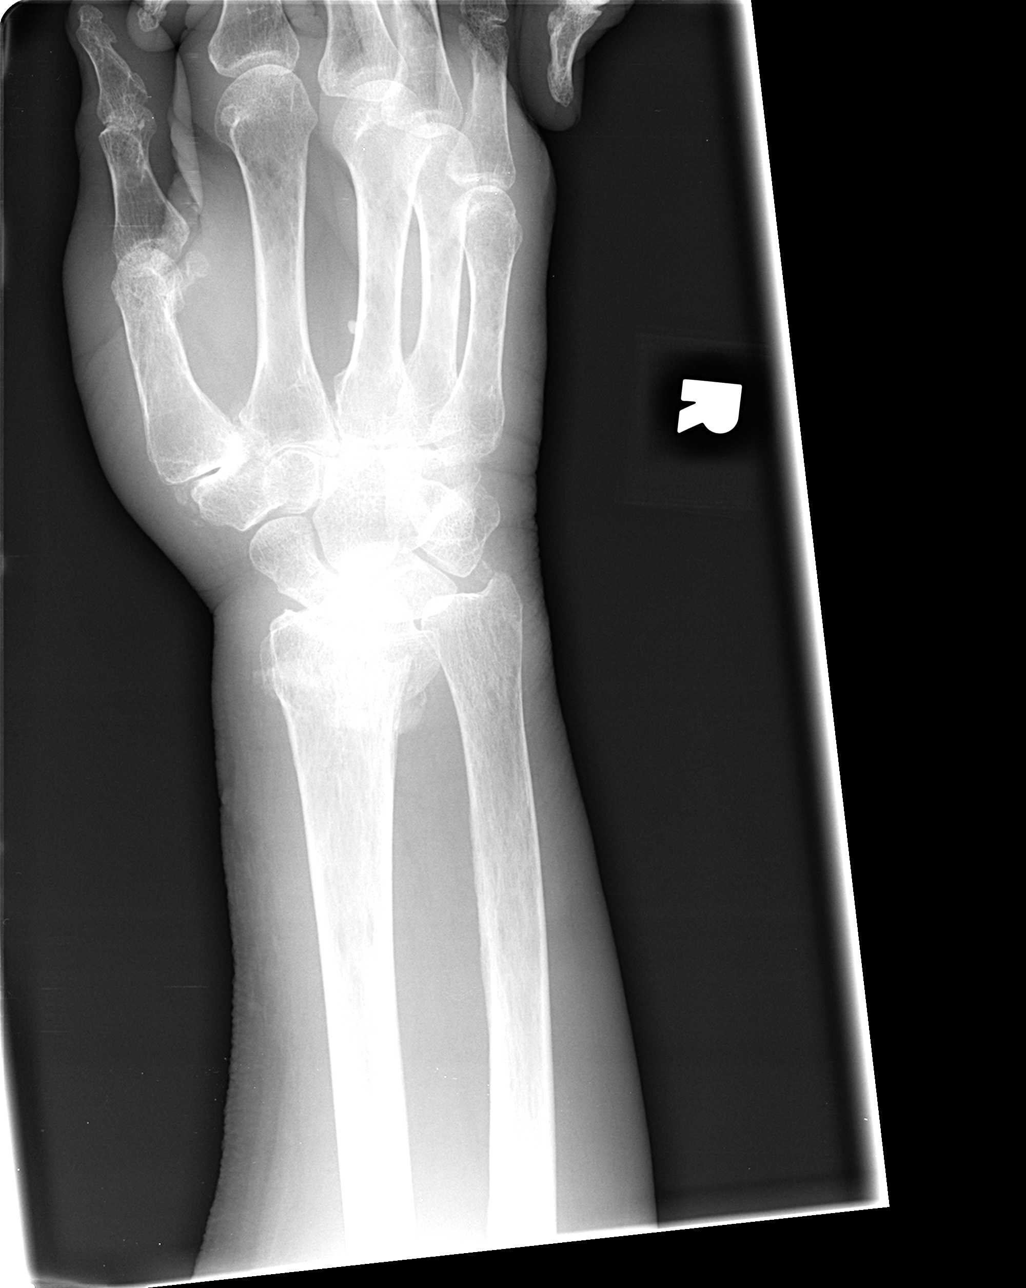

[view not recorded (3 of 4)]
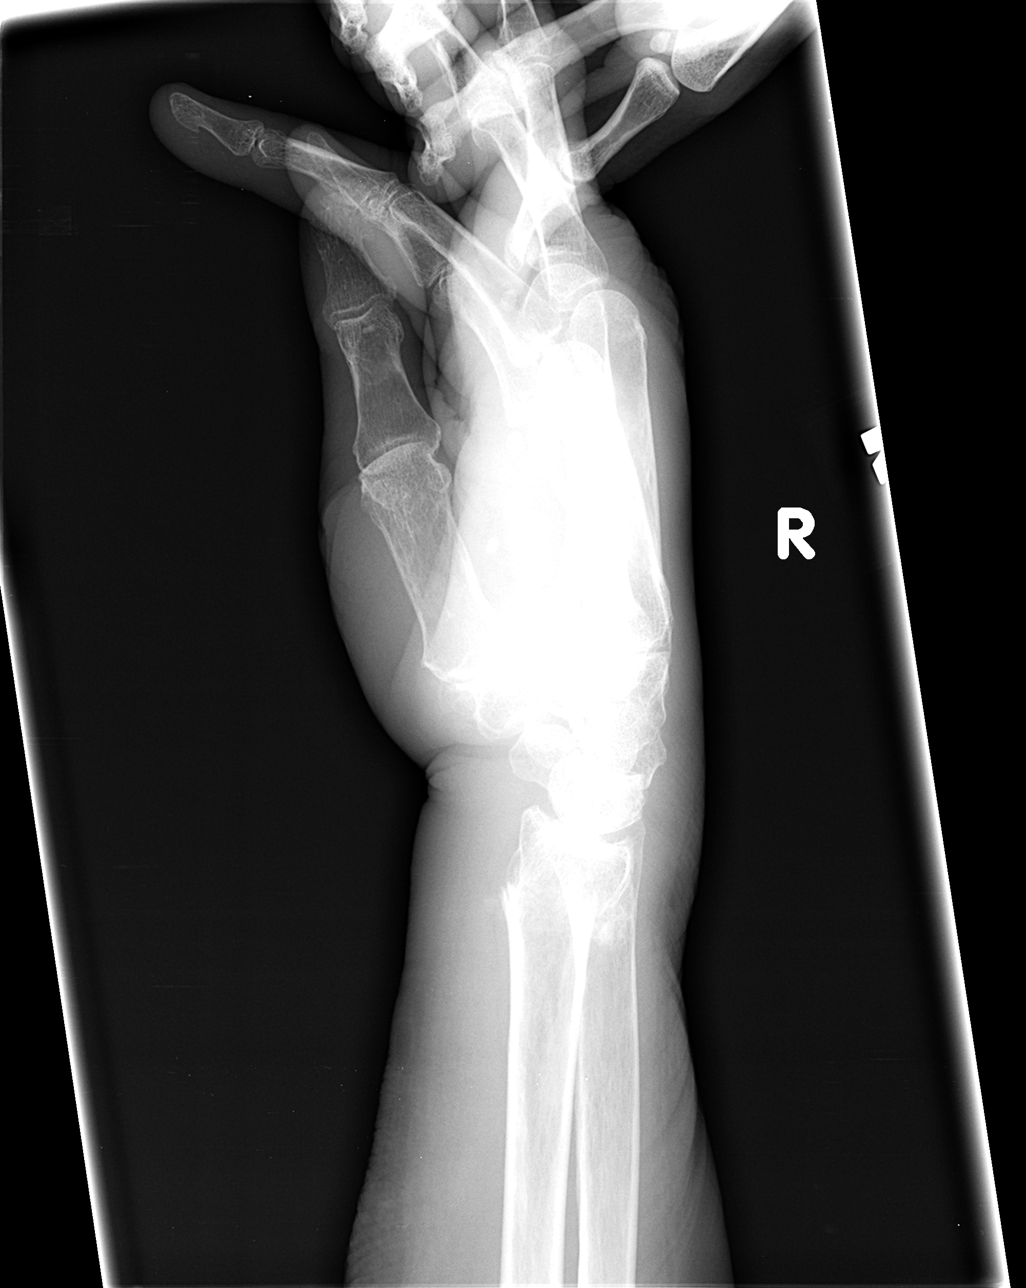

[view not recorded (4 of 4)]
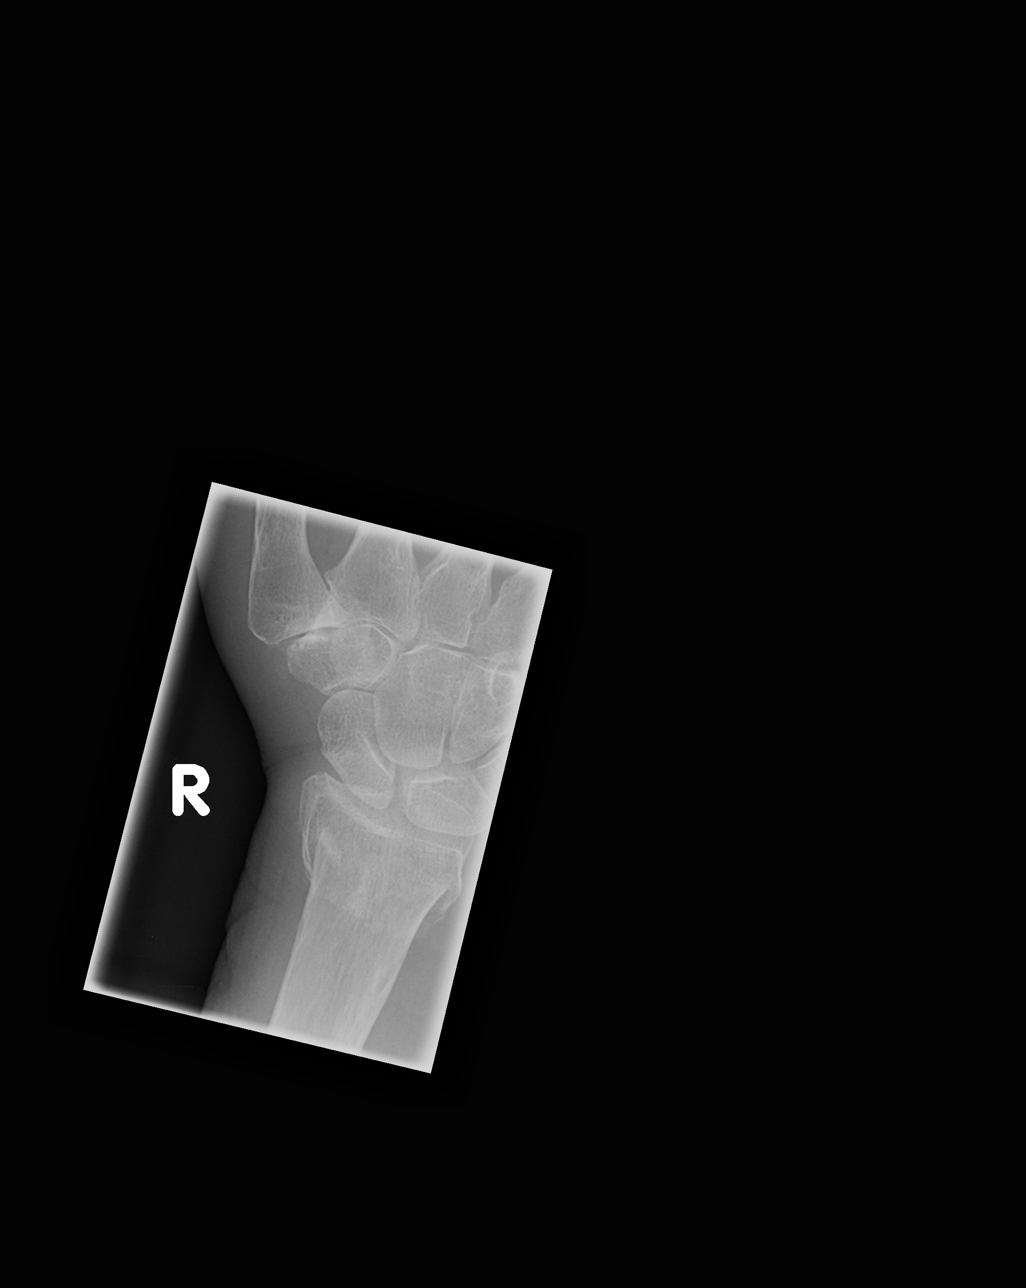

[4 of 4 positions shown; findings below may reference images not displayed]

FINDINGS: There is an acute transverse fracture through the distal radial
metaphysis, with posterior impaction causing dorsal tilting of the
wrist. Acquired ulnar positive variance. There is likely a fracture
plane extending to the radiocarpal joint, without articular surface
displacement. Distracted ulnar styloid process avulsion fracture.

Osteopenia.  First CMC osteoarthritis.
IMPRESSION: Displaced distal radius and ulna fractures, as above.

## 2015-05-23 IMAGING — CR DG ELBOW 2V*R*
4 series · 4 of 4 positions shown · non-contrast
Comparison: None.

CLINICAL DATA: Fall yesterday.  Elbow pain.

EXAM:
RIGHT ELBOW - 2 VIEW

[view not recorded (1 of 4)]
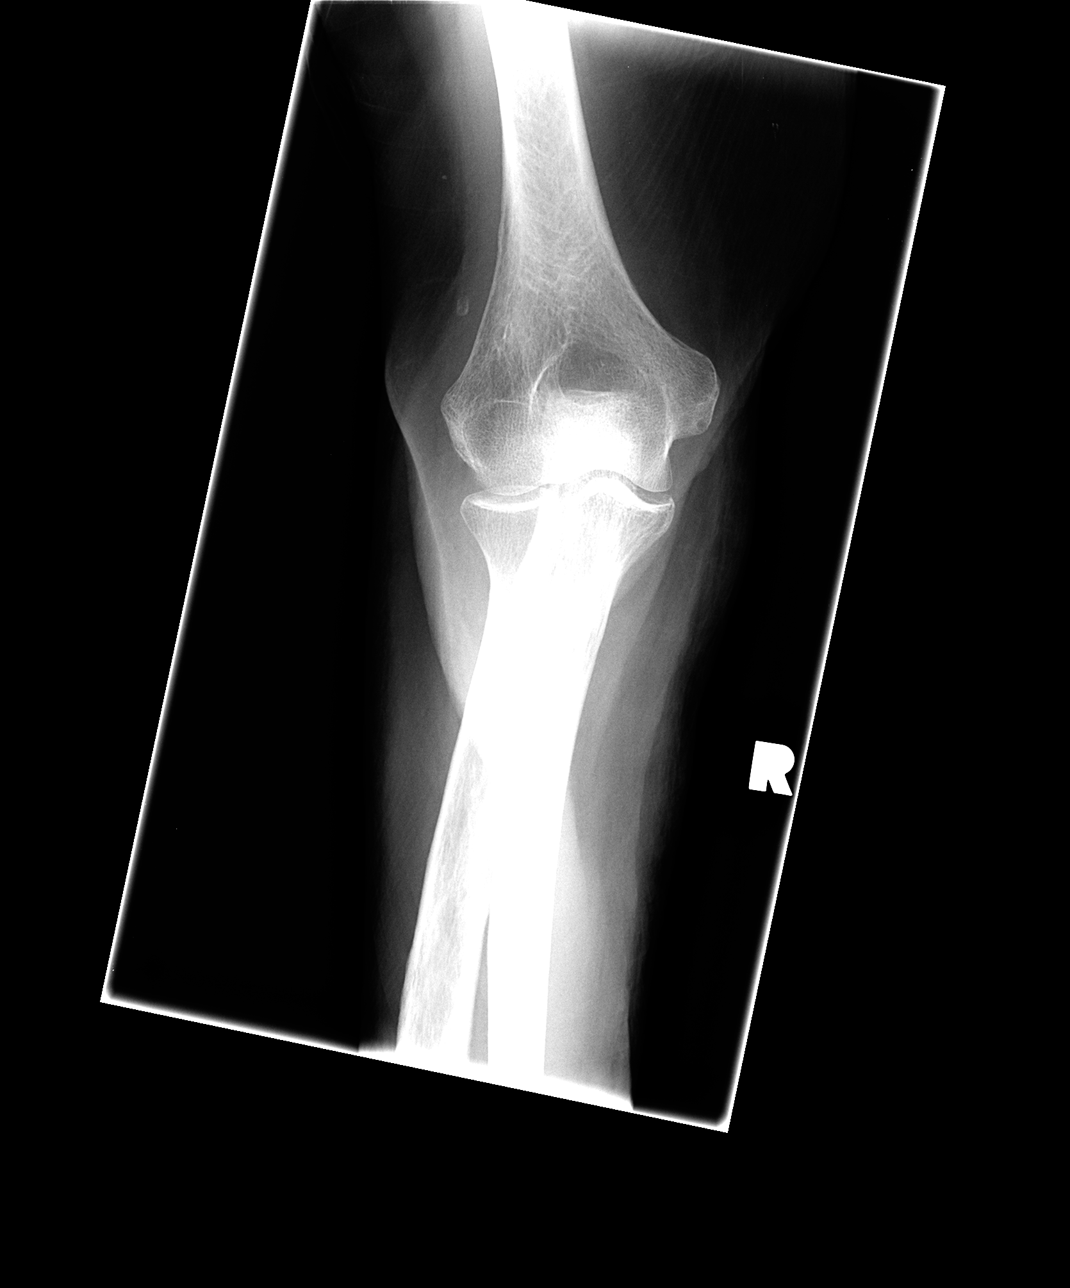

[view not recorded (2 of 4)]
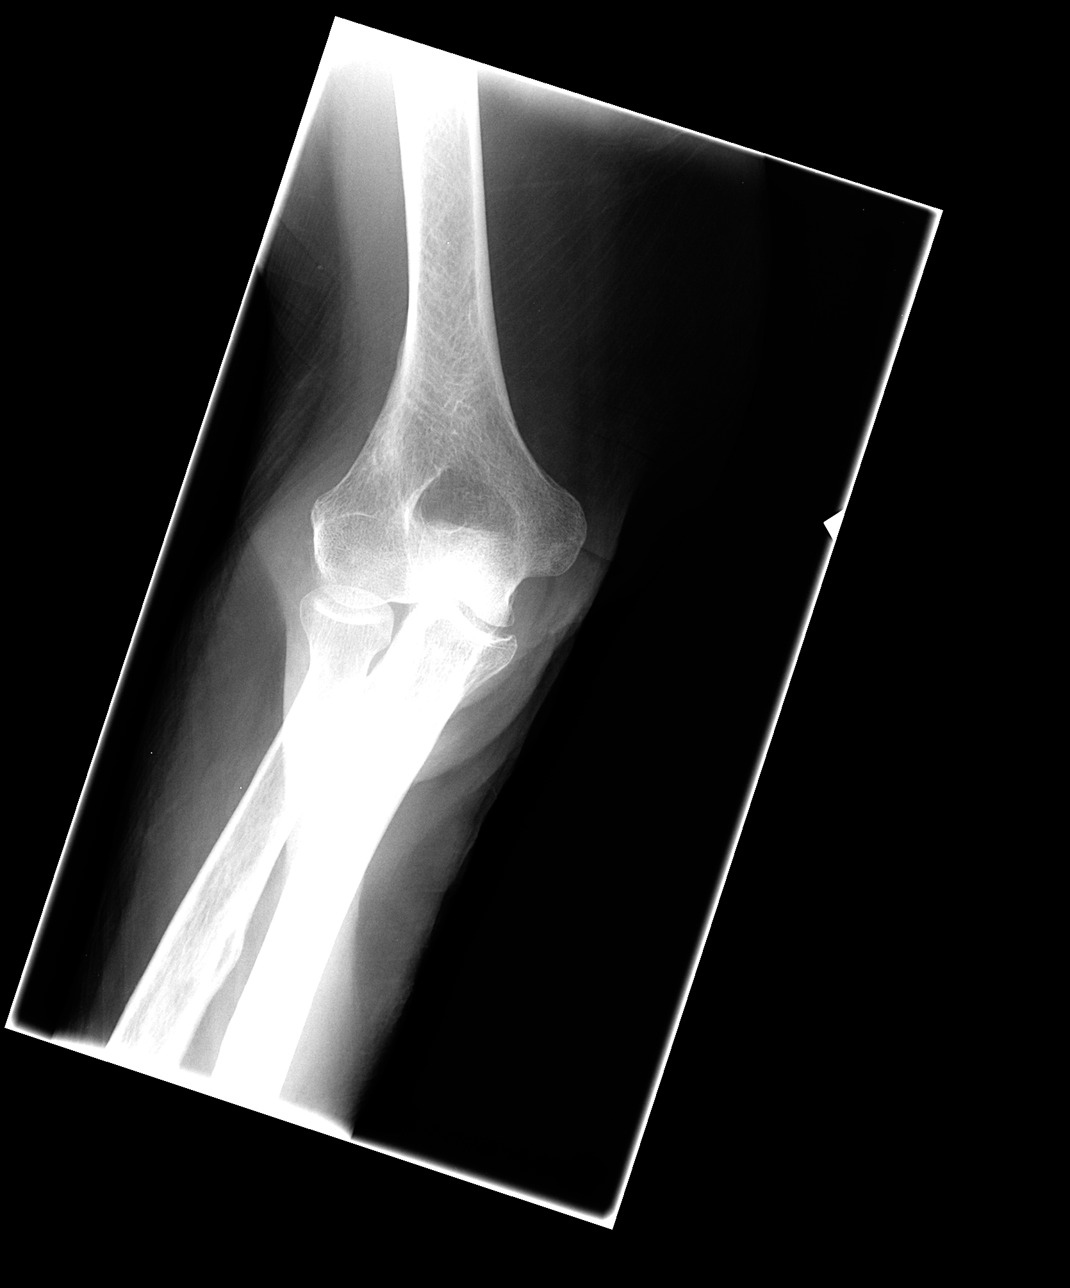

[view not recorded (3 of 4)]
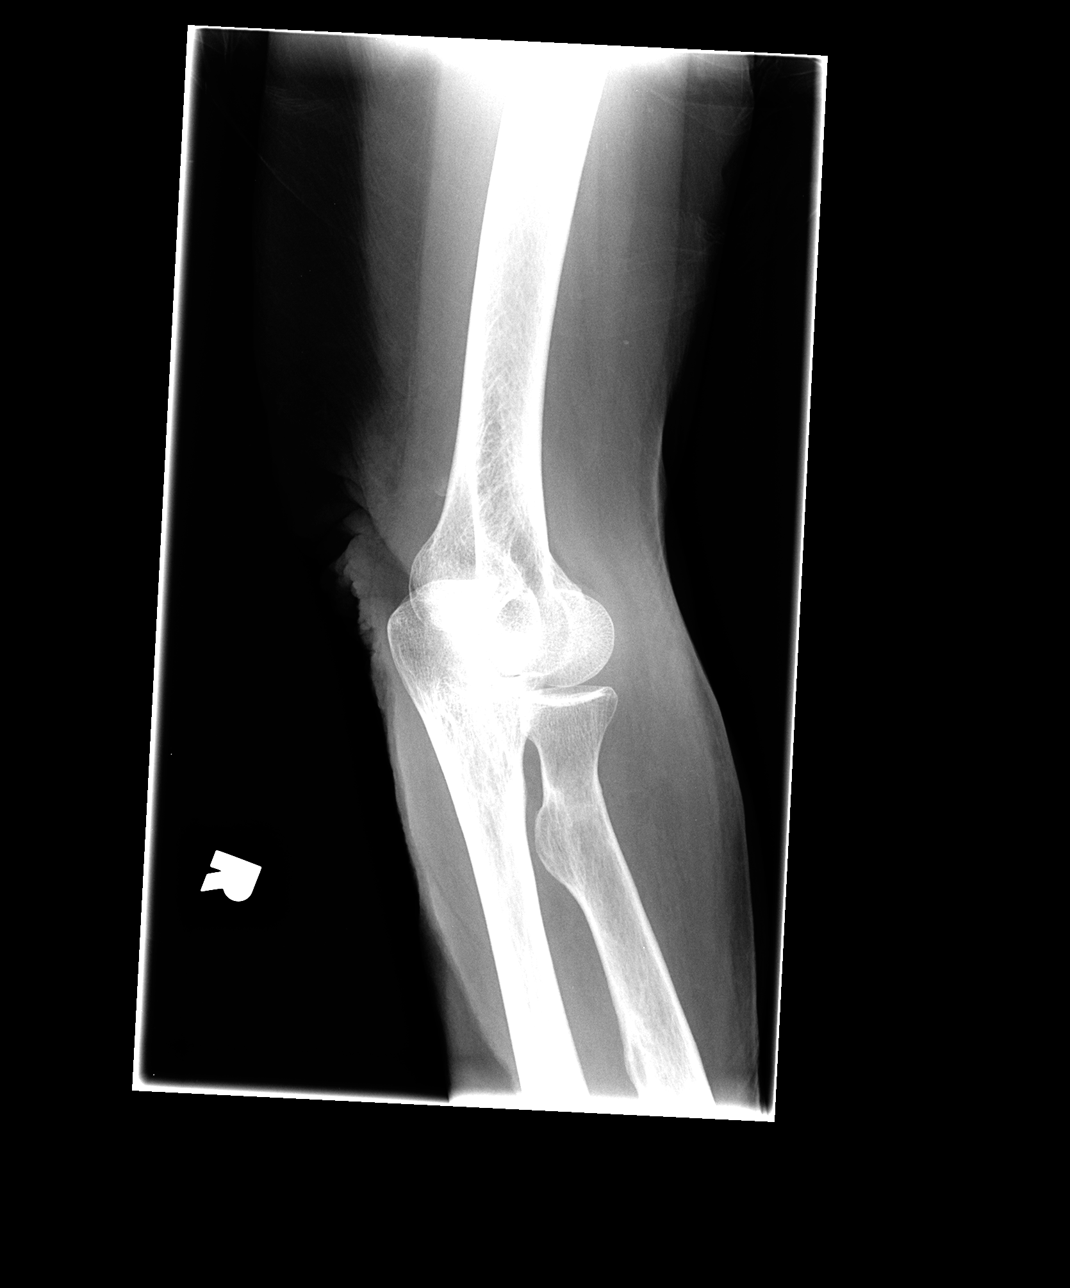

[view not recorded (4 of 4)]
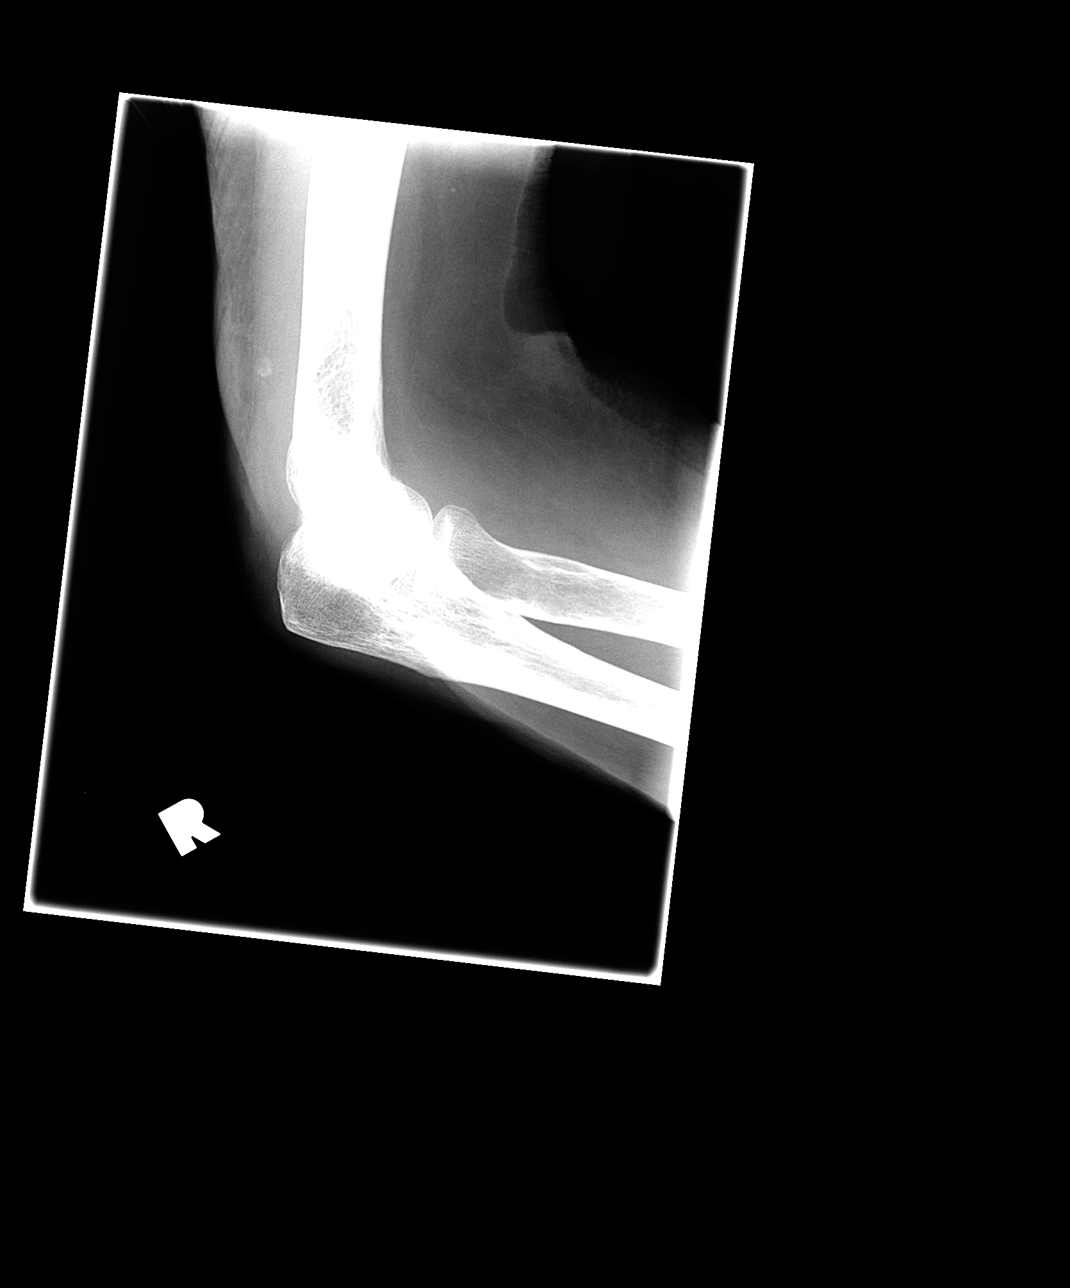

[4 of 4 positions shown; findings below may reference images not displayed]

FINDINGS: No effusion or fracture. Normal joint alignment. Osteopenia. No
focal bone lesion.
IMPRESSION: No acute osseous abnormality.

## 2015-05-31 IMAGING — CR DG CHEST 1V PORT
1 series · 1 of 1 positions shown · non-contrast
Comparison: 05/22/2011

CLINICAL DATA: RECTAL BLEEDING.  WEAKNESS.

EXAM:
PORTABLE CHEST - 1 VIEW

[portable]
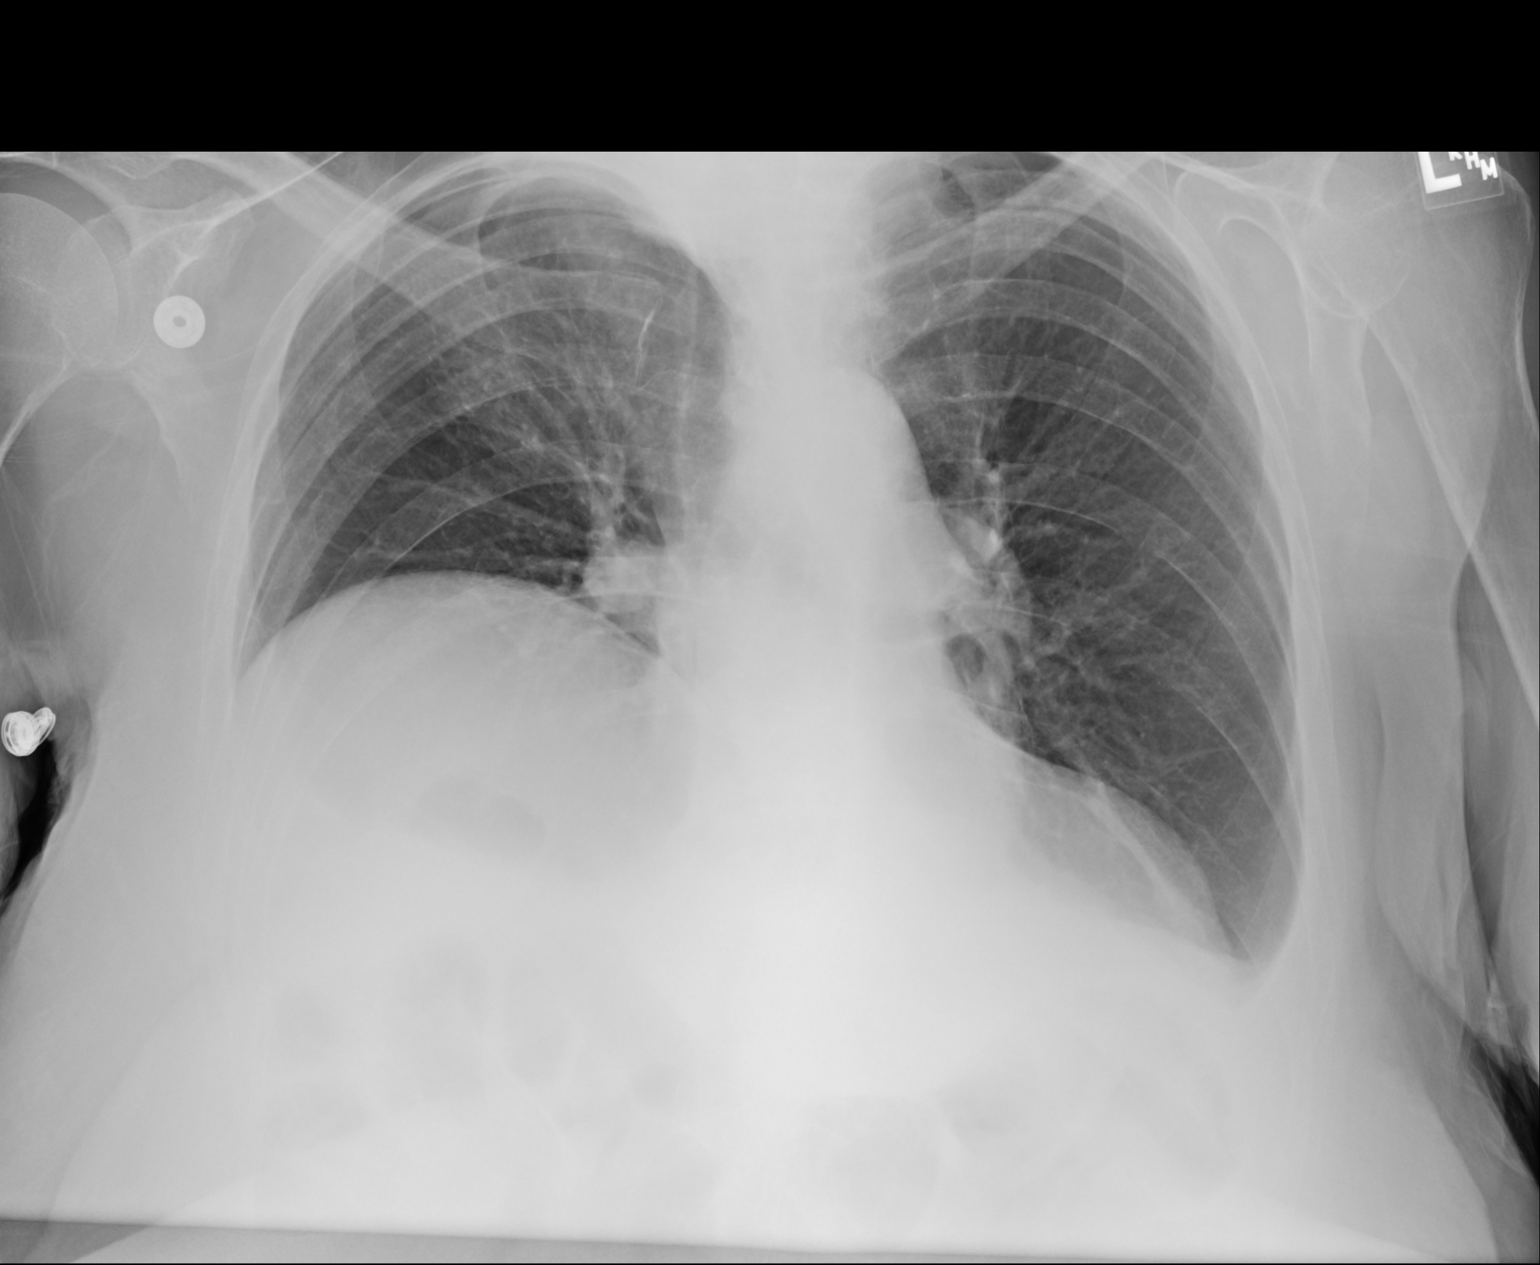

[1 of 1 positions shown; findings below may reference images not displayed]

FINDINGS: Heart size and pulmonary vascularity are normal. There is chronic
elevation of the right hemidiaphragm. There appear to be new small
bilateral pleural effusions. No infiltrates. No acute osseous
abnormality.
IMPRESSION: New small bilateral pleural effusions.

## 2016-01-28 ENCOUNTER — Other Ambulatory Visit: Payer: Self-pay | Admitting: Nurse Practitioner
